# Patient Record
Sex: Male | Born: 1935 | Race: White | Hispanic: No | Smoking: Former smoker
Health system: Southern US, Community
[De-identification: ages and names within clinical notes are randomized; demographics above are authoritative.]

## PROBLEM LIST (undated history)

## (undated) DIAGNOSIS — C61 Malignant neoplasm of prostate: Secondary | ICD-10-CM

## (undated) DIAGNOSIS — J189 Pneumonia, unspecified organism: Secondary | ICD-10-CM

## (undated) DIAGNOSIS — J449 Chronic obstructive pulmonary disease, unspecified: Secondary | ICD-10-CM

## (undated) DIAGNOSIS — E785 Hyperlipidemia, unspecified: Secondary | ICD-10-CM

## (undated) DIAGNOSIS — C801 Malignant (primary) neoplasm, unspecified: Secondary | ICD-10-CM

## (undated) DIAGNOSIS — E7800 Pure hypercholesterolemia, unspecified: Secondary | ICD-10-CM

## (undated) DIAGNOSIS — H9190 Unspecified hearing loss, unspecified ear: Secondary | ICD-10-CM

## (undated) DIAGNOSIS — N3281 Overactive bladder: Secondary | ICD-10-CM

## (undated) DIAGNOSIS — I10 Essential (primary) hypertension: Secondary | ICD-10-CM

## (undated) DIAGNOSIS — E236 Other disorders of pituitary gland: Secondary | ICD-10-CM

## (undated) DIAGNOSIS — I639 Cerebral infarction, unspecified: Secondary | ICD-10-CM

## (undated) HISTORY — PX: BLADDER SURGERY: SHX569

## (undated) HISTORY — PX: CATARACT EXTRACTION: SUR2

## (undated) HISTORY — DX: Unspecified hearing loss, unspecified ear: H91.90

## (undated) HISTORY — DX: Cerebral infarction, unspecified: I63.9

## (undated) HISTORY — PX: TRANSURETHRAL RESECTION OF PROSTATE: SHX73

## (undated) HISTORY — PX: EYE SURGERY: SHX253

---

## 2002-05-02 ENCOUNTER — Emergency Department: Admit: 2002-05-02 | Payer: Self-pay | Source: Emergency Department | Admitting: Emergency Medicine

## 2014-06-09 ENCOUNTER — Inpatient Hospital Stay: Payer: Medicare Other | Admitting: Internal Medicine

## 2014-06-09 ENCOUNTER — Emergency Department: Payer: Medicare Other

## 2014-06-09 ENCOUNTER — Inpatient Hospital Stay: Payer: Medicare Other

## 2014-06-09 ENCOUNTER — Inpatient Hospital Stay
Admission: EM | Admit: 2014-06-09 | Discharge: 2014-06-17 | DRG: 871 | Disposition: A | Discharge status: Skilled Nursing Facility | Payer: Medicare Other | Attending: Internal Medicine | Admitting: Internal Medicine

## 2014-06-09 ENCOUNTER — Encounter
Admission: EM | Disposition: A | Discharge status: Skilled Nursing Facility | Payer: Self-pay | Source: Home / Self Care | Attending: Internal Medicine

## 2014-06-09 DIAGNOSIS — E785 Hyperlipidemia, unspecified: Secondary | ICD-10-CM | POA: Diagnosis present

## 2014-06-09 DIAGNOSIS — J9690 Respiratory failure, unspecified, unspecified whether with hypoxia or hypercapnia: Secondary | ICD-10-CM | POA: Diagnosis present

## 2014-06-09 DIAGNOSIS — J851 Abscess of lung with pneumonia: Secondary | ICD-10-CM

## 2014-06-09 DIAGNOSIS — D72829 Elevated white blood cell count, unspecified: Secondary | ICD-10-CM

## 2014-06-09 DIAGNOSIS — G9389 Other specified disorders of brain: Secondary | ICD-10-CM

## 2014-06-09 DIAGNOSIS — A491 Streptococcal infection, unspecified site: Secondary | ICD-10-CM

## 2014-06-09 DIAGNOSIS — J168 Pneumonia due to other specified infectious organisms: Secondary | ICD-10-CM

## 2014-06-09 DIAGNOSIS — A419 Sepsis, unspecified organism: Principal | ICD-10-CM | POA: Diagnosis present

## 2014-06-09 DIAGNOSIS — J90 Pleural effusion, not elsewhere classified: Secondary | ICD-10-CM

## 2014-06-09 DIAGNOSIS — E876 Hypokalemia: Secondary | ICD-10-CM | POA: Diagnosis present

## 2014-06-09 DIAGNOSIS — J189 Pneumonia, unspecified organism: Secondary | ICD-10-CM

## 2014-06-09 DIAGNOSIS — D638 Anemia in other chronic diseases classified elsewhere: Secondary | ICD-10-CM | POA: Diagnosis present

## 2014-06-09 DIAGNOSIS — J9691 Respiratory failure, unspecified with hypoxia: Secondary | ICD-10-CM | POA: Diagnosis present

## 2014-06-09 DIAGNOSIS — B954 Other streptococcus as the cause of diseases classified elsewhere: Secondary | ICD-10-CM | POA: Diagnosis present

## 2014-06-09 DIAGNOSIS — J154 Pneumonia due to other streptococci: Secondary | ICD-10-CM | POA: Diagnosis present

## 2014-06-09 DIAGNOSIS — R410 Disorientation, unspecified: Secondary | ICD-10-CM

## 2014-06-09 DIAGNOSIS — J869 Pyothorax without fistula: Secondary | ICD-10-CM | POA: Diagnosis present

## 2014-06-09 DIAGNOSIS — N179 Acute kidney failure, unspecified: Secondary | ICD-10-CM | POA: Diagnosis present

## 2014-06-09 HISTORY — DX: Hyperlipidemia, unspecified: E78.5

## 2014-06-09 LAB — COMPREHENSIVE METABOLIC PANEL
ALT: 21 U/L (ref 0–55)
AST (SGOT): 32 U/L (ref 5–34)
Albumin/Globulin Ratio: 0.3 — ABNORMAL LOW (ref 0.9–2.2)
Albumin: 1.6 g/dL — ABNORMAL LOW (ref 3.5–5.0)
Alkaline Phosphatase: 144 U/L — ABNORMAL HIGH (ref 38–106)
Anion Gap: 16 — ABNORMAL HIGH (ref 5.0–15.0)
BUN: 39.8 mg/dL — ABNORMAL HIGH (ref 9.0–28.0)
Bilirubin, Total: 1.9 mg/dL — ABNORMAL HIGH (ref 0.2–1.2)
CO2: 22 mEq/L (ref 22–29)
Calcium: 8 mg/dL (ref 7.9–10.2)
Chloride: 102 mEq/L (ref 100–111)
Creatinine: 1.1 mg/dL (ref 0.7–1.3)
Globulin: 4.8 g/dL — ABNORMAL HIGH (ref 2.0–3.6)
Glucose: 115 mg/dL — ABNORMAL HIGH (ref 70–100)
Potassium: 4.2 mEq/L (ref 3.5–5.1)
Protein, Total: 6.4 g/dL (ref 6.0–8.3)
Sodium: 140 mEq/L (ref 136–145)

## 2014-06-09 LAB — MAN DIFF ONLY
Band Neutrophils Absolute: 3.57 10*3/uL — ABNORMAL HIGH (ref 0.00–1.00)
Band Neutrophils: 19 %
Basophils Absolute Manual: 0 10*3/uL (ref 0.00–0.20)
Basophils Manual: 0 %
Eosinophils Absolute Manual: 0 10*3/uL (ref 0.00–0.70)
Eosinophils Manual: 0 %
Lymphocytes Absolute Manual: 0.56 10*3/uL (ref 0.50–4.40)
Lymphocytes Manual: 3 %
Monocytes Absolute: 0.19 10*3/uL (ref 0.00–1.20)
Monocytes Manual: 1 %
Neutrophils Absolute Manual: 14.46 10*3/uL — ABNORMAL HIGH (ref 1.80–8.10)
Nucleated RBC: 0 /100 WBC (ref 0–1)
Segmented Neutrophils: 77 %

## 2014-06-09 LAB — URINALYSIS WITH MICROSCOPIC
Glucose, UA: NEGATIVE
Ketones UA: NEGATIVE
Leukocyte Esterase, UA: NEGATIVE
Nitrite, UA: NEGATIVE
Specific Gravity UA: >1.035 — AB (ref 1.001–1.035)
Urine pH: 5.5 (ref 5.0–8.0)
Urobilinogen, UA: 2 mg/dL (ref 0.2–2.0)

## 2014-06-09 LAB — CBC AND DIFFERENTIAL
Hematocrit: 33.1 % — ABNORMAL LOW (ref 42.0–52.0)
Hgb: 11.2 g/dL — ABNORMAL LOW (ref 13.0–17.0)
MCH: 28.6 pg (ref 28.0–32.0)
MCHC: 33.8 g/dL (ref 32.0–36.0)
MCV: 84.4 fL (ref 80.0–100.0)
MPV: 9.4 fL (ref 9.4–12.3)
Platelets: 641 10*3/uL — ABNORMAL HIGH (ref 140–400)
RBC: 3.92 10*6/uL — ABNORMAL LOW (ref 4.70–6.00)
RDW: 16 % — ABNORMAL HIGH (ref 12–15)
WBC: 18.78 10*3/uL — ABNORMAL HIGH (ref 3.50–10.80)

## 2014-06-09 LAB — CELL MORPHOLOGY
Cell Morphology: ABNORMAL — AB
Platelet Estimate: INCREASED — AB

## 2014-06-09 LAB — TROPONIN I: Troponin I: <0.01 ng/mL (ref 0.00–0.09)

## 2014-06-09 LAB — LACTIC ACID, PLASMA
Lactic Acid: 2.6 mmol/L — ABNORMAL HIGH (ref 0.5–2.2)
Lactic Acid: 1.6 mmol/L (ref 0.5–2.2)

## 2014-06-09 LAB — PT AND APTT
PT INR: 1.3
PT: 15.9 s — ABNORMAL HIGH (ref 12.6–15.0)
PTT: 34 s (ref 23–37)

## 2014-06-09 LAB — B-TYPE NATRIURETIC PEPTIDE: B-Natriuretic Peptide: 135.1 pg/mL — ABNORMAL HIGH (ref 0.0–100.0)

## 2014-06-09 LAB — GFR: EGFR: >60

## 2014-06-09 SURGERY — CHEST TUBE PLACEMENT
Laterality: Left

## 2014-06-09 MED ORDER — FENTANYL CITRATE 0.05 MG/ML IJ SOLN
INTRAMUSCULAR | Status: AC
Start: 2014-06-09 — End: 2014-06-09
  Administered 2014-06-09: 75 ug
  Filled 2014-06-09: qty 4

## 2014-06-09 MED ORDER — LEVOFLOXACIN IN D5W 750 MG/150ML IV SOLN
750.0000 mg | Freq: Every day | INTRAVENOUS | Status: DC
Start: 2014-06-10 — End: 2014-06-12
  Administered 2014-06-10 – 2014-06-11 (×2): 750 mg via INTRAVENOUS
  Filled 2014-06-09 (×2): qty 150

## 2014-06-09 MED ORDER — DIPHENHYDRAMINE HCL 50 MG/ML IJ SOLN
INTRAMUSCULAR | Status: AC
Start: 2014-06-09 — End: 2014-06-09
  Administered 2014-06-09: 25 mg via INTRAVENOUS
  Filled 2014-06-09: qty 1

## 2014-06-09 MED ORDER — SODIUM CHLORIDE 0.9 % IV MBP
4.5000 g | Freq: Three times a day (TID) | INTRAVENOUS | Status: DC
Start: 2014-06-10 — End: 2014-06-10
  Administered 2014-06-10 (×2): 4.5 g via INTRAVENOUS
  Filled 2014-06-09 (×4): qty 20

## 2014-06-09 MED ORDER — LIDOCAINE HCL (PF) 1 % IJ SOLN
INTRAMUSCULAR | Status: AC
Start: 2014-06-09 — End: 2014-06-09
  Filled 2014-06-09: qty 30

## 2014-06-09 MED ORDER — LEVOFLOXACIN IN D5W 750 MG/150ML IV SOLN
750.0000 mg | Freq: Once | INTRAVENOUS | Status: AC
Start: 2014-06-09 — End: 2014-06-09
  Administered 2014-06-09: 750 mg via INTRAVENOUS
  Filled 2014-06-09: qty 150

## 2014-06-09 MED ORDER — FENTANYL CITRATE 0.05 MG/ML IJ SOLN
25.0000 ug | INTRAMUSCULAR | Status: DC | PRN
Start: 2014-06-09 — End: 2014-06-14
  Filled 2014-06-09: qty 2

## 2014-06-09 MED ORDER — SODIUM CHLORIDE 0.9 % IV SOLN
500.0000 mg | Freq: Once | INTRAVENOUS | Status: AC
Start: 2014-06-09 — End: 2014-06-09
  Administered 2014-06-09: 500 mg via INTRAVENOUS
  Filled 2014-06-09: qty 500

## 2014-06-09 MED ORDER — SODIUM CHLORIDE 0.9 % IV BOLUS
500.0000 mL | Freq: Once | INTRAVENOUS | Status: AC
Start: 2014-06-09 — End: 2014-06-09
  Administered 2014-06-09: 500 mL via INTRAVENOUS

## 2014-06-09 MED ORDER — SODIUM CHLORIDE 0.9 % IV MBP
4.5000 g | Freq: Once | INTRAVENOUS | Status: AC
Start: 2014-06-09 — End: 2014-06-09
  Administered 2014-06-09: 4.5 g via INTRAVENOUS
  Filled 2014-06-09: qty 20

## 2014-06-09 MED ORDER — INFLUENZA VAC SPLIT HIGH-DOSE 0.5 ML IM SUSY
0.5000 mL | PREFILLED_SYRINGE | INTRAMUSCULAR | Status: DC | PRN
Start: 2014-06-09 — End: 2014-06-17
  Filled 2014-06-09: qty 0.5

## 2014-06-09 MED ORDER — IODIXANOL 320 MG/ML IV SOLN
100.0000 mL | Freq: Once | INTRAVENOUS | Status: AC | PRN
Start: 2014-06-09 — End: 2014-06-09
  Administered 2014-06-09: 100 mL via INTRAVENOUS

## 2014-06-09 MED ORDER — DIPHENHYDRAMINE HCL 50 MG/ML IJ SOLN
25.0000 mg | Freq: Once | INTRAMUSCULAR | Status: AC
Start: 2014-06-09 — End: 2014-06-09

## 2014-06-09 MED ORDER — VANCOMYCIN HCL 1000 MG IV SOLR
2000.0000 mg | Freq: Once | INTRAVENOUS | Status: DC
Start: 2014-06-09 — End: 2014-06-09
  Administered 2014-06-09: 2000 mg via INTRAVENOUS
  Filled 2014-06-09: qty 2000

## 2014-06-09 MED ORDER — SODIUM CHLORIDE 0.9 % IV BOLUS
1000.0000 mL | INTRAVENOUS | Status: DC | PRN
Start: 2014-06-09 — End: 2014-06-14

## 2014-06-09 MED ORDER — SODIUM CHLORIDE 0.9 % IV MBP
1.0000 g | Freq: Once | INTRAVENOUS | Status: AC
Start: 2014-06-09 — End: 2014-06-09
  Administered 2014-06-09: 1 g via INTRAVENOUS
  Filled 2014-06-09: qty 100
  Filled 2014-06-09: qty 1000

## 2014-06-09 MED ORDER — NALOXONE HCL 0.4 MG/ML IJ SOLN
INTRAMUSCULAR | Status: DC
Start: 2014-06-09 — End: 2014-06-09
  Filled 2014-06-09: qty 1

## 2014-06-09 MED ORDER — LORAZEPAM 2 MG/ML IJ SOLN
2.0000 mg | INTRAMUSCULAR | Status: DC | PRN
Start: 2014-06-09 — End: 2014-06-17
  Administered 2014-06-11: 2 mg via INTRAVENOUS
  Filled 2014-06-09 (×2): qty 1

## 2014-06-09 MED ORDER — LEVOFLOXACIN IN D5W 750 MG/150ML IV SOLN
750.0000 mg | Freq: Every day | INTRAVENOUS | Status: DC
Start: 2014-06-11 — End: 2014-06-09

## 2014-06-09 MED ORDER — FENTANYL CITRATE 0.05 MG/ML IJ SOLN
75.0000 ug | Freq: Once | INTRAMUSCULAR | Status: AC
Start: 2014-06-09 — End: 2014-06-09
  Administered 2014-06-09: 75 ug via INTRAVENOUS

## 2014-06-09 MED ORDER — VANCOMYCIN HCL 1000 MG IV SOLR
1000.0000 mg | Freq: Two times a day (BID) | INTRAVENOUS | Status: DC
Start: 2014-06-10 — End: 2014-06-12
  Administered 2014-06-10 – 2014-06-12 (×5): 1000 mg via INTRAVENOUS
  Filled 2014-06-09 (×5): qty 1000

## 2014-06-09 NOTE — H&P (Signed)
BRIEF VIR H&P    Date Time: 06/09/2014 4:50 PM    PROCEDURALIST COMMENTS BELOW:   Loculated left pleural effusion with air containing collection left base (empyema versus abscess)    INDICATIONS:   Procedure(s):  CHEST TUBE PLACEMENT    Preoperative Diagnosis:   Pre-Op Diagnosis Codes:     * Abscess of left lung without pneumonia, unspecified part of lung [J85.2]      PAST MEDICAL HISTORY:     Past Medical History   Diagnosis Date   . Hyperlipemia        PAST SURGICAL HISTORY     Past Surgical History   Procedure Laterality Date   . Cataract extraction           REVIEW OF SYSTEMS REVIEWED:   YES  ( x )      CURRENT MEDICATIONS     Current Facility-Administered Medications   Medication Dose Route Frequency Last Rate Last Dose   . diphenhydrAMINE (BENADRYL) 50 MG/ML injection           . fentaNYL (SUBLIMAZE) 0.05 MG/ML injection           . piperacillin-tazobactam (ZOSYN) 4.5 g in sodium chloride 0.9 % 100 mL IVPB mini-bag plus  4.5 g Intravenous Once        And   . levofloxacin (LEVAQUIN) 750mg  in D5W IVPB (premix)  750 mg Intravenous Once 100 mL/hr at 06/09/14 1545 750 mg at 06/09/14 1545   . [START ON 06/10/2014] levofloxacin (LEVAQUIN) 750mg  in D5W IVPB (premix)  750 mg Intravenous Daily       . lidocaine (XYLOCAINE) 1 % injection           . naloxone (NARCAN) 0.4 MG/ML injection           . piperacillin-tazobactam (ZOSYN) 4.5 g in sodium chloride 0.9 % 100 mL IVPB mini-bag plus  4.5 g Intravenous Q8H SCH       . vancomycin (VANCOCIN) 2,000 mg in sodium chloride 0.9 % 500 mL IVPB  2,000 mg Intravenous Once in ED           CURRENT MEDICATIONS REVIEWED   YES  (x  )      ALLERGIES:   No Known Allergies    ALLERGIES REVIEWED:   YES ( x )     PREVIOUS REACTION TO SEDATION MEDICATIONS   NO (x )   YES ( )      PHYSICAL EXAM     AIRWAY CLASSIFICATION:  CLASS I   (  )     CLASS II  ( x  )    CLASS III  (  )     CLASS IV  (  )  INTUBATED (  )    CARDIAC:   ( x )  RRR  (  )  IRREG  (  )  MURMUR    LUNGS:   (  )   CLEAR  (x )  DIMINISHED    (x  ) LEFT   (  )  RIGHT  (  )  ABSENT          (  ) LEFT   (  )  RIGHT  (  )  TUBES            (  ) LEFT   (  )  RIGHT      ABDOMEN:   Soft NT    NEURO:   AAO x3  LABS:     Lab Results   Component Value Date/Time    WBC 18.78* 06/09/2014 01:08 PM    HCT 33.1* 06/09/2014 01:08 PM    INR 1.3 06/09/2014 01:08 PM    PT 15.9* 06/09/2014 01:08 PM    PTT 34 06/09/2014 01:08 PM    BUN 39.8* 06/09/2014 01:07 PM    CREAT 1.1 06/09/2014 01:07 PM    GLU 115* 06/09/2014 01:07 PM    K 4.2 06/09/2014 01:07 PM       ASA PHYSICAL STATUS   (  )  ASA 1   HEALTHY PATIENT  (  )  ASA 2   MILD SYSTEMIC ILLNESS  ( x )  ASA 3   SYSTEMIC DISEASE, NOT INCAPACITATING  (  )  ASA 4   SEVERE SYSTEMIC DISEASE, IS CONSTANT THREAT TO  LIFE  (  )  ASA 5   MORIBUND CONDITION, NOT EXPECTED TO LIVE >24 HOURS            IRRESPECTIVE OF PROCEDURE  (  )  E           EMERGENCY PROCEDURE     PLANNED SEDATION:   ( x ) NO SEDATION  (  ) MODERATE SEDATION  (  ) DEEP SEDATION WITH ANESTHESIA      CONCLUSION:   PATIENT HAS BEEN REASSESSED IMMEDIATELY PRIOR TO THE PROCEDURE   AND IS AN APPROPRIATE CANDIDATE FOR THE PLANNED SEDATION AND   PROCEDURE.  RISKS, BENEFITS AND ALTERNATIVES TO THE PLANNED   PROCEDURE AND SEDATION HAVE BEEN EXPLAINED TO THE PATIENT   OR GUARDIAN/FAMILY THOROUGHLY. MAJOR RISKS RE INFECTION, BLEEDING, INTERNAL ORGAN INJURY, AND RECURRENCE WERE DISCUSSED.    (x)  YES  (  )  EMERGENCY CONSENT     Signed by Vickki Muff, M.D.    Interventional Radiologist  [facility]- Division of Vascular & Interventional Radiology    Contact Numbers:  Regular business hours (8A-5P M-F):  [facility]:  [phone]  [facility]:  [phone]    After hours:    Answering service:  [phone]

## 2014-06-09 NOTE — Brief Op Note (Addendum)
BRIEF OP NOTE    Date Time: 06/09/2014 5:47 PM    Patient Name:   Brian Trevino    Date of Operation:   06/09/2014    Providers Performing:   Surgeon(s):  Vickki Muff, MD    Assistant (s): Milinda Hirschfeld, RCIS    Operative Procedure:   Procedure(s):  CHEST TUBE PLACEMENT    Preoperative Diagnosis:   Pre-Op Diagnosis Codes:     * Abscess of left lung without pneumonia, unspecified part of lung [J85.2]    Postoperative Diagnosis:   10  FRENCH NON LOCKING PIGTAIL DRAIN PLACED IN ANTEROLATERAL UPPER LOCULATED LEFT PLEURAL EFFUSIN UNDER CT GUIDANCE  12 FRENCH LOCKING PIGTAIL DRAIN PLACED IN POSTERIOR LOWER COLLECTION    Anesthesia:   ( x ) FENTANYL 75 mcg IVP  (  )VERSED   ( x ) LOCAL  (  ) GENERAL ANESTHESIA (DEPT OF ANESTHESIOLOGY) )  BENADRYL 25 MG IVP    Estimated Blood Loss:    NONE    CONTRAST DOSE   NONE    RADIATION DOSE   DONE UNDER CT FLUORO    Drains:     10  FRENCH NON LOCKING PIGTAIL DRAIN PLACED IN ANTEROLATERAL UPPER LOCULATED LEFT PLEURAL EFFUSIN UNDER CT GUIDANCE  12 FRENCH LOCKING PIGTAIL DRAIN PLACED IN POSTERIOR LOWER COLLECTION    Specimens:   ( X )  SENT  (  )  NONE    Findings:   LOCULATED COLLECTION EXTENDING INTO UPPER INTERLOBAR FISSURE RETURNED BROWNISH PURULENT MATERIAL.  60 ML ASPIRATED AND SENT FOR CULTURE.  LOCULATION AT POSTERIOR BASE RETURNED SIMILAR FLUID AND MOSTLY AIR. 5 ML ASPIRATED AND SENT FOR CULTURE.    Complications:   NONE      Signed by: Vickki Muff, MD                                                                              LO IVR

## 2014-06-09 NOTE — Consults (Signed)
Service Date: 06/09/2014     Patient Type: I     CONSULTING PHYSICIAN: Nel Stoneking A. Janalyn Rouse, MD     REFERRING PHYSICIAN: Eunice Blase PA; Dr. Thornton Papas, M.D.     REASON FOR CONSULTATION:  Bilateral pneumonia, sepsis, lung abscess.     HISTORY OF PRESENT ILLNESS:  [name] is an 80 year old male with past medical history  significant for hyperlipidemia, who is visiting from [place].  He was not  feeling well for the last 1 week or so, he was taking some medication over  in [place] and he had a fall in [place] then he came over here with worsening  shortness of breath and had another fall at daughter's house.  Admits to  have undocumented fever.  History of some runny nose.  He was very hypoxic.   He is very dyspneic and he had a CT angiogram which revealed bilateral  pneumonia with large air-fluid collection in the left lower lobe  representing lung abscess.  Cervical CT revealed degenerative changes.  He  is very lethargic and weak.     REVIEW OF SYSTEMS:  Denies any chest pain, hematemesis, hemoptysis, melena, night sweats, any  GU symptoms, any significant weight loss.  Other review of systems is  noncontributory.     ALLERGIES:  No known drug allergies.     PAST MEDICAL HISTORY:  Hyperlipidemia.     SOCIAL HISTORY:  Every day smoker, no significant drinking.  He is visiting from [place].     MEDICATIONS:  He was on aspirin, Lipitor, Tricor, sulindac as an outpatient.     FAMILY HISTORY:  Noncontributory.     PHYSICAL EXAMINATION:  GENERAL:  [name] is an 80 year old male who looks sick and  lethargic and septic.  VITAL SIGNS:  Temperature current is 96.6, pulse 103, respiratory rate 34,  blood pressure 112/49.  HEENT:  Pallor negative.  Anicteric.  NECK:  Supple.  There is no thrush.  Dry mucous membranes.  RESPIRATORY:  Decreased breath sounds, especially at the left lung base.   Bilateral scattered crackles.  CARDIOVASCULAR:  S1, S2, tachycardic.  ABDOMEN:  Obese, bowel sounds are  positive.  NEUROLOGIC:  Alert, awake, moves all extremities.     LABORATORY AND DIAGNOSTIC DATA:     WBC count is 18.7, hemoglobin 11.2, hematocrit 33.1, platelets 641,  neutrophils 77, bands 19.  Glucose 115, BUN 39.8, creatinine 1.1, sodium  140, potassium 4.2, chloride 102, CO2 22.  AST 32, ALT 21.  Alkaline  phosphatase is 144.  BNP 135.1, lactic acid 2.6.  INR 1.3.       Urinalysis:  Trace protein, negative nitrites, negative leukocyte esterase,  0 to 5 WBCs, but 26 to 50 RBCs.       CT angiogram revealed left lower lobe abscess and bilateral pneumonia.     ASSESSMENT:  [name] is an 80 year old male with:  1.  Systemic inflammatory response syndrome, sepsis.  2.  Bilateral pneumonia.  3.  Respiratory failure  4.  Lung abscess.  5.  Hyperlipidemia.     RECOMMENDATIONS:  I would like to suggest the following approach:  1.  Zosyn 4.5 grams IV every 8 hours.  2.  Levaquin 750 mg IV daily.  3.  Vancomycin 1 g IV every 12 hours.  4.  Interventional radiology evaluation for possible chest tube placement.  5.  Correction of electrolytes, and intravenous hydration.  6.  Urinary Legionella and Streptococcus pneumoniae antigen.  7.  Chlamydia and Mycoplasma serologies.  8.  Influenza test.  9.  Repeat blood cultures if he spikes more than 100.5.  10.  Discussed with Dr. Girard Cooter  11.  Discussed with the patient's daughter.       I will follow this patient closely with you.     Thank you, Dr. Girard Cooter and [place], for involving me in the care of [name].           D:  06/09/2014 20:43 PM by Dr. Fredderick Phenix A. Janalyn Rouse, MD [phone])  T:  06/09/2014 21:31 PM by Effie Berkshire      (Everlean Cherry: [id number]) (Doc ID: [id number])

## 2014-06-09 NOTE — Plan of Care (Signed)
Problem: Safety  Goal: Patient will be free from injury during hospitalization  Outcome: Progressing  Bed locked and in lowest position. All tubing secured. Call bell within reach. Bedside table and urinal next to patient. Pt verbalized understanding of fall precautions

## 2014-06-09 NOTE — ED Notes (Signed)
IMelvern Sample, DO, have personally seen and examined this patient, and have fully participated in his care.     Additional findings: 80 year old male with severe shortness of breath.  Patient flew in from [place] 2 days ago and has had increasing shortness of breath since then.  His been having a cough with some sputum production.  No fever.  Questionable history of congestive heart failure.  Patient was hypoxic on arrival.  Physical examination: Patient is alert and oriented.  Severe uncomfortable secondary to dyspnea.  Heart tachycardic with no murmurs or clicks.  Lungs diminished breath sounds in all fields, worse on the left.  No edema in the lower extremities.  Abdomen soft, nontender.    Focused Bedside ultrasound revealed an ectatic inferior vena cava with no lapse on respirations.  Concern for fluid overload.     Melvern Sample, DO  06/09/14 1525

## 2014-06-09 NOTE — H&P (Addendum)
[facility]- Critical Care Note      ADMISSION- HISTORY AND PHYSICAL EXAM      Date Time: 06/09/2014 6:47 PM  Patient Name: Brian Trevino  Attending Physician: Lawernce Ion, MD  Primary Care Physician: Patsy Lager, MD  Location/Room: IC12/IC12-A     Assessment:   Problem List:   Patient Active Problem List   Diagnosis   . Respiratory failure     Community-acquired pneumonia complicated by empyema status post chest tube placement.     Plan:     Neuro: Per family, patient's confusion is now much improved. Incidental finding of possible sella mass? In head ct.  Will need to follow up as outpt .     Resp: respiratory failure from Community-acquired pneumonia complicated by empyema.  Appreciate IR chest tube placement.  Chest x-ray ordered for the morning.  Actively draining purulent material.  If lung does not reexpand, patient may need decortication.     Nutrition: Allow clear liquid diet    Infectious Disease (ID): Community-acquired pneumonia complicated by empyema.  Appreciate Dr. Myrtis Ser evaluation recommendation.  Continue with broad-spectrum antibiotics. purulent material sent for cultured and Gram stain.            Hem/Onc: Anemia.  No signs of active bleed    Renal /Fluid, Electrolytes : Acute kidney injury, add Mucomyst and gentle hydration given patient exposed to contrast for Chest CT scan.       Prophylaxis:   GI Prophylaxis:  +  VTE Prophylaxis:+      Code Status: full code  I have personally reviewed the patient's history and 24 hour interval events, along with vitals, labs, radiology images, and nurses report.      Discussed with patient's daughter and family at the bedside and answered their questions.     Chief Complaint / Primary Reason for ICU evaluation :      Chief Complaint   Patient presents with   . Shortness of Breath          History of Presenting Illness:   Brian Trevino is a 80 y.o. male  history of hyperlipidemia visiting from [place] who presents to the hospital with  respiratory failure.  Patient arrived via plane 2 days prior to admission.  He had worsening shortness of breath approximately one month prior with increasing productive sputum.  He states that he went to his primary care physician who prescribed codeine.  He had not been exposed to antibiotics these past few months.  Patient noted fever and chills.  Family noted patient was confused with increasing sputum production.  When patient's told family.  He was unable to brief.  They called 911.  She was noted to be in respiratory distress and hypoxic.  Chest CT scan indicated large lung abscess.  Patient underwent chest tube placement by interventional radiology with good success.  Approximately of pus came from chest tube.   Patient state that he felt better after chest tube placement.  Improvement in shortness of breath.  He has mild right diaphragmatic pain from cough.       Past Medical History:     Past Medical History   Diagnosis Date   . Hyperlipemia        Available old records reviewed, including:  None available, pt from [place]    Past Surgical History:     Past Surgical History   Procedure Laterality Date   . Cataract extraction  Family History:   History reviewed. No pertinent family history.    Social History:     History     Social History   . Marital Status: Widowed     Spouse Name: N/A     Number of Children: N/A   . Years of Education: N/A     Social History Main Topics   . Smoking status: Current Every Day Smoker -- 69 years     Types: Pipe   . Smokeless tobacco: Not on file   . Alcohol Use: No   . Drug Use: No   . Sexual Activity: Not on file     Other Topics Concern   . Not on file     Social History Narrative   . No narrative on file       Allergies:   No Known Allergies        Medications:     Prescriptions prior to admission   Medication Sig   . aspirin 81 MG chewable tablet Chew 81 mg by mouth daily.   Marland Kitchen atorvastatin (LIPITOR) 10 MG tablet Take 10 mg by mouth daily.   . fenofibrate  (TRICOR) 54 MG tablet Take 54 mg by mouth daily.   . sulindac (CLINORIL) 200 MG tablet Take 200 mg by mouth 2 (two) times daily.       Current Inpatient :  Current Facility-Administered Medications   Medication Dose Route Frequency   . [START ON 06/11/2014] levofloxacin  750 mg Intravenous Daily   . piperacillin-tazobactam  4.5 g Intravenous Once   . piperacillin-tazobactam  4.5 g Intravenous Q8H SCH   . [START ON 06/10/2014] vancomycin  1,000 mg Intravenous Q12H SCH   . [DISCONTINUED] vancomycin  2,000 mg Intravenous Once in ED       Home Medications :     Prior to Admission medications    Medication Sig Start Date End Date Taking? Authorizing Provider   aspirin 81 MG chewable tablet Chew 81 mg by mouth daily.   Yes [provider]   atorvastatin (LIPITOR) 10 MG tablet Take 10 mg by mouth daily.   Yes [provider]   fenofibrate (TRICOR) 54 MG tablet Take 54 mg by mouth daily.   Yes [provider]   sulindac (CLINORIL) 200 MG tablet Take 200 mg by mouth 2 (two) times daily.   Yes [provider]            Review of Systems:   All other systems were reviewed and are negative except per HPI.     Physical Exam:     Filed Vitals:    06/09/14 1830   BP: 112/52   Pulse: 99   Temp:    Resp: 28   SpO2: 93%       Intake and Output Summary (Last 24 hours) at Date Time    Intake/Output Summary (Last 24 hours) at 06/09/14 1847  Last data filed at 06/09/14 1600   Gross per 24 hour   Intake      0 ml   Output     15 ml   Net    -15 ml       General Appearance:  Comfortable in bed post chest tube placement  Mental status:  Alert and oriented to person, place and time  Neuro: no focal neurologic deficit, able to move all 4 extremities equally  Neck:supple, no significant adenopathy  Lungs: rhonchi left lower lung field  Cardiac: ns1, ns2, 2/6 SEM  at apex, no rub/gallop  Abdomen:  Obese, nontender, nondistended  Extremities: no lower extremity edema  Skin: no rash, 2 left sided chest tube  site intact and dressing clean      Labs:       Labs (last 72 hours):  Recent Labs      06/09/14   1308   WBC  18.78*   HEMOGLOBIN  11.2*   HEMATOCRIT  33.1*     Recent Labs      06/09/14   1308   PT  15.9*   PT INR  1.3   PTT  34    Recent Labs      06/09/14   1307   SODIUM  140   POTASSIUM  4.2   CHLORIDE  102   CO2  22   BUN  39.8*   CREATININE  1.1   GLUCOSE  115*   CALCIUM  8.0                   Radiology / Imaging:     Imaging personally reviewed by me and agree with radiology report including:       Chest ct PE study    06/09/2014    "1. Exam limited by respiratory motion. No evidence of pulmonary embolus  or aortic dissection.  2. Bilateral pneumonia with large air-fluid collection in the left lower  lobe, likely representing lung abscess.  3. Other findings as above."      Head CT    06/09/2014    "1. Volume loss, mild chronic small vessel ischemic disease.  2. Slight expansion of the sella turcica with soft tissue within it.  Findings could indicate a sella turcica mass. This if clinically  indicated could be better evaluated with an MRI.  3. Inflammatory appearing changes in the paranasal sinuses."        Other ICU Data   Vent Settings:    Vent Settings  FiO2: 100 %      Signed by: Lawernce Ion  cc:Pcp, Kathreen Cosier, MD

## 2014-06-09 NOTE — Progress Notes (Signed)
Transported to ER, report given to RN. Informed to attach CT's to suction due to air leak. VS stable, denies respiratory distress. Denies pain

## 2014-06-09 NOTE — Plan of Care (Signed)
Infectious Disease   Full Consult Dictated    06/09/2014   Brian Trevino BJY:[id number]:[id number] is a 80 y.o. male, with Sepsis, Bilateral pneumonia, Lung abscess      Recommendations:  I would like to suggest the following approach:   Interventional radiology evaluation for possible chest tube    Therapy     Zosyn   Levaquin   Vancomycin    LABS:      CBC with Diff   CMP   Urine analysis & Culture   Urinary Legionella and strep Pneumonia antigen   Chlamydia antibody panel   Mycoplasma IgM, IgG   Influenza test   Repeat Blood cultures if Spike more than 100.5.   Will Follow the serologies.     I will follow this patient Closely with you, Thank you for Involving me in care of  Brian Trevino, M.D.,FACP  06/09/2014  4:14 PM

## 2014-06-09 NOTE — Progress Notes (Signed)
Tolerated CT guided chest tube insertions well. VS stable. Denies pain. Fluid from both sites sent for culture.Marland Kitchen

## 2014-06-09 NOTE — ED Provider Notes (Signed)
Physician/Midlevel provider first contact with patient: 06/09/14 1255           History     Chief Complaint   Patient presents with   . Shortness of Breath     HPI Comments: 80 y.o male here with family for SOB, productive cough and multiple fall.  Patient travel from [place] here a few days ago.  When he was still in [place] patient had have multiple fall in the shower and worsening mentation.  Since arriving here daughter have notice SOB with exertion, productive cough, generalized weakness and decrease in his mentation.    The history is provided by the patient (daughter). No language interpreter was used.            Past Medical History   Diagnosis Date   . Hyperlipemia        Past Surgical History   Procedure Laterality Date   . Cataract extraction         History reviewed. No pertinent family history.    Social  History   Substance Use Topics   . Smoking status: Current Every Day Smoker -- 69 years     Types: Pipe   . Smokeless tobacco: Not on file   . Alcohol Use: No       .     No Known Allergies    Current Discharge Medication List      CONTINUE these medications which have NOT CHANGED    Details   aspirin 81 MG chewable tablet Chew 81 mg by mouth daily.      atorvastatin (LIPITOR) 10 MG tablet Take 10 mg by mouth daily.      fenofibrate (TRICOR) 54 MG tablet Take 54 mg by mouth daily.      sulindac (CLINORIL) 200 MG tablet Take 200 mg by mouth 2 (two) times daily.              Review of Systems   Constitutional: Positive for fever and fatigue.   HENT: Negative.    Eyes: Negative.    Respiratory: Positive for cough and shortness of breath.    Cardiovascular: Positive for chest pain.   Gastrointestinal: Negative.    Endocrine: Negative.    Genitourinary: Negative.    Musculoskeletal: Negative.    Skin: Negative.    Allergic/Immunologic: Negative.    Neurological: Negative.    Hematological: Negative.    Psychiatric/Behavioral: Negative.    All other systems reviewed and are negative.      Physical Exam    BP:  175/75 mmHg, Heart Rate: (!) 116, Temp: 98.5 F (36.9 C), Resp Rate: (!) 24, SpO2: (!) 87 %, Weight: 92.534 kg    Physical Exam   Constitutional: He is oriented to person, place, and time. He appears well-developed and well-nourished. He has a sickly appearance. He appears distressed.   HENT:   Head: Normocephalic and atraumatic.   Mouth/Throat: Oropharynx is clear and moist.   Neck: Normal range of motion.   Cardiovascular: Regular rhythm, normal heart sounds and intact distal pulses.  Tachycardia present.    Pulmonary/Chest: Tachypnea noted. He is in respiratory distress. He has decreased breath sounds in the right upper field, the right middle field, the right lower field, the left upper field, the left middle field and the left lower field.   Abdominal: Soft. Bowel sounds are normal.   Musculoskeletal: He exhibits edema.   Neurological: He is alert and oriented to person, place, and time.   Skin: Skin is  warm and dry.   Psychiatric: He has a normal mood and affect. His behavior is normal. Judgment and thought content normal.   Nursing note and vitals reviewed.      MDM and ED Course     ED Medication Orders     Start     Status Ordering Provider    06/09/14 1634    Status:  Discontinued     Comments:  Created by cabinet override    Discontinued     06/09/14 1634  lidocaine (XYLOCAINE) 1 % injection     Comments:  Created by cabinet override    Last MAR action:  Given     06/09/14 1633  fentaNYL (SUBLIMAZE) 0.05 MG/ML injection     Comments:  Created by cabinet override    Last MAR action:  Given     06/09/14 1600     Once in ED,   Status:  Discontinued     Route: Intravenous  Ordered Dose: 2,000 mg     Discontinued Noraa Pickeral HOANG    06/09/14 1536  piperacillin-tazobactam (ZOSYN) 4.5 g in sodium chloride 0.9 % 100 mL IVPB mini-bag plus   Once     Route: Intravenous  Ordered Dose: 4.5 g     Last MAR action:  New Bag Lilit Cinelli, [facility] HOANG    06/09/14 1536  levofloxacin (LEVAQUIN) 750mg  in D5W IVPB (premix)    Once     Route: Intravenous  Ordered Dose: 750 mg     Last MAR action:  New Bag Ceniyah Thorp, [facility] HOANG    06/09/14 1355  sodium chloride 0.9 % bolus 500 mL   Once     Route: Intravenous  Ordered Dose: 500 mL     Last MAR action:  Stopped Darrion Macaulay, [facility] HOANG    06/09/14 1343  cefTRIAXone (ROCEPHIN) 1 g in sodium chloride 0.9 % 100 mL IVPB mini-bag plus   Once     Route: Intravenous  Ordered Dose: 1 g     Last MAR action:  Stopped Iverna Hammac, [facility] HOANG    06/09/14 1343  azithromycin (ZITHROMAX) 500 mg in sodium chloride 0.9 % 250 mL IVPB   Once     Route: Intravenous  Ordered Dose: 500 mg     Last MAR action:  Stopped Dola Lunsford HOANG              MDM  Number of Diagnoses or Management Options  Abscess of lower lobe of left lung with pneumonia:   Pneumonia of both lower lobes due to infectious organism:   Sepsis, due to unspecified organism:   Diagnosis management comments: 3:39 PM spoke with Dr Nedra Hai and Dr Janalyn Rouse, recommend Levaquin and Zosyn, IR drainage possible chest tube.    3:40 PM spoke with Dr Jomarie Longs, IR, who will put in a chest tube    EKG Interpretation  EKG interpreted/review by ED Physician Assistant  Rate: Tachycardia.  Rhythm: Normal sinus rhythm  Axis: Left axis  PR, QRS and QT intervals:  normal for age and rate  ST Segments: No deviations suggestive of ischemia  Impression: Normal ECG with no evidence of ischemia.    The attending signature signifies review and agreement of the history , PE, evaluation, clinical impression and discharge plan except as otherwise noted.    I, Johnnetta Holstine Albertine Grates, have been the primary provider for Brian Trevino during this Emergency Dept visit.    I reviewed nursing recorded vitals and history including PMSFHX  Oxygen saturation by pulse oximetry is 95%-100%, Normal.  Interventions: None Needed.    Plan:  Patient in respiratory distress, sepsis, pneumonia with empymemna who we will admit for chest tube, ID and ICU admission.       Amount and/or Complexity of Data  Reviewed  Clinical lab tests: reviewed and ordered  Tests in the radiology section of CPT: ordered and reviewed  Tests in the medicine section of CPT: ordered and reviewed  Obtain history from someone other than the patient: yes  Review and summarize past medical records: yes  Discuss the patient with other providers: yes    Risk of Complications, Morbidity, and/or Mortality  Presenting problems: high  Diagnostic procedures: high  Management options: high    Critical Care  Total time providing critical care: 30-74 minutes    Patient Progress  Patient progress: stable         Procedures    Results     Procedure Component Value Units Date/Time    URINALYSIS [[id number]]  (Abnormal) Collected:  06/11/14 0944    Specimen Information:  Urine Updated:  06/11/14 1014     Urine Type Catheterized, F      Color, UA YELLOW      Clarity, UA SL CLOUDY (A)      Specific Gravity UA 1.029      Urine pH 6.0      Leukocyte Esterase, UA NEGATIVE      Nitrite, UA NEGATIVE      Protein, UR TRACE (A)      Glucose, UA NEGATIVE      Ketones UA NEGATIVE      Urobilinogen, UA 1.0 mg/dL      Bilirubin, UA NEGATIVE      Blood, UA LARGE (A)     Microscopic, Urine [[id number]]  (Abnormal) Collected:  06/11/14 0944     RBC, UA 11 - 25 (A) /hpf Updated:  06/11/14 1014     WBC, UA 0 - 5 /hpf      Squamous Epithelial Cells, Urine 0 - 5 /hpf      Urine Amorphous Moderate (A) /hpf      Urine Bacteria Rare /hpf     Urine culture [[id number]] Collected:  06/11/14 0944    Specimen Information:  Urine / Urine, Catheterized, Foley Updated:  06/11/14 1004    Culture + Gram Stain,Aerobic, Wound [[id number]] Collected:  06/09/14 1730    Specimen Information:  Aspirate Updated:  06/11/14 0821    Narrative:      ORDER#: [id number]                                    ORDERED BY: LEE, CINDY  SOURCE: Aspirate LEFT LUNG ABSCESS                   COLLECTED:  06/09/14 17:30  ANTIBIOTICS AT COLL.:                                RECEIVED :  06/10/14 02:12  Stain, Gram                                 FINAL       06/10/14 05:11  06/10/14   Many WBCs  Many Gram positive cocci             No Squamous epithelial cells seen  Culture and Gram Stain, Aerobic, Wound     PRELIM      06/11/14 08:21   +  06/11/14   Further report to follow      Vancomycin, trough [[id number]] Collected:  06/11/14 0537    Specimen Information:  Blood Updated:  06/11/14 0614     Vancomycin Trough 14.0 ug/mL      Vancomycin Time of Last Dose UNKNOWN      Vancomycin Date of Last Dose 06/10/2014     Basic Metabolic Panel [[id number]]  (Abnormal) Collected:  06/11/14 0345    Specimen Information:  Blood Updated:  06/11/14 0412     Glucose 97 mg/dL      BUN 78.2 mg/dL      Creatinine 0.9 mg/dL      CALCIUM 7.7 (L) mg/dL      Sodium 956 mEq/L      Potassium 3.7 mEq/L      Chloride 107 mEq/L      CO2 19 (L) mEq/L      Anion Gap 13.0     Magnesium [[id number]] Collected:  06/11/14 0345    Specimen Information:  Blood Updated:  06/11/14 0412     Magnesium 2.0 mg/dL     GFR [[id number]] Collected:  06/11/14 0345     EGFR >60.0 Updated:  06/11/14 0412    CBC [[id number]]  (Abnormal) Collected:  06/11/14 0344    Specimen Information:  Blood / Blood Updated:  06/11/14 0351     WBC 23.71 (H) x10 3/uL      RBC 3.11 (L) x10 6/uL      Hgb 8.9 (L) g/dL      Hematocrit 27.2 (L) %      MCV 83.3 fL      MCH 28.6 pg      MCHC 34.4 g/dL      RDW 16 (H) %      Platelets 476 (H) x10 3/uL      MPV 9.2 (L) fL     MRSA culture [[id number]] Collected:  06/09/14 1843    Specimen Information:  Body Fluid / Nasal Swab Updated:  06/11/14 0048    Narrative:      ORDER#: [id number]                                    ORDERED BY: LEE, CINDY  SOURCE: Nares                                        COLLECTED:  06/09/14 18:43  ANTIBIOTICS AT COLL.:                                RECEIVED :  06/09/14 22:23  Culture MRSA Surveillance                  FINAL       06/11/14 00:48  06/11/14   Negative for Methicillin Resistant Staph aureus      MRSA culture  [[id number]] Collected:  06/09/14 1843    Specimen Information:  Body Fluid / Throat Updated:  06/11/14 0048    Narrative:  ORDER#: [id number]                                    ORDERED BY: LEE, CINDY  SOURCE: Throat                                       COLLECTED:  06/09/14 18:43  ANTIBIOTICS AT COLL.:                                RECEIVED :  06/10/14 00:44  Culture MRSA Surveillance                  FINAL       06/11/14 00:48  06/11/14   Negative for Methicillin Resistant Staph aureus      Chlamydia antibodies [[id number]] Collected:  06/09/14 1843    Specimen Information:  Blood Updated:  06/10/14 2231    Mycoplasma pneumoniae Ab (IgG,IgM),EIA [[id number]] Collected:  06/09/14 1843     Updated:  06/10/14 2231    Blood Culture Aerobic/Anaerobic #2 [[id number]] Collected:  06/09/14 1306    Specimen Information:  Arm / Blood Updated:  06/10/14 1721    Narrative:      ORDER#: [id number]                                    ORDERED BY: Cyndie Chime, Francyne Arreaga  SOURCE: Blood rt ac                                  COLLECTED:  06/09/14 13:06  ANTIBIOTICS AT COLL.:                                RECEIVED :  06/09/14 16:38  Culture Blood Aerobic and Anaerobic        PRELIM      06/10/14 17:21  06/10/14   No Growth after 1 day/s of incubation.      Blood Culture Aerobic/Anaerobic #1 [[id number]] Collected:  06/09/14 1352    Specimen Information:  Arm / Blood Updated:  06/10/14 1721    Narrative:      ORDER#: [id number]                                    ORDERED BY: Carney Harder  SOURCE: Blood Peripheral, Right Wrist                COLLECTED:  06/09/14 13:52  ANTIBIOTICS AT COLL.:                                RECEIVED :  06/09/14 16:38  Culture Blood Aerobic and Anaerobic        PRELIM      06/10/14 17:21  06/10/14   No Growth after 1 day/s of incubation.            Radiology Results (24 Hour)     Procedure Component Value Units Date/Time    CT  Chest WO Contrast [[id number]] Collected:  06/11/14 0919    Order Status:  Completed Updated:   06/11/14 0928    Narrative:      CLINICAL HISTORY: Follow-up of CT guided drainage of a left lower lobe  empyema.    COMPARISON: Chest CT dated 06/09/2014    TECHNIQUE: Helical axial images were reconstructed through the thorax in  5 mm increments . High resolution images were subsequently  reconstructed. No intravenous contrast was utilized.    FINDINGS:    The visualized thyroid gland is normal.    The heart size is normal without evidence of a pericardial effusion.  Extensive coronary artery calcifications are noted.    There is mediastinal adenopathy measuring up to 2 cm which appears  largely unchanged. Subcentimeter hilar adenopathy is also noted and  appears stable.    There remains consolidations of the left upper lobe, lingula, right  middle lobe and right lower lobe which appear mildly worsened since the  prior study. There is a drainage catheter in the left lower lobe with  near complete resolution of the previous empyema. Mild postprocedural  changes are noted of the left hemithorax.    The visualized upper abdomen is unremarkable.    The visualized osseous structures are within normal limits for the  patient's age.      Impression:        1. Drainage catheter in the left lower lobe with near complete  resolution of the previous empyema.  2. Worsening multifocal pneumonia as above.    Annamary Carolin, MD   06/11/2014 9:24 AM      Abscess Drain Check/Change [id number] Collected:  06/10/14 1321    Order Status:  Completed Updated:  06/10/14 1332    Narrative:      HISTORY: Empyema. The anterior superior drainage catheter has backed  partially out of the pleural cavity.    KEY COMPONENT:  Exchange of empyema drainage catheter under fluoroscopic guidance.    ANESTHESIA:  Local with 1% lidocaine.    SEDATION:  Benadryl and fentanyl.    CONTRAST: None.    SPECIMENS: None.    COMPLICATIONS: None.    PROCEDURE:  The nature of the procedure, risks, benefits and  alternatives were discussed with the patient.  All  questions were  answered and consent was obtained.    Using standard sterile technique, a 0.035 inch Amplatz guidewire was  advanced through the drainage catheter into the pleural cavity under  fluoroscopic guidance.  The existing catheter was removed over a 0.035  inch Amplatz guidewire and an attempt was made to advance a 12 French  catheter directly over the guidewire. However, this was not possible.  A  5 French Kumpe catheter was advanced into the pleural cavity over the  guidewire. A new Amplatz guidewire was advanced into the pleural cavity  and an 8 French 25 cm sheath was advanced into the pleural cavity. A  0.035 inch Lunderquist guidewire was then advanced into the pleural  cavity. The sheath was then removed over a 0.035 inch Lunderquist  guidewire. The 12 French locking pigtail drainage catheter was  successfully advanced into the chest. The Cope loop lateral locking  apparatus was engaged. The catheter was secured to the skin with 0 silk  suture material. Vaseline gauze and a sterile dressing was applied.  TPA/dornase was then instilled into this catheter and the posterior  lower chest to and both tubes were clamped. The procedure was well  tolerated.  Impression:         1. The anterior upper drainage catheter had partially pulled out of the  pleural cavity.  2. The existing catheter was exchanged for a larger 12 French locking  pigtail catheter.    COMMENT: TPA/dornase was instilled into both drainage catheters. The  tubes were clamped and will be opened to Pleur-evac with suction and 2  hours.    Joselyn Arrow, MD   06/10/2014 1:27 PM            I have reviewed all labs and/or radiological studies. I have reviewed all xrays if any myself on the PACS system.    Clinical Impression & Disposition     Clinical Impression  Final diagnoses:   Pneumonia of both lower lobes due to infectious organism   Abscess of lower lobe of left lung with pneumonia   Sepsis, due to unspecified organism        ED  Disposition     Admit Bed Type: ICU [10]  Admitting Physician: Lawernce Ion [[id number]]  Patient Class: Inpatient [101]             Current Discharge Medication List                    Carney Harder [place], [place]  06/11/14 1330

## 2014-06-09 NOTE — Progress Notes (Signed)
Pt admitted in stable condition from ERL/IR s/p Left anterior and Left posterior chest tubes placement done in IR for LL infiltrate, fluid and air, probable effusion . Orders received from Dr Nedra Hai.  Pt A&ox3, poor historian, MAE, denies pain on 2 l nc.  Pt denies SOB or chest pains.   Bladder scan shows 375 cc urine, pt voided 25 cc amber urine.  Vancomycin IVPB started via R AC over 2 hrs.  Pt given orientation to ICU setting and plans of care.  Call bell in bed within reach.  Daughter and Son n law called to bedside.   Nurse report given to [facility]. Antionette Fairy Turner-Swartz

## 2014-06-09 NOTE — Consults (Addendum)
Department of Pharmacy Vancomycin Dosing Consult:  Initiation    MRN: [id number]  Room/Bed: IC12/IC12-A    Brian Trevino is being treated with Vancomycin for Pneumonia.  Pharmacy was consulted to dose Vancomycin by Dr. Janalyn Rouse.    Dosing was based on the following parameters:  Pt Age:  80 y.o.  Pt Weight: 92.5 kg  Pt Height:  71 inches   Estimated Creatinine Clearance: 57.1 mL/min (based on Cr of 1.1).  Target trough: 15-17 mcg/mL    The following orders were entered in EPIC:  Vancomycin loading dose of 2000 mg IV.  Vancomycin maintenance dose of 1000 mg IV every 12 hours.  Vancomycin trough prior to the 4th dose.    Pharmacy will monitor drug levels and kidney function and adjust doses as necessary.    Thank you for the consult.  Please contact the pharmacy with any questions at  [phone].    Signed:  Ivy Lynn  Date/Time 06/09/2014 6:42 PM        [facility] Department of Pharmacy Vancomycin Kinetics Dosing Consult:  Continuation    MRN: [id number]  Room/Bed:  IC12/IC12-A    Brian Trevino  is being treated with Vancomycin for pneumonia.  Pharmacy was consulted to dose Vancomycin by Dr.Choudhary.    Based on current trough drawn prior to 4th dose, current Vancomycin regimen is appropriate: Vancomycin  1000 mg IV every 12 hrs.    VANCOMYCIN TROUGH   Date Value Ref Range Status   06/11/2014 14.0 10.0 - 20.0 ug/mL Final        Pharmacy will continue to monitor drug level and kidney function and adjust doses as necessary.    Thank you for the consult.  Please contact the pharmacy with any questions at  [phone].    Signed:  Luiz Ochoa    Date/Time 06/11/2014 6:20 AM

## 2014-06-10 ENCOUNTER — Encounter
Admission: EM | Disposition: A | Discharge status: Skilled Nursing Facility | Payer: Self-pay | Source: Home / Self Care | Attending: Internal Medicine

## 2014-06-10 ENCOUNTER — Inpatient Hospital Stay: Payer: Medicare Other

## 2014-06-10 LAB — ECG 12-LEAD
Atrial Rate: 111 {beats}/min
P Axis: 34 degrees
P-R Interval: 176 ms
Q-T Interval: 336 ms
QRS Duration: 124 ms
QTC Calculation (Bezet): 456 ms
R Axis: -43 degrees
T Axis: 75 degrees
Ventricular Rate: 111 {beats}/min

## 2014-06-10 LAB — BASIC METABOLIC PANEL
Anion Gap: 10 (ref 5.0–15.0)
BUN: 29.2 mg/dL — ABNORMAL HIGH (ref 9.0–28.0)
CO2: 23 mEq/L (ref 22–29)
Calcium: 7.8 mg/dL — ABNORMAL LOW (ref 7.9–10.2)
Chloride: 106 mEq/L (ref 100–111)
Creatinine: 0.9 mg/dL (ref 0.7–1.3)
Glucose: 106 mg/dL — ABNORMAL HIGH (ref 70–100)
Potassium: 4 mEq/L (ref 3.5–5.1)
Sodium: 139 mEq/L (ref 136–145)

## 2014-06-10 LAB — GFR: EGFR: >60

## 2014-06-10 LAB — MAGNESIUM: Magnesium: 1.9 mg/dL (ref 1.6–2.6)

## 2014-06-10 LAB — CBC
Hematocrit: 25.8 % — ABNORMAL LOW (ref 42.0–52.0)
Hgb: 8.8 g/dL — ABNORMAL LOW (ref 13.0–17.0)
MCH: 28.7 pg (ref 28.0–32.0)
MCHC: 34.1 g/dL (ref 32.0–36.0)
MCV: 84 fL (ref 80.0–100.0)
MPV: 9.5 fL (ref 9.4–12.3)
Platelets: 516 10*3/uL — ABNORMAL HIGH (ref 140–400)
RBC: 3.07 10*6/uL — ABNORMAL LOW (ref 4.70–6.00)
RDW: 16 % — ABNORMAL HIGH (ref 12–15)
WBC: 24.42 10*3/uL — ABNORMAL HIGH (ref 3.50–10.80)

## 2014-06-10 SURGERY — ABSCESS DRAIN CHECK/CHANGE
Laterality: Left

## 2014-06-10 MED ORDER — DIPHENHYDRAMINE HCL 50 MG/ML IJ SOLN
25.0000 mg | Freq: Once | INTRAMUSCULAR | Status: AC
Start: 2014-06-10 — End: 2014-06-10

## 2014-06-10 MED ORDER — HEPARIN SODIUM (PORCINE) 5000 UNIT/ML IJ SOLN
5000.0000 [IU] | Freq: Two times a day (BID) | INTRAMUSCULAR | Status: DC
Start: 2014-06-10 — End: 2014-06-17
  Administered 2014-06-10 – 2014-06-17 (×16): 5000 [IU] via SUBCUTANEOUS
  Filled 2014-06-10 (×16): qty 1

## 2014-06-10 MED ORDER — PIPERACILLIN-TAZOBACTAM 4.5 GM IN NS 100 ML IVPB (CNR)
4.5000 g | Freq: Three times a day (TID) | INTRAVENOUS | Status: DC
Start: 2014-06-10 — End: 2014-06-12
  Administered 2014-06-10 – 2014-06-12 (×5): 4.5 g via INTRAVENOUS
  Filled 2014-06-10 (×5): qty 100

## 2014-06-10 MED ORDER — FENTANYL CITRATE 0.05 MG/ML IJ SOLN
50.0000 ug | Freq: Once | INTRAMUSCULAR | Status: AC
Start: 2014-06-10 — End: 2014-06-10
  Administered 2014-06-10: 100 ug via INTRAVENOUS

## 2014-06-10 MED ORDER — DIPHENHYDRAMINE HCL 50 MG/ML IJ SOLN
INTRAMUSCULAR | Status: AC
Start: 2014-06-10 — End: 2014-06-10
  Administered 2014-06-10: 25 mg via INTRAVENOUS
  Filled 2014-06-10: qty 1

## 2014-06-10 MED ORDER — GUAIFENESIN 100 MG/5ML PO SOLN
100.0000 mg | Freq: Once | ORAL | Status: AC
Start: 2014-06-10 — End: 2014-06-10
  Administered 2014-06-10: 100 mg via ORAL
  Filled 2014-06-10: qty 10
  Filled 2014-06-10: qty 5

## 2014-06-10 MED ORDER — DORNASE ALFA 2.5 MG/2.5ML IN SOLN
Freq: Once | RESPIRATORY_TRACT | Status: AC
Start: 2014-06-10 — End: 2014-06-10
  Filled 2014-06-10: qty 10

## 2014-06-10 MED ORDER — FAMOTIDINE 20 MG PO TABS
20.0000 mg | ORAL_TABLET | Freq: Every day | ORAL | Status: DC
Start: 2014-06-10 — End: 2014-06-17
  Administered 2014-06-10 – 2014-06-17 (×7): 20 mg via ORAL
  Filled 2014-06-10 (×9): qty 1

## 2014-06-10 MED ORDER — FENTANYL CITRATE 0.05 MG/ML IJ SOLN
INTRAMUSCULAR | Status: AC
Start: 2014-06-10 — End: 2014-06-10
  Filled 2014-06-10: qty 2

## 2014-06-10 MED ORDER — SODIUM CHLORIDE 0.9 % IV SOLN
INTRAVENOUS | Status: DC
Start: 2014-06-10 — End: 2014-06-15

## 2014-06-10 MED ORDER — ACETYLCYSTEINE 20 % ORAL SOLUTION 3 ML SYRINGE
1200.0000 mg | Freq: Once | RESPIRATORY_TRACT | Status: AC
Start: 2014-06-10 — End: 2014-06-10
  Administered 2014-06-10: 1200 mg via ORAL
  Filled 2014-06-10: qty 6

## 2014-06-10 NOTE — H&P (Signed)
BRIEF VIR H&P    Date Time: 06/10/2014 10:00 AM    PROCEDURALIST COMMENTS BELOW:   Empyema with two chest tubes returning purulent material.  Upper anterior drain is partially dislodged.  For exchange or placement of a new tube.    INDICATIONS:   Procedure(s):  ABSCESS DRAIN CHECK/CHANGE    Preoperative Diagnosis:   Pre-Op Diagnosis Codes:     * Abscess of left lung with pneumonia, unspecified part of lung [J85.1]      PAST MEDICAL HISTORY:     Past Medical History   Diagnosis Date   . Hyperlipemia        PAST SURGICAL HISTORY     Past Surgical History   Procedure Laterality Date   . Cataract extraction           REVIEW OF SYSTEMS REVIEWED:   YES  ( x )      CURRENT MEDICATIONS     Current Facility-Administered Medications   Medication Dose Route Frequency Last Rate Last Dose   . 0.9%  NaCl infusion   Intravenous Continuous 50 mL/hr at 06/10/14 0043     . alteplase 10 mg - dornase 5 mg in sodium chloride 0.9% 50 mL syringe   Chest Tube Once       . famotidine (PEPCID) tablet 20 mg  20 mg Oral Daily       . fentaNYL (SUBLIMAZE) injection 25 mcg  25 mcg Intravenous Q2H PRN       . heparin (porcine) injection 5,000 Units  5,000 Units Subcutaneous Q12H SCH   5,000 Units at 06/10/14 0235   . influenza trivalent vaccine (FLUZONE HIGH-DOSE) IM injection (age greater than 65) 0.5 mL  0.5 mL Intramuscular Prior to discharge       . levofloxacin (LEVAQUIN) 750mg  in D5W IVPB (premix)  750 mg Intravenous Daily       . LORazepam (ATIVAN) injection 2 mg  2 mg Intravenous Q1H PRN       . piperacillin-tazobactam (ZOSYN) 4.5 g in sodium chloride 0.9 % 100 mL IVPB mini-bag plus  4.5 g Intravenous Q8H SCH 200 mL/hr at 06/10/14 0501 4.5 g at 06/10/14 0501   . sodium chloride 0.9 % bolus 1,000 mL  1,000 mL Intravenous PRN       . vancomycin (VANCOCIN) 1,000 mg in sodium chloride 0.9 % 250 mL IVPB  1,000 mg Intravenous Q12H SCH 250 mL/hr at 06/10/14 0549 1,000 mg at 06/10/14 0549       CURRENT MEDICATIONS REVIEWED   YES  (x  )       ALLERGIES:   No Known Allergies    ALLERGIES REVIEWED:   YES (x  )     PREVIOUS REACTION TO SEDATION MEDICATIONS   NO ( x)   YES ( )      PHYSICAL EXAM     AIRWAY CLASSIFICATION:  CLASS I   (  )     CLASS II  (x   )    CLASS III  (  )     CLASS IV  (  )  INTUBATED (  )    CARDIAC:   (  x)  RRR  (  )  IRREG  (  )  MURMUR    LUNGS:   ( x )  CLEAR anteriorly  (  )  DIMINISHED    (  ) LEFT   (  )  RIGHT  (  )  ABSENT          (  )  LEFT   (  )  RIGHT  (  )  TUBES            (  ) LEFT   (  )  RIGHT      ABDOMEN:   Soft NT    NEURO:   AAO x3      LABS:     Lab Results   Component Value Date/Time    WBC 24.42* 06/10/2014 04:40 AM    HCT 25.8* 06/10/2014 04:40 AM    INR 1.3 06/09/2014 01:08 PM    PT 15.9* 06/09/2014 01:08 PM    PTT 34 06/09/2014 01:08 PM    BUN 29.2* 06/10/2014 04:40 AM    CREAT 0.9 06/10/2014 04:40 AM    GLU 106* 06/10/2014 04:40 AM    K 4.0 06/10/2014 04:40 AM       ASA PHYSICAL STATUS   (  )  ASA 1   HEALTHY PATIENT  (  )  ASA 2   MILD SYSTEMIC ILLNESS  ( x )  ASA 3   SYSTEMIC DISEASE, NOT INCAPACITATING  (  )  ASA 4   SEVERE SYSTEMIC DISEASE, IS CONSTANT THREAT TO  LIFE  (  )  ASA 5   MORIBUND CONDITION, NOT EXPECTED TO LIVE >24 HOURS            IRRESPECTIVE OF PROCEDURE  (  )  E           EMERGENCY PROCEDURE     PLANNED SEDATION:   (x  ) NO SEDATION  (  ) MODERATE SEDATION  (  ) DEEP SEDATION WITH ANESTHESIA      CONCLUSION:   PATIENT HAS BEEN REASSESSED IMMEDIATELY PRIOR TO THE PROCEDURE   AND IS AN APPROPRIATE CANDIDATE FOR THE PLANNED SEDATION AND   PROCEDURE.  RISKS, BENEFITS AND ALTERNATIVES TO THE PLANNED   PROCEDURE AND SEDATION HAVE BEEN EXPLAINED TO THE PATIENT   OR GUARDIAN/FAMILY THOROUGHLY. MAJOR RISKS RE INFECTION, BLEEDING, INTERNAL ORGAN INJURY, AND RECURRENCE WERE DISCUSSED.    (x)  YES  (  )  EMERGENCY CONSENT     Signed by Vickki Muff, M.D.    Interventional Radiologist  [facility]- Division of Vascular & Interventional Radiology    Contact Numbers:  Regular business hours (8A-5P  M-F):  [facility]:  [phone]  [facility]:  [phone]    After hours:    Answering service:  [phone]

## 2014-06-10 NOTE — Brief Op Note (Signed)
BRIEF IR PROCEDURE NOTE    Date Time: 06/10/2014 11:30 AM    Patient Name:   Brian Trevino    Date of Operation:   06/10/2014    Providers Performing:   Surgeon(s):  Vickki Muff, MD    Assistant (s): Bolivar Haw, RT, (VI), (CT)    Operative Procedure:   Procedure(s):  CHEST DRAIN CHECK/CHANGE    Preoperative Diagnosis:   Pre-Op Diagnosis Codes:     * Abscess of left lung with pneumonia, unspecified part of lung [J85.1]    Postoperative Diagnosis:   * No post-op diagnosis entered *    Anesthesia:   ( x ) FENTANYL 100 mcg IVP  ( x ) VERSED   ( x ) LOCAL  (  ) GENERAL ANESTHESIA (DEPT OF ANESTHESIOLOGY) )  BENADRYL 25 mg IVP    Estimated Blood Loss:    NONE      CONTRAST   NONE    RADIATION DOSE    1.9 MINUTES FLUORO TIME  12.4 MGy    Findings:   Tube was partially dislodged.  The existing catheter was upsized to 12 [place] and a locking pigtail catheter was placed.    TPA/dornase was instilled (15 mL in anterior, 35 mL in posterior drain)    Complications:   NONE      Signed by: Vickki Muff, MD                                                                              [facility] IVR

## 2014-06-10 NOTE — Progress Notes (Signed)
Infectious Disease            Progress Note    06/10/2014   Osmar Howton ZOX:[id number] is a 80 y.o. male with past medical history significant for hyperlipidemia, who is admitted for bilateral pneumonia, sepsis and lung abscess.     Subjective:     [name] today Symptoms:  Stable.  Afebrile. Feeling better. Had 2 chest tubes placed. No diarrhea, abdominal pain. No dysuria, increase frequency. No headache, dizziness or new weakness tingling or numbness. Other review of system is non contributory.    Objective:     Blood pressure 101/44, pulse 92, temperature 96.3 F (35.7 C), temperature source Temporal Artery, resp. rate 28, height 1.803 m (5\' 11" ), weight 91.6 kg (201 lb 15.1 oz), SpO2 93 %.    General Appearance: Patient looks sick and lethargic.  HEENT: Pallor negative, Anicteric.     Lungs: Decreased breath sounds, especially at the left lung base. Bilateral scattered crackles, chest tubes in place  Heart:  S1 and S2, tachycardic.    Chest Wall: Symmetric chest wall expansion.   Abdomen: Obese, bowel sounds are positive.  Neurological: Alert, awake, moves all extremities.    Laboratory And Diagnostic Studies:     Recent Labs      06/10/14   0440  06/09/14   1308   WBC  24.42*  18.78*   HEMOGLOBIN  8.8*  11.2*   HEMATOCRIT  25.8*  33.1*   PLATELETS  516*  641*   NEUTROPHILS   --   77     Recent Labs      06/10/14   0440  06/09/14   1307   SODIUM  139  140   POTASSIUM  4.0  4.2   CHLORIDE  106  102   CO2  23  22   BUN  29.2*  39.8*   CREATININE  0.9  1.1   GLUCOSE  106*  115*   CALCIUM  7.8*  8.0     Recent Labs      06/09/14   1307   AST (SGOT)  32   ALT  21   ALKALINE PHOSPHATASE  144*   PROTEIN, TOTAL  6.4   ALBUMIN  1.6*   BILIRUBIN, TOTAL  1.9*       Current Med's:     Current Facility-Administered Medications   Medication Dose Route Frequency   . alteplase 10 mg - dornase 5 mg in sodium chloride 0.9% 50 mL syringe   Chest Tube Once   . famotidine  20 mg Oral Daily   . heparin  (porcine)  5,000 Units Subcutaneous Q12H [facility]   . levofloxacin  750 mg Intravenous Daily   . piperacillin-tazobactam  4.5 g Intravenous Q8H [facility]   . vancomycin  1,000 mg Intravenous Q12H [facility]         Assessment:      Condition.  Improving.     Systemic inflammatory response syndrome, sepsis.   Bilateral pneumonia.   Respiratory failure   Lung abscess/empyema: status post chest tube replacements   Hyperlipidemia.    Plan:      Continue Zosyn   Continue Levaquin   Continue Vancomycin   Will follow Cultures   Continue supportive care   Interventional radiology followup   Discussed with patient's daughter   Discussed with Dr. Trina Ao, M.D.,FACP  06/10/2014  7:50 AM

## 2014-06-10 NOTE — Consults (Addendum)
Case Management Initial Discharge Planning Assessment     Psychosocial/Demographic Information   Name of interviewee/s:  Patient    Orientation and decision making abilities of patient (ie a&ox3 able to make decisions, demented patient, patient on vent, etc)   Pnt appears to be a&ox3 and able to make decisions.    Does the patient have an Advance Directive? Location? (home/on chart, if home-advised to bring in copy?) <no information>  Advance Directive: Patient has advance directive, copy not in chart]   Healthcare Decision Maker (HDM) (if other than the patient) Include relationship and contact information.  Self or Son   Any additional emergency contacts? Extended Emergency Contact Information  Primary Emergency Contact: Magda [place]  Mobile Phone: [phone]  Relation: Daughter  Secondary Emergency Contact: Dombkowski Jr,Jamontae "Doristine Church"  [place]  Relation: Son   Pt lives with:  Daughter, Newman Nickels   Type of residence where patient lives:   Single Family one level home w one step into home    Prior level of functioning (ambulation & ADL's)  Pnt reports being independent in all spheres including driving.  6 days ago pnt fell in his shower and since then has been using a cane.    Support system-list  (i.e.church, friends, extended family, friends?) Pnt reports a very strong family support system.  His daughter and him moved to [place] outside of [place] from [place] 5 months ago.  They are here visiting another daughter in [place] and pnt's son is coming from [facility] tomorrow to see pnt.  Plan was to visit for 10 days and return to [facility].  Pnt wants to return to [facility] as soon as released from hospital.     Do you want to designate an individual who will care for or assist you upon discharge? No    If yes: Please list the name, relationship, phone number, and address of the designated individual. Name:  Relationship:  Phone Number:  Address:       Correct Insurance listed on face sheet -  verified with the patient/HDM Yes       Discharge Planning Services in Place  Current LACE score? 4   Name of Primary Care Physician verified in patient banner (update in patient banner if not listed) Dr. Royal [place], [place]   PCP Follow up apptmt offered/set up Pnt perfers to follow up with his PCP in [place] when he is d/c.  CM offered d/c clinic since visiting and he declined at this time.     What DME does the patient currently own? (rolling walker, hospital bed, home O2, BiPAP/CPAP, bedside commode, cane, hoyer lift)   Gilmer Mor    Are PT/OT services indicated? If so, has it been ordered?  May need after bedrest and due to recent falls    Has the patient been to an Acute Rehab or SNF in the past?  If so, where?   No    Does the patient currently have home health or hospice/palliative services in place?  If so, list agency name.   No    Does the patient already have community dialysis set up?  If so, where? No       Readmission Assessment  Is this patient an inpatient to inpatient 30 day readmission? Within 30 Days of Previous Admission?: No   Previous admission discharge diagnosis  Na    Was patient readmitted from a facility? NA   Patient active with Home  Health? NA    Patient active with Home Hospice? NA    Contributing factors to readmission (i.e., no follow up appt on previous d/c, unable to get meds, no insurance, no social support, etc.)    Did patient/family understand what medication was for and how to administer, symptoms to indicate worsening condition, activity and diet restrictions at time of previous d/c? NA     Anticipated Discharge Plan  Anticipated Disposition: Option A  D/C home w family and HH   Anticipated Disposition: Option B D/C home w no needs    Who will transport the patient upon discharge? Family    If applicable, were SNF or Hospice choices provided? NA    Palliative Care Consult needed? (if yes, contact attending MD)   ([facility], [facility], [facility], [facility]) Consider Palliative Care Consult?: NO (06/09/14  1900)  Does Not Meet any above Criteria: None of above criteria (06/09/14 1900)  ([facility] ONLY) Consider Palliative Care Consult?: NO (06/09/14 1900)      IInpatient Medicare/Medicare HMO Patients Only  Was an initial IMM signed within 24 hours of admission?  (Look in Media Tab, Documents Table or Shadow Chart) Patient received 1st IMM Letter?: No

## 2014-06-10 NOTE — Plan of Care (Signed)
Problem: Inadequate Gas Exchange  Goal: Adequately oxygenating and ventilation is improved  Outcome: Progressing  Pt requiring minimal nasal cannula O2.   Intervention: Position patient for maximum ventilatory efficiency  Pt positioned so that CT's are able to flow freely. HOB elevated for full lung expansion.   Intervention: Encourage/Assist patient as needed to turn, cough and perform deep breathing every 2 hours.  Encouraged pt to cough and deep breathe to help expand his lungs and to help them drain.   Intervention: Plan activities to conserve energy: plan rest periods.  Discussed plan to potentially get pt oob today as he tolerates.

## 2014-06-10 NOTE — Progress Notes (Signed)
[facility]- Critical Care Note     ICU Daily Progress Note        Date Time: 06/10/2014 11:28 PM  Patient Name: Brian Trevino  Attending Physician: Lawernce Ion, MD  Room: IC12/IC12-A   Admit Date: 06/09/2014  LOS: 1 day            Assessment:     Patient Active Problem List   Diagnosis   . Respiratory failure    left lung pneumonia with empyema, chest tube placement.  Received today.  TPA infusion.  Chest today to break loculations        Plan:   Chest tube management, antibiotic therapy with Zosyn, Levaquin and vancomycin.  Pending cultures.   CT scan of the chest tomorrow to assess pleural loculations.  Consideration for decortication if pleural loculations persist.      Subjective:   No acute distress    Medications:       Scheduled Meds: PRN Meds:      famotidine 20 mg Oral Daily   heparin (porcine) 5,000 Units Subcutaneous Q12H [facility]   levofloxacin 750 mg Intravenous Daily   piperacillin-tazobactam 4.5 g Intravenous Q8H [facility]   vancomycin 1,000 mg Intravenous Q12H [facility]       Continuous Infusions:  . sodium chloride 50 mL/hr at 06/10/14 0043      fentaNYL 25 mcg Q2H PRN   influenza 0.5 mL Prior to discharge   LORazepam 2 mg Q1H PRN   sodium chloride 1,000 mL PRN             Physical Exam:     Filed Vitals:    06/10/14 1900 06/10/14 2000 06/10/14 2100 06/10/14 2200   BP: 130/54 113/50 98/75    Pulse: 97 97 100    Temp:    97.5 F (36.4 C)   TempSrc:    Temporal Artery   Resp: 37 31 21    Height:       Weight:       SpO2: 94% 93% 93%      Temp (24hrs), Avg:97.4 F (36.3 C), Min:96.3 F (35.7 C), Max:98 F (36.7 C)           10/29 0701 - 10/30 0700  In: 1056 [P.O.:720; I.V.:336]  Out: 889 [Urine:675]       General Appearance: Comfortable  Mental status: Responsive  Neuro:  Nonfocal  H & N: No JVD, no thrush  Lungs: Decreased breath sounds and rhonchi in the left chest, chest tube in place.  Slight purulent drainage and chest tube  Cardiac: Regular  Abdomen:  Benign  Extremities: No edema, no  rash      Data:       Labs:     Recent CBC   Recent Labs  Lab 06/10/14  0440 06/09/14  1308   WBC 24.42* 18.78*   RBC 3.07* 3.92*   HEMOGLOBIN 8.8* 11.2*   HEMATOCRIT 25.8* 33.1*   MCV 84.0 84.4   PLATELETS 516* 641*         Recent Labs  Lab 06/10/14  0440 06/09/14  1308 06/09/14  1307   SODIUM 139  --  140   POTASSIUM 4.0  --  4.2   CHLORIDE 106  --  102   CO2 23  --  22   GLUCOSE 106*  --  115*   BUN 29.2*  --  39.8*   CREATININE 0.9  --  1.1   MAGNESIUM 1.9  --   --  AST (SGOT)  --   --  32   ALT  --   --  21   ALKALINE PHOSPHATASE  --   --  144*   BILIRUBIN, TOTAL  --   --  1.9*   B-NATRIURETIC PEPTIDE  --   --  135.1*   PT  --  15.9*  --    PT INR  --  1.3  --    PTT  --  34  --    TROPONIN I  --  <0.01  --            Rads:     Radiology Results (24 Hour)     Procedure Component Value Units Date/Time    Abscess Drain Check/Change [id number] Collected:  06/10/14 1321    Order Status:  Completed Updated:  06/10/14 1332    Narrative:      HISTORY: Empyema. The anterior superior drainage catheter has backed  partially out of the pleural cavity.    KEY COMPONENT:  Exchange of empyema drainage catheter under fluoroscopic guidance.    ANESTHESIA:  Local with 1% lidocaine.    SEDATION:  Benadryl and fentanyl.    CONTRAST: None.    SPECIMENS: None.    COMPLICATIONS: None.    PROCEDURE:  The nature of the procedure, risks, benefits and  alternatives were discussed with the patient.  All questions were  answered and consent was obtained.    Using standard sterile technique, a 0.035 inch Amplatz guidewire was  advanced through the drainage catheter into the pleural cavity under  fluoroscopic guidance.  The existing catheter was removed over a 0.035  inch Amplatz guidewire and an attempt was made to advance a 12 French  catheter directly over the guidewire. However, this was not possible.  A  5 French Kumpe catheter was advanced into the pleural cavity over the  guidewire. A new Amplatz guidewire was advanced into the  pleural cavity  and an 8 French 25 cm sheath was advanced into the pleural cavity. A  0.035 inch Lunderquist guidewire was then advanced into the pleural  cavity. The sheath was then removed over a 0.035 inch Lunderquist  guidewire. The 12 French locking pigtail drainage catheter was  successfully advanced into the chest. The Cope loop lateral locking  apparatus was engaged. The catheter was secured to the skin with 0 silk  suture material. Vaseline gauze and a sterile dressing was applied.  TPA/dornase was then instilled into this catheter and the posterior  lower chest to and both tubes were clamped. The procedure was well  tolerated.      Impression:         1. The anterior upper drainage catheter had partially pulled out of the  pleural cavity.  2. The existing catheter was exchanged for a larger 12 French locking  pigtail catheter.    COMMENT: TPA/dornase was instilled into both drainage catheters. The  tubes were clamped and will be opened to Pleur-evac with suction and 2  hours.    Joselyn Arrow, MD   06/10/2014 1:27 PM      X-ray chest AP portable [[id number]] Collected:  06/10/14 0845    Order Status:  Completed Updated:  06/10/14 0853    Narrative:      Clinical history: Left empyema drainage.    FINDINGS: AP view of the chest. Compared with 06/09/2014.     Central silhouette is stable. Pulmonary vascularity is normal.     Left-sided chest tube tips  over the upper lateral and lower medial left  hemithorax. Persistent loculated fluid along the left lateral chest.  Patchy airspace disease throughout the left lung with perihilar and  basilar predominance appears stable. Right basilar airspace disease has  mildly increased. New left-sided subcutaneous emphysema.      Impression:         1. Interval placement of 2 left-sided chest tubes. Stable left lung  airspace disease and loculated fluid along the left lateral chest. New  left-sided subcutaneous emphysema.  2. Mildly increased right basilar airspace  disease.    Darra Lis, MD   06/10/2014 8:49 AM            Radiological Imaging personally reviewed.    I have personally reviewed the patient's history and 24 hour interval events, along with vitals, labs, radiology images and additional findings found in detail within ICU team notes, with their care plans developed with and reviewed by me.         Signed by: Durward Fortes, MD  Date/Time: 06/10/2014 11:28 PM

## 2014-06-11 ENCOUNTER — Inpatient Hospital Stay: Payer: Medicare Other

## 2014-06-11 ENCOUNTER — Other Ambulatory Visit: Payer: Medicare Other

## 2014-06-11 LAB — CBC
Hematocrit: 25.9 % — ABNORMAL LOW (ref 42.0–52.0)
Hgb: 8.9 g/dL — ABNORMAL LOW (ref 13.0–17.0)
MCH: 28.6 pg (ref 28.0–32.0)
MCHC: 34.4 g/dL (ref 32.0–36.0)
MCV: 83.3 fL (ref 80.0–100.0)
MPV: 9.2 fL — ABNORMAL LOW (ref 9.4–12.3)
Platelets: 476 10*3/uL — ABNORMAL HIGH (ref 140–400)
RBC: 3.11 10*6/uL — ABNORMAL LOW (ref 4.70–6.00)
RDW: 16 % — ABNORMAL HIGH (ref 12–15)
WBC: 23.71 10*3/uL — ABNORMAL HIGH (ref 3.50–10.80)

## 2014-06-11 LAB — BASIC METABOLIC PANEL
Anion Gap: 13 (ref 5.0–15.0)
BUN: 23.2 mg/dL (ref 9.0–28.0)
CO2: 19 mEq/L — ABNORMAL LOW (ref 22–29)
Calcium: 7.7 mg/dL — ABNORMAL LOW (ref 7.9–10.2)
Chloride: 107 mEq/L (ref 100–111)
Creatinine: 0.9 mg/dL (ref 0.7–1.3)
Glucose: 97 mg/dL (ref 70–100)
Potassium: 3.7 mEq/L (ref 3.5–5.1)
Sodium: 139 mEq/L (ref 136–145)

## 2014-06-11 LAB — URINALYSIS
Bilirubin, UA: NEGATIVE
Glucose, UA: NEGATIVE
Ketones UA: NEGATIVE
Leukocyte Esterase, UA: NEGATIVE
Nitrite, UA: NEGATIVE
Specific Gravity UA: 1.029 (ref 1.001–1.035)
Urine pH: 6 (ref 5.0–8.0)
Urobilinogen, UA: 1 mg/dL (ref 0.2–2.0)

## 2014-06-11 LAB — VANCOMYCIN, TROUGH: Vancomycin Trough: 14 ug/mL (ref 10.0–20.0)

## 2014-06-11 LAB — MAGNESIUM: Magnesium: 2 mg/dL (ref 1.6–2.6)

## 2014-06-11 LAB — URINE MICROSCOPIC

## 2014-06-11 LAB — GFR: EGFR: >60

## 2014-06-11 MED ORDER — DEXMEDETOMIDINE HCL IN NACL 400 MCG/100ML IV SOLN
0.4000 ug/kg/h | INTRAVENOUS | Status: DC
Start: 2014-06-11 — End: 2014-06-12
  Administered 2014-06-11: 0.4 ug/kg/h via INTRAVENOUS
  Administered 2014-06-11: 0.7 ug/kg/h via INTRAVENOUS
  Filled 2014-06-11 (×2): qty 100

## 2014-06-11 MED ORDER — ALBUTEROL-IPRATROPIUM 2.5-0.5 (3) MG/3ML IN SOLN
3.0000 mL | Freq: Four times a day (QID) | RESPIRATORY_TRACT | Status: DC
Start: 2014-06-12 — End: 2014-06-12
  Administered 2014-06-12 (×3): 3 mL via RESPIRATORY_TRACT
  Filled 2014-06-11 (×3): qty 3

## 2014-06-11 MED ORDER — ACETYLCYSTEINE 20 % IN SOLN
2.0000 mL | Freq: Two times a day (BID) | RESPIRATORY_TRACT | Status: DC
Start: 2014-06-11 — End: 2014-06-12
  Administered 2014-06-12: 2 mL via RESPIRATORY_TRACT
  Filled 2014-06-11 (×3): qty 4

## 2014-06-11 MED ORDER — QUETIAPINE FUMARATE 25 MG PO TABS
25.0000 mg | ORAL_TABLET | Freq: Every evening | ORAL | Status: DC
Start: 2014-06-11 — End: 2014-06-15
  Administered 2014-06-11 – 2014-06-15 (×5): 25 mg via ORAL
  Filled 2014-06-11 (×6): qty 1

## 2014-06-11 NOTE — Progress Notes (Signed)
Phone update give to daughter Jeani Sow

## 2014-06-11 NOTE — Progress Notes (Signed)
Infectious Disease            Progress Note    06/11/2014   Brian Trevino [id number] is a 80 y.o. male with past medical history significant for hyperlipidemia, who is admitted for bilateral pneumonia, sepsis and lung abscess.     Subjective:     [name] today Symptoms:  Stable.  More weak and lethargic. Afebrile. Had 2 chest tubes placed. No diarrhea, abdominal pain. Other review of system is non contributory.    Objective:     Blood pressure 113/58, pulse 92, temperature 98.3 F (36.8 C), temperature source Temporal Artery, resp. rate 34, height 1.803 m (5\' 11" ), weight 91.6 kg (201 lb 15.1 oz), SpO2 95 %.    General Appearance: Patient looks sick and lethargic.  HEENT: Pallor negative, Anicteric.     Lungs: Decreased breath sounds, especially at the left lung base. Bilateral scattered crackles, chest tubes in place  Heart:  S1 and S2, tachycardic.    Chest Wall: Symmetric chest wall expansion.   Abdomen: Obese, bowel sounds are positive.  Neurological: Alert, awake, moves all extremities.    Laboratory And Diagnostic Studies:     Recent Labs      06/11/14   0344  06/10/14   0440  06/09/14   1308   WBC  23.71*  24.42*  18.78*   HEMOGLOBIN  8.9*  8.8*  11.2*   HEMATOCRIT  25.9*  25.8*  33.1*   PLATELETS  476*  516*  641*   NEUTROPHILS   --    --   77     Recent Labs      06/11/14   0345  06/10/14   0440   SODIUM  139  139   POTASSIUM  3.7  4.0   CHLORIDE  107  106   CO2  19*  23   BUN  23.2  29.2*   CREATININE  0.9  0.9   GLUCOSE  97  106*   CALCIUM  7.7*  7.8*     Recent Labs      06/09/14   1307   AST (SGOT)  32   ALT  21   ALKALINE PHOSPHATASE  144*   PROTEIN, TOTAL  6.4   ALBUMIN  1.6*   BILIRUBIN, TOTAL  1.9*       Current Med's:     Current Facility-Administered Medications   Medication Dose Route Frequency   . famotidine  20 mg Oral Daily   . heparin (porcine)  5,000 Units Subcutaneous Q12H [facility]   . levofloxacin  750 mg Intravenous Daily   . piperacillin-tazobactam  4.5 g  Intravenous Q8H [facility]   . QUEtiapine  25 mg Oral QHS   . vancomycin  1,000 mg Intravenous Q12H [facility]         Assessment:      Condition.  Improving.     Systemic inflammatory response syndrome, sepsis.   Bilateral pneumonia.   Respiratory failure   Lung abscess/empyema: status post chest tube placements   Hyperlipidemia.    Plan:      Continue Zosyn   Continue Levaquin   Continue Vancomycin   Will follow Cultures   Continue supportive care   Interventional radiology followup   Discussed with patient's daughter and son        Alfonzo Beers, M.D.,FACP  06/11/2014  9:44 AM

## 2014-06-11 NOTE — Progress Notes (Signed)
Pt transported via bed in stable condition to CT scan for chest CT, pt tolerated procedure on Precedex gtt and returned to ICU in stable condition. Antionette Fairy Turner-Swartz

## 2014-06-11 NOTE — Progress Notes (Signed)
Patient with visual hallucinations.  Able to answer history questions (where did you work), continues to try to climb from bed.  Precedex started.

## 2014-06-11 NOTE — Progress Notes (Addendum)
Pt is becoming increasingly more confused.  He reorients quickly.  "Yes, I know it is night time.  I want to call my daughter.  She has 3 dogs, she doesn't sleep."  He was found trying to call 911 with his call bell.  Reoriented approximately every 30 minutes.  Oxygen saturation >92%  Pt denies pain or needing anything.    Safety checks increased to q60minutes, and writer is now documenting in doorway (02:12).  Pt is trying to eat his IV tubing and oxygen tubing.  Consistently denies confusion.

## 2014-06-11 NOTE — Progress Notes (Signed)
PROGRESS NOTE    Date Time: 06/11/2014 1:49 PM  Patient Name: Brian Trevino, Brian Trevino      Assessment:   POD 2 placement of 2 L chest tubes for empyema.  CT from 06/11/14 showed near complete resolution of L empyema.  Pulmonary infiltrates, however, are increased.    Plan:   Continue CT drainage for now and follow outputs.  When output less than 20cc/d for 2 days, consider removal.    Subjective:   Pt agitated/confused last PM, requiring ativan.  More alert now.    Physical Exam:     Filed Vitals:    06/11/14 1200   BP: 104/52   Pulse: 68   Temp:    Resp: 28   SpO2: 96%   NWG:NFAO. RRR. Tachy.  LUNGS:decreased BS, scattered rhonchi.  ZHY:QMVH, NT, BS present  Drain:drains put out 110cc and 170cc over 24h.     Labs:     Recent Labs  Lab 06/11/14  0344 06/10/14  0440 06/09/14  1308   WBC 23.71* 24.42* 18.78*   RBC 3.11* 3.07* 3.92*   HEMOGLOBIN 8.9* 8.8* 11.2*   HEMATOCRIT 25.9* 25.8* 33.1*       Recent Labs  Lab 06/11/14  0345 06/10/14  0440 06/09/14  1307   GLUCOSE 97 106* 115*   BUN 23.2 29.2* 39.8*   CREATININE 0.9 0.9 1.1   SODIUM 139 139 140   CO2 19* 23 22   ALBUMIN  --   --  1.6*   AST (SGOT)  --   --  32   ALT  --   --  21       Recent Labs  Lab 06/09/14  1308   PT INR 1.3   PTT 34         Rads:     Radiology Results (24 Hour)     Procedure Component Value Units Date/Time    CT Chest WO Contrast [[id number]] Collected:  06/11/14 0919    Order Status:  Completed Updated:  06/11/14 8413    Narrative:      CLINICAL HISTORY: Follow-up of CT guided drainage of a left lower lobe  empyema.    COMPARISON: Chest CT dated 06/09/2014    TECHNIQUE: Helical axial images were reconstructed through the thorax in  5 mm increments . High resolution images were subsequently  reconstructed. No intravenous contrast was utilized.    FINDINGS:    The visualized thyroid gland is normal.    The heart size is normal without evidence of a pericardial effusion.  Extensive coronary artery calcifications are noted.    There is mediastinal  adenopathy measuring up to 2 cm which appears  largely unchanged. Subcentimeter hilar adenopathy is also noted and  appears stable.    There remains consolidations of the left upper lobe, lingula, right  middle lobe and right lower lobe which appear mildly worsened since the  prior study. There is a drainage catheter in the left lower lobe with  near complete resolution of the previous empyema. Mild postprocedural  changes are noted of the left hemithorax.    The visualized upper abdomen is unremarkable.    The visualized osseous structures are within normal limits for the  patient's age.      Impression:        1. Drainage catheter in the left lower lobe with near complete  resolution of the previous empyema.  2. Worsening multifocal pneumonia as above.    Annamary Carolin, MD  06/11/2014 9:24 AM            Signed by: Eugenio Hoes.,

## 2014-06-11 NOTE — Progress Notes (Signed)
10/30 Pt with dramatic increase in confusion after 11pm.  At first he was simply disoriented to place and time, with small effort to reorient.  Escalated to increase in HR, RR and he began making attempts to climb from bed.  Ativan administered.  Remained restless and confused, no sedative quality from administration of Ativan.  MD called to bedside to evaluate.  Hallucinations present, but patient able to answer questions at that time.  Precedex started.  Pt continued to attempt to climb from bed.  RN remained at bedside for 60 minutes to keep patient safe.  Pt asleep at 0345

## 2014-06-11 NOTE — Progress Notes (Signed)
[facility]- Critical Care Note     ICU Daily Progress Note        Date Time: 06/11/2014 10:25 PM  Patient Name: Brian Trevino  Attending Physician: Lawernce Ion, MD  Room: IC12/IC12-A   Admit Date: 06/09/2014  LOS: 2 days            Assessment:     Patient Active Problem List   Diagnosis   . Respiratory failure    pneumonia with empyema in the left status post chest tube placement.  TPA placed through chest tube to lyse adhesions and loculations.  Pleural output greater than 100 mL today.  Good response.  Patient afebrile  CT of the chest shows bilateral  lower lobe and right middle lobes consolidations.  Pleural fluid cultures with gram-positive cocci, pending ID  Patient had sundowning last night, required Precedex for adequate sedation and maintenance of respiratory status  Appears improved clinically today area.  Persistent leukocytosis    Plan:   Continue antibiotic therapy per infectious diseases, pulmonary toilet, chest tube drainage        Subjective:   Somnolent, responsive   Medications:       Scheduled Meds: PRN Meds:      famotidine 20 mg Oral Daily   heparin (porcine) 5,000 Units Subcutaneous Q12H [facility]   levofloxacin 750 mg Intravenous Daily   piperacillin-tazobactam 4.5 g Intravenous Q8H SCH   QUEtiapine 25 mg Oral QHS   vancomycin 1,000 mg Intravenous Q12H SCH       Continuous Infusions:  . sodium chloride 50 mL/hr at 06/11/14 0800   . dexmedetomidine Stopped (06/11/14 1200)      fentaNYL 25 mcg Q2H PRN   influenza 0.5 mL Prior to discharge   LORazepam 2 mg Q1H PRN   sodium chloride 1,000 mL PRN             Physical Exam:     Filed Vitals:    06/11/14 1700 06/11/14 1800 06/11/14 1900 06/11/14 2000   BP: 110/48 111/51 113/45 113/47   Pulse: 79 85 87 87   Temp:  97.3 F (36.3 C)     TempSrc:  Temporal Artery     Resp: 22 34 34 18   Height:       Weight:       SpO2: 95% 94% 95% 95%     Temp (24hrs), Avg:97.6 F (36.4 C), Min:96.9 F (36.1 C), Max:98.3 F (36.8 C)           10/30 0701 -  10/31 0700  In: 544.67 [I.V.:544.67]  Out: 830 [Urine:550]       General Appearance: Somnolent   Mental status: Arousal unresponsive, confused   Neuro:  Moves all extremities intermittently agitated   H & N: No JVD, no thrush   Lungs: Rhonchi in the bases, left chest tube in place.  Rapid shallow respirations   Cardiac: Regular   Abdomen:  Soft, depressible, obese, nontender   Extremities: Trace edema   Skin: No rash      Data:       Vent Settings:    Vent Settings  FiO2: 100 %      Labs:     Recent CBC   Recent Labs  Lab 06/11/14  0344 06/10/14  0440 06/09/14  1308   WBC 23.71* 24.42* 18.78*   RBC 3.11* 3.07* 3.92*   HEMOGLOBIN 8.9* 8.8* 11.2*   HEMATOCRIT 25.9* 25.8* 33.1*   MCV 83.3 84.0 84.4  PLATELETS 476* 516* 641*         Recent Labs  Lab 06/11/14  0345 06/10/14  0440 06/09/14  1308 06/09/14  1307   SODIUM 139 139  --  140   POTASSIUM 3.7 4.0  --  4.2   CHLORIDE 107 106  --  102   CO2 19* 23  --  22   GLUCOSE 97 106*  --  115*   BUN 23.2 29.2*  --  39.8*   CREATININE 0.9 0.9  --  1.1   MAGNESIUM 2.0 1.9  --   --    AST (SGOT)  --   --   --  32   ALT  --   --   --  21   ALKALINE PHOSPHATASE  --   --   --  144*   BILIRUBIN, TOTAL  --   --   --  1.9*   B-NATRIURETIC PEPTIDE  --   --   --  135.1*   PT  --   --  15.9*  --    PT INR  --   --  1.3  --    PTT  --   --  34  --    TROPONIN I  --   --  <0.01  --            Rads:     Radiology Results (24 Hour)     Procedure Component Value Units Date/Time    CT Chest WO Contrast [[id number]] Collected:  06/11/14 0919    Order Status:  Completed Updated:  06/11/14 0928    Narrative:      CLINICAL HISTORY: Follow-up of CT guided drainage of a left lower lobe  empyema.    COMPARISON: Chest CT dated 06/09/2014    TECHNIQUE: Helical axial images were reconstructed through the thorax in  5 mm increments . High resolution images were subsequently  reconstructed. No intravenous contrast was utilized.    FINDINGS:    The visualized thyroid gland is normal.    The heart size is  normal without evidence of a pericardial effusion.  Extensive coronary artery calcifications are noted.    There is mediastinal adenopathy measuring up to 2 cm which appears  largely unchanged. Subcentimeter hilar adenopathy is also noted and  appears stable.    There remains consolidations of the left upper lobe, lingula, right  middle lobe and right lower lobe which appear mildly worsened since the  prior study. There is a drainage catheter in the left lower lobe with  near complete resolution of the previous empyema. Mild postprocedural  changes are noted of the left hemithorax.    The visualized upper abdomen is unremarkable.    The visualized osseous structures are within normal limits for the  patient's age.      Impression:        1. Drainage catheter in the left lower lobe with near complete  resolution of the previous empyema.  2. Worsening multifocal pneumonia as above.    Annamary Carolin, MD   06/11/2014 9:24 AM            Radiological Imaging personally reviewed.    I have personally reviewed the patient's history and 24 hour interval events, along with vitals, labs, radiology images and additional findings found in detail within ICU team notes, with their care plans developed with and reviewed by me.         Signed by: Joella Prince Desira Alessandrini,  MD  Date/Time: 06/11/2014 10:25 PM

## 2014-06-11 NOTE — Plan of Care (Signed)
Problem: Inadequate Gas Exchange  Goal: Adequately oxygenating and ventilation is improved  Intervention: Position patient for maximum ventilatory efficiency  Pt was very confused this morning and attempting to get out of the bed, pulling off monitor and NC.  Precedex gtt titrated to .75 mcg/kg/hr and effective. Pt tolerated CT scan procedure and returned to ICU in stable condition.  Pt incont.of urine, foley catheter placed without difficulty- urine returned red with sediment, Dr Lesle Reek aware. UA & culture sent to the lab.  Precedex gtt weaned off per Dr Lesle Reek at 1145.  Pt more alert and appreciate this afternoon, tolerating clear liquid diet.  Son and daughter at bedside today and spoke with Dr Lesle Reek and Dr Janalyn Rouse regarding bilat PNA and delirium.      Comments:   Antionette Fair[facility]

## 2014-06-12 ENCOUNTER — Inpatient Hospital Stay: Payer: Medicare Other

## 2014-06-12 LAB — BASIC METABOLIC PANEL
Anion Gap: 12 (ref 5.0–15.0)
BUN: 24.4 mg/dL (ref 9.0–28.0)
CO2: 19 mEq/L — ABNORMAL LOW (ref 22–29)
Calcium: 7.5 mg/dL — ABNORMAL LOW (ref 7.9–10.2)
Chloride: 107 mEq/L (ref 100–111)
Creatinine: 0.8 mg/dL (ref 0.7–1.3)
Glucose: 86 mg/dL (ref 70–100)
Potassium: 3.6 mEq/L (ref 3.5–5.1)
Sodium: 138 mEq/L (ref 136–145)

## 2014-06-12 LAB — CBC
Hematocrit: 24.6 % — ABNORMAL LOW (ref 42.0–52.0)
Hgb: 8.4 g/dL — ABNORMAL LOW (ref 13.0–17.0)
MCH: 28.4 pg (ref 28.0–32.0)
MCHC: 34.1 g/dL (ref 32.0–36.0)
MCV: 83.1 fL (ref 80.0–100.0)
MPV: 9.1 fL — ABNORMAL LOW (ref 9.4–12.3)
Platelets: 464 10*3/uL — ABNORMAL HIGH (ref 140–400)
RBC: 2.96 10*6/uL — ABNORMAL LOW (ref 4.70–6.00)
RDW: 16 % — ABNORMAL HIGH (ref 12–15)
WBC: 20.4 10*3/uL — ABNORMAL HIGH (ref 3.50–10.80)

## 2014-06-12 LAB — MAGNESIUM: Magnesium: 1.8 mg/dL (ref 1.6–2.6)

## 2014-06-12 LAB — GFR: EGFR: >60

## 2014-06-12 MED ORDER — ALBUTEROL-IPRATROPIUM 2.5-0.5 (3) MG/3ML IN SOLN
3.0000 mL | RESPIRATORY_TRACT | Status: DC
Start: 2014-06-12 — End: 2014-06-14
  Administered 2014-06-12 – 2014-06-14 (×12): 3 mL via RESPIRATORY_TRACT
  Filled 2014-06-12 (×12): qty 3

## 2014-06-12 MED ORDER — FOLIC ACID 1 MG PO TABS
1.0000 mg | ORAL_TABLET | Freq: Every day | ORAL | Status: DC
Start: 2014-06-12 — End: 2014-06-17
  Administered 2014-06-12 – 2014-06-17 (×6): 1 mg via ORAL
  Filled 2014-06-12 (×6): qty 1

## 2014-06-12 MED ORDER — CHLORDIAZEPOXIDE HCL 5 MG PO CAPS
5.0000 mg | ORAL_CAPSULE | Freq: Two times a day (BID) | ORAL | Status: DC
Start: 2014-06-12 — End: 2014-06-14
  Administered 2014-06-12 – 2014-06-13 (×4): 5 mg via ORAL
  Filled 2014-06-12 (×4): qty 1

## 2014-06-12 MED ORDER — NICOTINE 14 MG/24HR TD PT24
1.0000 | MEDICATED_PATCH | Freq: Every day | TRANSDERMAL | Status: DC
Start: 2014-06-12 — End: 2014-06-14
  Administered 2014-06-12 – 2014-06-13 (×2): 1 via TRANSDERMAL
  Filled 2014-06-12 (×3): qty 1

## 2014-06-12 MED ORDER — CLINDAMYCIN PHOSPHATE IN D5W 900 MG/50ML IV SOLN
900.0000 mg | Freq: Three times a day (TID) | INTRAVENOUS | Status: DC
Start: 2014-06-12 — End: 2014-06-17
  Administered 2014-06-12 – 2014-06-16 (×14): 900 mg via INTRAVENOUS
  Filled 2014-06-12 (×15): qty 50

## 2014-06-12 MED ORDER — ACETYLCYSTEINE 20 % IN SOLN
2.0000 mL | RESPIRATORY_TRACT | Status: DC
Start: 2014-06-12 — End: 2014-06-14
  Administered 2014-06-12 – 2014-06-14 (×11): 2 mL via RESPIRATORY_TRACT
  Filled 2014-06-12 (×16): qty 4

## 2014-06-12 MED ORDER — SODIUM CHLORIDE 0.9 % IV MBP
2.0000 g | INTRAVENOUS | Status: DC
Start: 2014-06-12 — End: 2014-06-17
  Administered 2014-06-12 – 2014-06-17 (×6): 2 g via INTRAVENOUS
  Filled 2014-06-12 (×6): qty 2000

## 2014-06-12 MED ORDER — THIAMINE HCL 100 MG PO TABS
100.0000 mg | ORAL_TABLET | Freq: Every day | ORAL | Status: DC
Start: 2014-06-12 — End: 2014-06-17
  Administered 2014-06-12 – 2014-06-17 (×6): 100 mg via ORAL
  Filled 2014-06-12 (×6): qty 1

## 2014-06-12 NOTE — Progress Notes (Signed)
[facility]- Critical Care Note     ICU Daily Progress Note        Date Time: 06/12/2014 2:09 PM  Patient Name: Brian Trevino  Attending Physician: Lawernce Ion, MD  Room: IC12/IC12-A   Admit Date: 06/09/2014  LOS: 3 days        Assessment/Plan:     Neuro:  Incidental finding of possible sella mass? In head ct. Will need to follow up as outpt .  Pt is delirious based on needing precedex yesterday.  He is confused and very tremulous and tachycardic.   Will start librium 5mg  po bid to help with delirium.  Give vitamins. Family states pt does not drink alcohol daily.     Resp: respiratory failure from Community-acquired pneumonia complicated by empyema- Strep. Intermedius pneumonia.  Appreciate IR chest tube placement. Actively draining purulent material. Lung reexpanded.  Suspect aspiration.     Nutrition: Allow clear liquid diet    Infectious Disease (ID): Community-acquired pneumonia complicated by empyema- strep intermedius pneumonia.  Appreciate Dr. Myrtis Ser evaluation recommendation. Will change abx to clindamycin and ceftriaxone.     Hem/Onc: Anemia. No signs of active bleed    Renal /Fluid, Electrolytes : Acute kidney injury- improved.     Prophylaxis:   GI Prophylaxis: +  VTE Prophylaxis:+      Code Status: full code    Discussed with patient's daughter from [place] at the bedside and answered her questions.       Subjective:   80 y.o. male history of hyperlipidemia visiting from [place] who presents to the hospital with respiratory failure. Patient arrived via plane 2 days prior to admission.      Confused, tremulousness.     Hospital Course  10/29-  Larey Seat coming out of shower w/ bloody cough for several days--Sepsis , Abscess Lt lung w/  loculated Pleural effusion  (wbc=18.78, lactic=2.6, Flu=neg) ,   IR s/p x2 CT--#1~ Lt upper anterolateral pigtail  and   #2 ~ Lt posterior Lower=   10/30 replaced Lt upper anterior then tpa placed in line  10/31 Off Precedex gtt  11/1 Up in chair, confused,  tremulous      Medications:   Scheduled Meds:  Current Facility-Administered Medications   Medication Dose Route Frequency   . acetylcysteine  2 mL Nebulization BID   . albuterol-ipratropium  3 mL Nebulization Q6H SCH   . cefTRIAXone  2 g Intravenous Q24H SCH   . clindamycin  900 mg Intravenous Q8H SCH   . famotidine  20 mg Oral Daily   . heparin (porcine)  5,000 Units Subcutaneous Q12H [facility]   . QUEtiapine  25 mg Oral QHS         Continuous Infusions:  . sodium chloride 50 mL/hr at 06/12/14 0800   . dexmedetomidine Stopped (06/11/14 1200)          Physical Exam:     Filed Vitals:    06/12/14 1300   BP: 126/52   Pulse: 101   Temp:    Resp: 39   SpO2: 96%         Intake/Output Summary (Last 24 hours) at 06/12/14 1409  Last data filed at 06/12/14 1000   Gross per 24 hour   Intake   2795 ml   Output   1045 ml   Net   1750 ml     General Appearance: differed in appearance compared to admission, more delirious and tremulous, tachycardic.   Mental status:  Oriented only to  person and state of [place].   Neuro: very tremulous in his hands, generalized weakness  Neck:supple, no significant adenopathy  Lungs: improved rhonchi left lower lung field  Cardiac: ns1, ns2, 2/6 SEM at apex, no rub/gallop  Abdomen:  Obese, nontender, nondistended  Extremities: no lower extremity edema  Skin: no rash, 2 left sided chest tube site intact and dressing clean      Labs:     Labs (last 72 hours):  Recent Labs      06/12/14   0335  06/11/14   0344  06/10/14   0440   WBC  20.40*  23.71*  24.42*   HEMOGLOBIN  8.4*  8.9*  8.8*   HEMATOCRIT  24.6*  25.9*  25.8*     No results for input(s): PT, INR, PTT in the last 72 hours. Recent Labs      06/12/14   0335  06/11/14   0345  06/10/14   0440   SODIUM  138  139  139   POTASSIUM  3.6  3.7  4.0   CHLORIDE  107  107  106   CO2  19*  19*  23   BUN  24.4  23.2  29.2*   CREATININE  0.8  0.9  0.9   GLUCOSE  86  97  106*   CALCIUM  7.5*  7.7*  7.8*   MAGNESIUM  1.8  2.0  1.9                     Rads:          Radiological Imaging personally reviewed,and agree with radiology report including:       CXR   06/12/2014    Multifocal pneumonia, 2 left sided pigtail in place    I have personally reviewed the patient's history and 24 hour interval events, along with vitals, labs, radiology images and nursing.         Signed by: Lawernce Ion, MD  Date/Time: 06/12/2014 2:09 PM

## 2014-06-12 NOTE — Progress Notes (Signed)
Infectious Disease            Progress Note    06/12/2014   Brian Trevino NIO:[id number] is a 80 y.o. male with past medical history significant for hyperlipidemia, who is admitted for bilateral pneumonia, sepsis and lung abscess.     Subjective:     Brian Trevino today Symptoms:  Stable.  More comfortable today. Cultures growing strep intermedius. Afebrile. Had 2 chest tubes placed. No diarrhea, abdominal pain. Other review of system is non contributory.    Objective:     Blood pressure 130/54, pulse 108, temperature 97.8 F (36.6 C), temperature source Temporal Artery, resp. rate 30, height 1.803 m (5\' 11" ), weight 91.6 kg (201 lb 15.1 oz), SpO2 93 %.    General Appearance: Patient looks more comfortable today.  HEENT: Pallor negative, Anicteric.     Lungs: Decreased breath sounds. Bilateral scattered crackles, chest tubes in place  Heart:  S1 and S2, tachycardic.    Chest Wall: Symmetric chest wall expansion.   Abdomen: Obese, bowel sounds are positive.  Neurological: Alert, awake, moves all extremities, have some tremers.    Laboratory And Diagnostic Studies:     Recent Labs      06/12/14   0335  06/11/14   0344   06/09/14   1308   WBC  20.40*  23.71*   < >  18.78*   HEMOGLOBIN  8.4*  8.9*   < >  11.2*   HEMATOCRIT  24.6*  25.9*   < >  33.1*   PLATELETS  464*  476*   < >  641*   NEUTROPHILS   --    --    --   77    < > = values in this interval not displayed.     Recent Labs      06/12/14   0335  06/11/14   0345   SODIUM  138  139   POTASSIUM  3.6  3.7   CHLORIDE  107  107   CO2  19*  19*   BUN  24.4  23.2   CREATININE  0.8  0.9   GLUCOSE  86  97   CALCIUM  7.5*  7.7*     Recent Labs      06/09/14   1307   AST (SGOT)  32   ALT  21   ALKALINE PHOSPHATASE  144*   PROTEIN, TOTAL  6.4   ALBUMIN  1.6*   BILIRUBIN, TOTAL  1.9*       Current Med's:     Current Facility-Administered Medications   Medication Dose Route Frequency   . acetylcysteine  2 mL Nebulization BID   . albuterol-ipratropium  3  mL Nebulization Q6H SCH   . cefTRIAXone  2 g Intravenous Q24H [facility]   . clindamycin  900 mg Intravenous Q8H [facility]   . famotidine  20 mg Oral Daily   . heparin (porcine)  5,000 Units Subcutaneous Q12H [facility]   . QUEtiapine  25 mg Oral QHS         Assessment:      Condition.  Improving.     Systemic inflammatory response syndrome, sepsis.   Strep intermedius infection   Bilateral pneumonia.   Respiratory failure   Lung abscess/empyema: status post chest tube placements   Hyperlipidemia.    Plan:      Discontinue  Zosyn   Discontinue Levaquin   Discontinue  Vancomycin   Start Rocephin   Start clindamycin  Will follow Cultures   Continue supportive care   Interventional radiology followup   Discussed with patient's daughter and son   Discussed with Dr. Roxan Hockey, M.D.,FACP  06/12/2014  9:56 AM

## 2014-06-12 NOTE — PT Eval Note (Signed)
[facility]  [address]  [place], [place]. [address]    Department of Rehabilitation  [phone]    Physical Therapy Evaluation    Patient: Brian Trevino    MRN#: [id number]     IC12/IC12-A    Time of treatment: Time Calculation  PT Received On: 06/12/14  Start Time: 1210  Stop Time: 1240  Time Calculation (min): 30 min    PT Visit Number: 1    Consult received for Brian Trevino for PT Evaluation and Treatment.  Patient's medical condition is appropriate for Physical therapy intervention at this time.      Assessment:   JAVARIS WIGINGTON is a 80 y.o. male admitted 06/09/2014 presenting with pneumonia, lung abscess, currently w/ chest tubes. Pt is normally independent in all spheres, but currently presents with significant decr I and decr mobility and presents with the following impairments: Decreased LE strength;Decreased endurance/activity tolerance;Decreased safety/judgement during functional mobility;Decreased functional mobility;Decreased balance;Gait impairment.     Therapy Diagnosis: generalized weakness, decreased functional mobility , decreased independence with ADL's, increased gait dysfunction, decreased endurance/ activity engagement and decreased safety awareness due to pneumonia & resp failure. Without therapy interventions, patient is at risk for falls, dependence on caregivers for mobility, dependence on caregivers for ADL's, decreased independence and failure to return to PLOF.    Rehabilitation Potential: Prognosis: Good;With continued PT status post acute discharge      Plan:    Treatment/Interventions: Exercise;Gait training;Neuromuscular re-education;LE strengthening/ROM;Functional transfer training;Patient/family training;Endurance training;Equipment eval/education;Bed mobility PT Frequency: 3-4x/wk    Risks/Benefits/POC Discussed with Pt/Family: With patient/family          Goals:   Goals  Goal Formulation: With patient;With family  Time for Goal Acheivement: 7 visits  Goals:  Select goal  Pt Will Go Supine To Sit: with minimal assist  Pt Will Perform Sit to Stand: with minimal assist  Pt Will Transfer Bed/Chair: with moderate assist  Pt Will Ambulate: 11-30 feet;with rolling walker;with minimal assist  Pt Will Perform Home Exer Program: independent;to maximize functional mobility and independence      Discharge Recommendations:   Based on today's session patient's discharge recommendation is the following: Discharge Recommendation: SNF     If Discharge Recommendation: SNF is not available, then the patient will need home health services, increase supervision , assistance with mobility and assistance with ADL's.  DME Recommended for Discharge: Front wheel walker      Precautions  Other Precautions: falls, chest tube    Medical Diagnosis: Sepsis, due to unspecified organism [A41.9]  Pneumonia of both lower lobes due to infectious organism [J16.8]  Abscess of lower lobe of left lung with pneumonia [J85.1]  Abscess of left lung without pneumonia, unspecified part of lung [J85.2]    History of Present Illness: Brian Trevino is a 80 y.o. male admitted on 06/09/2014 with history of hyperlipidemia visiting from [place] who presents to the hospital with respiratory failure.  Patient arrived via plane 2 days prior to admission.  He had worsening shortness of breath approximately one month prior with increasing productive sputum.  He states that he went to his primary care physician who prescribed codeine.  He had not been exposed to antibiotics these past few months.  Patient noted fever and chills.  Family noted patient was confused with increasing sputum production.  When patient's told family.  He was unable to brief.  They called 911.  She was noted to be in respiratory distress and hypoxic.  Chest CT scan indicated  large lung abscess.  Patient underwent chest tube placement by interventional radiology with good success.  Approximately of pus came from chest tube.  (H&P obtained from  physician note in chart).        Patient Active Problem List   Diagnosis   . Respiratory failure        Past Medical/Surgical History:  Past Medical History   Diagnosis Date   . Hyperlipemia       Past Surgical History   Procedure Laterality Date   . Cataract extraction           Social History:  Prior Level of Function  Prior level of function: Independent with ADLs, Ambulates independently (has used a cane in past 2 wks s/p fall x2)  Baseline Activity Level: Community ambulation, Household ambulation  Driving: independent  Cooking: light meal prep  Employment: Retired  DME Currently at [employer]: Single point cane  Home Living Arrangements  Living Arrangements: Children (in [place]. Visiting other dtr in area here)  Type of Home: House  Home Layout: One level (no STE)  Bathroom Shower/Tub: Event organiser: Built-in shower seat, Grab bars around toilet  DME Currently at Home: Single point cane  Home Living - Notes / Comments: Pt had 1 fall in shower and another fall within past 2 weeks  Daughter states after his first fall, pt began having tremor in R hand.     Subjective:    Patient is agreeable to participation in the therapy session. Nursing clears patient for therapy, but asks to limit activity today as pt fatigues quickly, needed max A x2 for OOB .  Patient Goal  Patient Goal: no falls, go home  Pain Assessment  Pain Assessment: Numeric Scale (0-10)  Pain Score: 4-moderate pain  POSS Score: Awake and Alert  Pain Location: Knee  Pain Orientation: Left  Pain Frequency: Increases with movement    Objective:   Observation of Patient/Vital Signs:  Patient is seated in a bedside chair with telemetry, peripheral IV, O2  via nasal cannula and indwelling urinary catheter, chest tubex2  in place. Noted L UE twitching/tremor intermittently.     Inspection/Posture  Inspection/Posture: mild pedal edema    Cognition  Arousal/Alertness: Appropriate responses to stimuli  Attention Span: Appears intact  Orientation  Level: Oriented X4 (initially off on the year, but then did math to calculate )  Memory: Appears intact  Following Commands: Follows all commands and directions without difficulty  Safety Awareness: minimal verbal instruction  Insights: Decreased awareness of deficits;Educated in safety awareness  Problem Solving: Assistance required to identify errors made  Neuro Status  Behavior: attentive;calm;cooperative  Motor Planning: intact  Coordination: intact  Cognitive Deficit(s): insight  Hand Dominance: right handed    Hearing: impaired and wears hearing aide  Vision: WNL  Sensation: WNL    Gross ROM  Right Upper Extremity ROM: within functional limits  Left Upper Extremity ROM: within functional limits  Right Lower Extremity ROM: within functional limits  Left Lower Extremity ROM: within functional limits  Gross Strength  Right Upper Extremity Strength: 5/5  Left Upper Extremity Strength: 5/5  Right Lower Extremity Strength: 5/5 (hip at least 3/5 by obs)  Left Lower Extremity Strength: 5/5 (hip at least 3/5 by obs)  Tone  Tone: within functional limits    Functional Mobility  Supine to Sit:  (deferred as pt already OOB)  Transfers  Bed to Chair: Maximal Assist (x2 per Jasmine December, RN. Did not test standing again  w/ pt )        Participation and Endurance  Participation Effort: good  Endurance: Tolerates < 10 min exercise, no significant change in vital signs (+DOE)     Treatment Activities: Pt's son and daughter present for session. Pt's RN, Jasmine December, requests to limit activity today. Pt up in chair, Pt educated on and performed: AP,LAQ, seated marches, AP,hip ABD/ADD seated, shldr Flex to 90 degress,DB x10 each, given h/o to continue throughout the day on his own.Pt needing to take frequent breaks between the exercises due to DOE with mild activity; educ in pacing and DB. Deferred standing secondary to pt fatigued with minimal therex in chair. Educated pt and family re: probable need for rehab at [place] before going home due to  profound weakness and recent falls at home.     Educated the patient to role of physical therapy, plan of care, goals of therapy and HEP, safety with mobility and ADLs, pursed lip breathing.    Carmel Garfield, MSPT

## 2014-06-12 NOTE — Plan of Care (Signed)
Problem: Inadequate Gas Exchange  Goal: Adequately oxygenating and ventilation is improved  Intervention: Encourage/Assist patient as needed to turn, cough and perform deep breathing every 2 hours.  Pt remains on 4 L NC, crackles throughout.  Pt states he feels he is breathing better today, rr remains in 30's-40's Spo2 >93%, wet congested non-productive cough.  Left anterior and left posterior chests remains to -20 cm of suction, 20 cc yellow drainage from posterior chest tube today, no output from anterior CT- Dr Nedra Hai and Dr Janalyn Rouse aware.  Pt noted to cough with thin liquids this morning, swallow eval ordered, per Dr Nedra Hai pt ok to have mech soft with honey thick liquids at this time.  Pt oob to recliner chair with max 2 person asssit with weak gait. Pt worked with PT today. Dr Nedra Hai spoke with pt, son, and daughter Darel Hong regarding plans of care for delirium, hand tremors noted, and bilat PNA and they all verbalized understanding of all plans of care.       Comments:   [facility]

## 2014-06-12 NOTE — Plan of Care (Signed)
Problem: Safety  Goal: Patient will be free from injury during hospitalization  Outcome: Progressing  Intervention: Provide and maintain safe environment  Bed locked and in lowest position. Pt has non skid socks on. Bed/chair alarm on. Bedside table within reach. Call bell within reach  Intervention: Include patient/family/caregiver in decisions related to safety  Family at bedside and verbalized understanding of  safety precautions  Intervention: Hourly rounding.  Pt rounded on hourly and prn

## 2014-06-12 NOTE — Progress Notes (Signed)
PROGRESS NOTE    Date Time: 06/12/2014 2:14 PM  Patient Name: Brian Trevino, Brian Trevino      Assessment:   POD 3 placement of 2 L chest tubes for empyema.  CT from 06/11/14 showed near complete resolution of L empyema. CXR from 11/1 unchanged    Plan:   Continue CT drainage for now and follow outputs.  When output less than 20cc/d for 2 days, consider removal.    Subjective:   Feeling much better.  Conversant.    Physical Exam:     Filed Vitals:    06/12/14 1400   BP: 116/67   Pulse: 109   Temp:    Resp: 22   SpO2: 94%   COR: RRR   LUNGS: Scattered rhonchi.  ADB: Soft, BS nl. NT.  Drain: 2 L CT's remain in place.  Draining 60 and 20cc/24hrs  Labs: WBC's down 24 to 20K  CXR: No change since prior day.  Bilateral pneumonia.  Cx: Strep intermedius, sensitive to vanc      Labs:     Recent Labs  Lab 06/12/14  0335 06/11/14  0344 06/10/14  0440   WBC 20.40* 23.71* 24.42*   RBC 2.96* 3.11* 3.07*   HEMOGLOBIN 8.4* 8.9* 8.8*   HEMATOCRIT 24.6* 25.9* 25.8*       Recent Labs  Lab 06/12/14  0335 06/11/14  0345 06/10/14  0440 06/09/14  1307   GLUCOSE 86 97 106* 115*   BUN 24.4 23.2 29.2* 39.8*   CREATININE 0.8 0.9 0.9 1.1   SODIUM 138 139 139 140   CO2 19* 19* 23 22   ALBUMIN  --   --   --  1.6*   AST (SGOT)  --   --   --  32   ALT  --   --   --  21       Recent Labs  Lab 06/09/14  1308   PT INR 1.3   PTT 34       Rads:     Radiology Results (24 Hour)     Procedure Component Value Units Date/Time    X-ray chest AP portable [[id number]] Collected:  06/12/14 0810    Order Status:  Completed Updated:  06/12/14 0815    Narrative:      CLINICAL HISTORY: Multifocal pneumonia    COMPARISON: Chest CT dated 06/11/2014    AP view of the chest was performed.    FINDINGS:   There are two left-sided drainage pigtail catheters in place.  There remain consolidations of the right lower lobe and left lower lobe  consistent with the patient's multifocal pneumonia which is largely  unchanged.  The heart size and bones are within normal limits.       Impression:        Multifocal pneumonia, unchanged.    Annamary Carolin, MD   06/12/2014 8:11 AM      CT Chest WO Contrast [[id number]] Collected:  06/11/14 0919    Order Status:  Completed Updated:  06/12/14 0814    Addenda:        Addendum: Additional finding    There is an additional drainage catheter in the left upper lobe for  drainage of an empyema in the major fissure. There is near complete  resolution of the previous empyema in the left major fissure. The  remainder of the report is unchanged.    Annamary Carolin, MD   06/12/2014 8:10 AM    Signed:  06/12/14 5573  by Ria Comment, MD    Narrative:      CLINICAL HISTORY: Follow-up of CT guided drainage of a left lower lobe  empyema.    COMPARISON: Chest CT dated 06/09/2014    TECHNIQUE: Helical axial images were reconstructed through the thorax in  5 mm increments . High resolution images were subsequently  reconstructed. No intravenous contrast was utilized.    FINDINGS:    The visualized thyroid gland is normal.    The heart size is normal without evidence of a pericardial effusion.  Extensive coronary artery calcifications are noted.    There is mediastinal adenopathy measuring up to 2 cm which appears  largely unchanged. Subcentimeter hilar adenopathy is also noted and  appears stable.    There remains consolidations of the left upper lobe, lingula, right  middle lobe and right lower lobe which appear mildly worsened since the  prior study. There is a drainage catheter in the left lower lobe with  near complete resolution of the previous empyema. Mild postprocedural  changes are noted of the left hemithorax.    The visualized upper abdomen is unremarkable.    The visualized osseous structures are within normal limits for the  patient's age.      Impression:        1. Drainage catheter in the left lower lobe with near complete  resolution of the previous empyema.  2. Worsening multifocal pneumonia as above.    Annamary Carolin, MD   06/11/2014 9:24 AM             Signed by: Eugenio Hoes.,

## 2014-06-13 ENCOUNTER — Inpatient Hospital Stay: Payer: Medicare Other

## 2014-06-13 LAB — CBC AND DIFFERENTIAL
Hematocrit: 23.5 % — ABNORMAL LOW (ref 42.0–52.0)
Hgb: 7.9 g/dL — ABNORMAL LOW (ref 13.0–17.0)
MCH: 27.8 pg — ABNORMAL LOW (ref 28.0–32.0)
MCHC: 33.6 g/dL (ref 32.0–36.0)
MCV: 82.7 fL (ref 80.0–100.0)
MPV: 9 fL — ABNORMAL LOW (ref 9.4–12.3)
Platelets: 450 10*3/uL — ABNORMAL HIGH (ref 140–400)
RBC: 2.84 10*6/uL — ABNORMAL LOW (ref 4.70–6.00)
RDW: 16 % — ABNORMAL HIGH (ref 12–15)
WBC: 13.55 10*3/uL — ABNORMAL HIGH (ref 3.50–10.80)

## 2014-06-13 LAB — MAN DIFF ONLY
Band Neutrophils Absolute: 0.27 10*3/uL (ref 0.00–1.00)
Band Neutrophils: 2 %
Basophils Absolute Manual: 0 10*3/uL (ref 0.00–0.20)
Basophils Manual: 0 %
Eosinophils Absolute Manual: 0.14 10*3/uL (ref 0.00–0.70)
Eosinophils Manual: 1 %
Lymphocytes Absolute Manual: 0.95 10*3/uL (ref 0.50–4.40)
Lymphocytes Manual: 7 %
Monocytes Absolute: 0.81 10*3/uL (ref 0.00–1.20)
Monocytes Manual: 6 %
Neutrophils Absolute Manual: 11.38 10*3/uL — ABNORMAL HIGH (ref 1.80–8.10)
Nucleated RBC: 0 /100 WBC (ref 0–1)
Segmented Neutrophils: 84 %

## 2014-06-13 LAB — COMPREHENSIVE METABOLIC PANEL
ALT: 20 U/L (ref 0–55)
AST (SGOT): 50 U/L — ABNORMAL HIGH (ref 5–34)
Albumin/Globulin Ratio: 0.3 — ABNORMAL LOW (ref 0.9–2.2)
Albumin: 1 g/dL — ABNORMAL LOW (ref 3.5–5.0)
Alkaline Phosphatase: 89 U/L (ref 38–106)
Anion Gap: 12 (ref 5.0–15.0)
BUN: 20.3 mg/dL (ref 9.0–28.0)
Bilirubin, Total: 0.8 mg/dL (ref 0.2–1.2)
CO2: 21 mEq/L — ABNORMAL LOW (ref 22–29)
Calcium: 7.5 mg/dL — ABNORMAL LOW (ref 7.9–10.2)
Chloride: 108 mEq/L (ref 100–111)
Creatinine: 0.8 mg/dL (ref 0.7–1.3)
Globulin: 3.7 g/dL — ABNORMAL HIGH (ref 2.0–3.6)
Glucose: 119 mg/dL — ABNORMAL HIGH (ref 70–100)
Potassium: 3.2 mEq/L — ABNORMAL LOW (ref 3.5–5.1)
Protein, Total: 4.7 g/dL — ABNORMAL LOW (ref 6.0–8.3)
Sodium: 141 mEq/L (ref 136–145)

## 2014-06-13 LAB — CELL MORPHOLOGY
Cell Morphology: ABNORMAL — AB
Platelet Estimate: INCREASED — AB

## 2014-06-13 LAB — PHOSPHORUS: Phosphorus: 3.5 mg/dL (ref 2.3–4.7)

## 2014-06-13 LAB — MAGNESIUM: Magnesium: 2 mg/dL (ref 1.6–2.6)

## 2014-06-13 LAB — GFR: EGFR: >60

## 2014-06-13 MED ORDER — POTASSIUM CHLORIDE 10 MEQ/100ML IV SOLN
10.0000 meq | INTRAVENOUS | Status: AC
Start: 2014-06-13 — End: 2014-06-13
  Administered 2014-06-13 (×3): 10 meq via INTRAVENOUS
  Filled 2014-06-13 (×3): qty 100

## 2014-06-13 MED ORDER — POTASSIUM CHLORIDE CRYS ER 20 MEQ PO TBCR
20.0000 meq | EXTENDED_RELEASE_TABLET | Freq: Once | ORAL | Status: AC
Start: 2014-06-13 — End: 2014-06-13
  Administered 2014-06-13: 20 meq via ORAL
  Filled 2014-06-13: qty 1

## 2014-06-13 NOTE — Progress Notes (Signed)
The following was explained to the patient and her daughter, Jeani Sow in the ICU room, during our conversation on SNF:    Medicare and Skilled Nursing Facilities   How often is it covered?   Part A covers inpatient hospital stays, care in a skilled nursing facility, hospice care, and some home health care.   Medicare Part A [facility]) covers Care like intravenous injections that can only be given by a registered nurse or doctor.   skilled nursing care in a skilled nursing facility (SNF) under certain conditions for a limited time.   Medicare-covered services include, but aren't limited to:   Semi-private room (a room you share with other patients)   Meals   Skilled nursing care   Physical and occupational therapy*   Speech-language pathology services*   Medical social services   Medications   Medical supplies and equipment used in the facility   Ambulance transportation (when other transportation endangers health) to the nearest supplier of needed services that aren't available at the SNF   Dietary counseling   *Medicare covers these services if they're needed to meet your health goal.   If you're in a SNF but must be readmitted to the hospital, there's no guarantee that a bed will be available for you at the same SNF if you need more skilled care after your hospital stay. Ask the SNF if it will hold a bed for you if you must go back to the hospital. Also, ask if there's a cost to hold the bed for you.   Who's eligible?   People with Medicare are covered if they meet all of these conditions:    You have Part A and have days left in your benefit period.   You have a qualifying hospital stay- The way that Original Medicare measures your use of hospital and skilled nursing facility (SNF) services. A benefit period begins the day you are admitted as an inpatient in a hospital or SNF. The benefit period ends when you haven't received any inpatient hospital care (or skilled care in a SNF) for 60 days in a  row. If you go into a hospital or a SNF after one benefit period has ended, a new benefit period begins. You must pay the inpatient hospital deductible for each benefit period. There is no limit to the number of benefit periods.   Your doctor has decided that you need daily skilled care given by, or under the direct supervision of, skilled nursing or rehabilitation staff. If you're in the SNF for skilled rehab services only, your care is considered daily care even if these therapy services are offered just 5 or 6 days a week, as long as you need and get the therapy services each day they're offered.   You get these skilled services in a SNF that's certified by Medicare.   You need these skilled services for a medical condition that was either:   A hospital related medical condition   A condition that started while you were getting care in the skilled nursing facility for a hospital-related medical condition  Your doctor may order observation services to help decide whether you need to be admitted to the hospital as an inpatient or can be discharged. During the time you're getting observation services in the hospital, you're considered an outpatient--you can't count this time towards the 3-day inpatient hospital stay needed for Medicare to cover your SNF stay.   Note: If you refuse your daily skilled care or therapy,  you may lose your Medicare SNF coverage. If your condition won't allow you to get skilled care (for instance if you get the flu), you may be able to continue to get Medicare coverage temporarily.   Your costs in Original Medicare   You pay:    Days 1-20: $0 for each benefit period.   Days 21-100:$157 coinsurance per day of each benefit period.   Days 101 and beyond all costs.   If your break in skilled care lasts more than 30 days, you need a new 3-day hospital stay to qualify for additional SNF care. The new hospital stay doesn't need to be for the same condition that you were treated for  during your previous stay.   If your break in skilled care lasts for at least 60 days in a row, this ends your current benefit period and renews your SNF benefits. This means that the maximum coverage available would be up to 100 days of SNF benefits.  The following information was taken from the Medicare.gov website.   Your case manager will send refferals out to [facility]. You and your family should provide your 1st, 2nd and 3rd choice of locations.   When your doctor has said that you are medical stable for discharge, you must take the first bed available. You cannot stay in the hosptail and wait for your first choice to open.   If you have any questions about MEDICARE please call: [phone]

## 2014-06-13 NOTE — Progress Notes (Signed)
Infectious Disease            Progress Note    06/13/2014   Brian Trevino [id number] is a 80 y.o. male with past medical history significant for hyperlipidemia, who is admitted for bilateral pneumonia, sepsis and empyema.     Subjective:     [name] today Symptoms:  Stable.  Feeling better. Cultures growing strep intermedius. Afebrile. Had 2 chest tubes placed. No diarrhea, abdominal pain. Other review of system is non contributory.    Objective:     Blood pressure 126/59, pulse 97, temperature 99.2 F (37.3 C), temperature source Temporal Artery, resp. rate 24, height 1.803 m (5\' 11" ), weight 91.6 kg (201 lb 15.1 oz), SpO2 96 %.    General Appearance: Comfortable; chest tube is in place  HEENT: Pallor negative, Anicteric.     Lungs: Decreased breath sounds. Bilateral scattered crackles, chest tubes in place  Heart:  S1 and S2, tachycardic.    Chest Wall: Symmetric chest wall expansion.   Abdomen: Obese, bowel sounds are positive.  Neurological: Alert, awake, moves all extremities    Laboratory And Diagnostic Studies:     Recent Labs      06/13/14   0338  06/12/14   0335   WBC  13.55*  20.40*   HEMOGLOBIN  7.9*  8.4*   HEMATOCRIT  23.5*  24.6*   PLATELETS  450*  464*   NEUTROPHILS  84   --      Recent Labs      06/13/14   0338  06/12/14   0335   SODIUM  141  138   POTASSIUM  3.2*  3.6   CHLORIDE  108  107   CO2  21*  19*   BUN  20.3  24.4   CREATININE  0.8  0.8   GLUCOSE  119*  86   CALCIUM  7.5*  7.5*     Recent Labs      06/13/14   0338   AST (SGOT)  50*   ALT  20   ALKALINE PHOSPHATASE  89   PROTEIN, TOTAL  4.7*   ALBUMIN  1.0*   BILIRUBIN, TOTAL  0.8       Current Med's:     Current Facility-Administered Medications   Medication Dose Route Frequency   . acetylcysteine  2 mL Nebulization Q4H [facility]   . albuterol-ipratropium  3 mL Nebulization Q4H SCH   . cefTRIAXone  2 g Intravenous Q24H [facility]   . chlordiazePOXIDE  5 mg Oral BID   . clindamycin  900 mg Intravenous Q8H [facility]   . famotidine   20 mg Oral Daily   . folic acid  1 mg Oral Daily   . heparin (porcine)  5,000 Units Subcutaneous Q12H [facility]   . nicotine  1 patch Transdermal Daily   . QUEtiapine  25 mg Oral QHS   . thiamine  100 mg Oral Daily         Assessment:      Condition.  Improving.     Systemic inflammatory response syndrome, sepsis.   Strep intermedius infection   Bilateral pneumonia.   Respiratory failure   Empyema: status post chest tube placements   Hyperlipidemia.    Plan:      Continue  rocephin   Continue  clindamycin   Continue supportive care   Interventional radiology followup   Discussed with patient's daughter    Discussed with Dr. Lysle Morales  Alfonzo Beers, M.D.,FACP  06/13/2014  8:11 AM

## 2014-06-13 NOTE — Progress Notes (Signed)
As per pt's daughter, Noreene Larsson, patient will need to go to the Skilled Nursing Facility locally as it will be hard for patient to travel back to [place] at this time.    Facility choices given by pt's daughter are as below:     [facility]st [facility], 2nd [facility], 3rd [address], [address]       [facility] referral created as discussed with pt's daughter and CM will follow up as needed.

## 2014-06-13 NOTE — PT Progress Note (Signed)
Physical Therapy Cancellation Note    Patient: Brian Trevino  [id number]    Unit: IC12/IC12-A    Patient not seen for physical therapy secondary to pt and family having meeting w/ md. P:Will follow as appropriate.    Trude Mcburney  PM&R  [phone]

## 2014-06-13 NOTE — SLP Eval Note (Signed)
[facility]  [address]  [place], [place]. [address]    Department of Rehabilitation Services  [phone]    Speech and Language Therapy Bedside Swallow Evaluation    Patient: Brian Trevino    MRN#: [id number]     Time of Treatment: SLP Received On: 06/13/14  Start Time: 1005  Stop Time: 1030  Time Calculation (min): 25 min         Consult received for Brian Trevino for SLP Bedside Swallow Evaluation and Treatment.      Assessment:   1. Pt with at least mild oropharyngeal phase dysphagia characterized by delayed AP transit of harder solids, as well as a slightly delayed swallow, resulting in occasional throat clear/cough with consecutive sips of thin liquids.  2. Pt with ? Mild R sided facial droop which pt and daughter report is baseline- they noted that pt has drooled for an extended period of time. Daughter and pt attributed this to an extended history of pipe smoking. (?)    Plan/ Recommendations:      SLP Frequency Recommended: 3x per week  Follow up treatments: diet tolerance monitoring, strategies training  Duration of Treatment:  (<2 weeks)  Diet Solids Recommendation: regular  Diet Liquids Recommendations: thin consistency  Precautions/Compensations: Awake/alert, Upright 90 degrees for all oral intake, 45 degrees upright after meals, Alternate solids and liquids  Recommendation Discussed With: : Patient, Caregiver, Physician, Nurse, Posted bedside  Recommended Form of Meds: whole, with liquid, with puree (as pt wishes)      Goals:  1. Pt will demonstrate adequate oropharyngeal phase skills for baseline PO diet without s/s of aspiration X2  2. Pt will demonstrate independent use of compensatory strategies to facilitate improved swallow function, present aspiration over two sessions.        Discharge Recommendations:   Recommendations:  (? appropriate for S/AC rehab when appropriate for d/c from [facility])   patient's discharge recommendation is the following: Discharge Recommendation: SNF (PER  PT)      Medical Diagnosis: Sepsis, due to unspecified organism [A41.9]  Pneumonia of both lower lobes due to infectious organism [J16.8]  Abscess of lower lobe of left lung with pneumonia [J85.1]  Abscess of left lung without pneumonia, unspecified part of lung [J85.2]    History of Present Illness: Brian Trevino is a 80 y.o. male admitted on 06/09/2014 with Brian Trevino is a 80 y.o. male  history of hyperlipidemia visiting from [place] who presented to the hospital with respiratory failure.  Patient arrived via plane 2 days prior to admission.  He had worsening shortness of breath approximately one month prior with increasing productive sputum.  He states that he went to his primary care physician who prescribed codeine.  He had not been exposed to antibiotics these past few months.  Patient noted fever and chills.  Family noted patient was confused with increasing sputum production.  When patient's told family.  He was unable to brief.  They called 911.  She was noted to be in respiratory distress and hypoxic.  Chest CT scan indicated large lung abscess.  Patient underwent chest tube placement by interventional radiology with good success.  Approximately of pus came from chest tube.    Patient state that he felt better after chest tube placement.  Improvement in shortness of breath.  He has mild right diaphragmatic pain from cough.    CXR-IMPRESSION:     Portable AP radiographic examination of the chest shows no  significant change  in bibasilar opacities and left-sided chest tubes.  These opacities are likely due to a combination of factors including  basilar lung consolidation and pleural effusions. At this point, I feel  that the majority of the opacity is due to lung consolidation.   Overall  lung volumes are shallow.     There is no significant change from the  prior exam.      Patient Active Problem List   Diagnosis   . Respiratory failure        Past Medical/Surgical History:  Past Medical History    Diagnosis Date   . Hyperlipemia       Past Surgical History   Procedure Laterality Date   . Cataract extraction           History/Current Status:  History/Current Status  Respiratory Status: O2 via nasal cannula  Behavior/Mental Status: Awake/alert, Able to follow directions, Cooperative, Pleasant mood  Nutrition:  (mech soft with thickened liquids per RN)      Subjective:   Patient is agreeable to participation in the therapy session. Patient's medical condition is appropriate for Speech therapy intervention at this time.  "I won't drink that thickened liquid stuff for much longer".    Objective:   Observation of Patient/Vital Signs:  Patient is in bed with peripheral IV, O2  via nasal cannula in place.    Oral Motor Skills  Oral Motor Skills: exceptions to [facility]  Oral Motor Impairments: right paresis, ROM, coordination, strength    Deglutition Skills  Position: upright 90 degrees  Food(s) Tested: ice chips, thin liquid, nectar thick liquid, puree, solid  Oral Stage: bolus formation/control reduced, AP propulsion reduced, chewing reduced, slow but effective  Pharyngeal Stage: delayed and slightly  uncoordinated swallow response for both liquids and solids; pt with adequate laryngeal elevation; clear breath sounds and vocal quality pre and post swallow, to cervical auscultation; no cough or choke with solids or thickened liquids, however, occasional cough noted after consecutive sips of thin liquids; vitals remained unchanged  Esophageal Stage:  (not assessed)    Diagnostic treatment was initiated with patient and his family. They were instructed on both general aspiration precautions and specific strategies to use to minimize the risk of aspiration. Strategies included the following which patient and family were able to demonstrate:              1. Alternate liquids and solids             2. Check oral cavity for retention             3. Pace meal             4. Single sips of thin liquids only      Doralee Albino. Margo Aye,  MS CCC-SLP

## 2014-06-13 NOTE — Plan of Care (Signed)
Pt's K 3.2 and H & H is 7.9/23.5. Pt has no obvious bleeding. Scant serosanguinous drainage from one chest tube. eICU called and notified. IV K replacement ordered. RN will continue to monitor pt closely and implement plan of care

## 2014-06-13 NOTE — Progress Notes (Signed)
INTERVENTIONAL RADIOLOGY PROGRESS NOTE    Date Time: 06/13/2014 9:40 AM  Patient Name: Brian Trevino,Brian Trevino      Assessment:   Pneumonia.  Empyema.  Drain outputs decreasing. WBCT trending down. Culture positive for strep intermedius.    Plan:   Continue chest tube drainage.  If anterior drain output remains low overnight, may remove it on Tuesday.  Continue antibiotics.    Subjective:   Still with cough.  Decreased shortness of breath.    Physical Exam:   BP: 126/59, Pulse: 96, RR: 24 SpO2: 96%, Temp: 99.2 F (37.3 C) (Temporal Artery)   Chest clear.  Chest tube dressings clean, dry.    I & O     Intake and Output Summary (Last 24 hours) at Date Time    Intake/Output Summary (Last 24 hours) at 06/13/14 0940  Last data filed at 06/13/14 0600   Gross per 24 hour   Intake   1976 ml   Output   1396 ml   Net    580 ml     Drain Output:     Anterior chest drain output 6 mL/24 hours.  Posterior chest drain output 40 mL/24 hours.    Labs:     Recent Labs  Lab 06/13/14  0338 06/12/14  0335 06/11/14  0344   WBC 13.55* 20.40* 23.71*   HEMOGLOBIN 7.9* 8.4* 8.9*   HEMATOCRIT 23.5* 24.6* 25.9*   PLATELETS 450* 464* 476*     Culture: Growing strep intermedius from pleural fluid    Rads:     Radiology Results (24 Hour)     Procedure Component Value Units Date/Time    X-ray chest AP portable [[id number]] Collected:  06/13/14 0522    Order Status:  Completed Updated:  06/13/14 0537    Narrative:      INDICATION: 80 year old male with history of empyema.  Status post  pleural drainage on 06/09/2014.                  COMPARISON: Chest radiograph from yesterday. CT chest 06/11/2014       TECHNIQUE: XR CHEST AP PORTABLE: AP portable semierect    FINDINGS:  Smallbore thoracostomy pigtail catheters appear unchanged  with tips overlying the medial left lung base and left upper lobe  laterally. Bibasilar opacities appear grossly unchanged.  Overall lung  volumes are shallow.       The upper lobes show no focal infiltrate or  mass. An azygos  fissure is seen in the right upper lobe which is a  normal anatomic variant.      No obvious pulmonary edema is seen, but  mild pulmonary edema is difficult to exclude.      No pneumothorax is  seen. There is no obvious large pleural effusion. However, small pleural  effusions are not excluded.       The heart is not enlarged.      There  is no mediastinal shift and the upper mediastinum does not appear  widened.      The left hilar structures are not well seen.  The right  hilar structures appear unremarkable.       There is no apparent  subdiaphragmatic free air.      No significant bony abnormalities  detected.  Spinal degenerative changes are noted. Multiple cardiac  monitoring wires overlie the chest.         Impression:       Portable AP radiographic examination of the chest shows no  significant  change in bibasilar opacities and left-sided chest tubes.  These opacities are likely due to a combination of factors including  basilar lung consolidation and pleural effusions. At this point, I feel  that the majority of the opacity is due to lung consolidation.   Overall  lung volumes are shallow.     There is no significant change from the  prior exam.    Miguel Dibble, MD   06/13/2014 5:32 AM             Signed by: Vickki Muff, MD    Interventional Radiologist  [facility]- Division of Vascular & Interventional Radiology    Contact Numbers:     Regular business hours (8A-5P M-F):  [facility]:  [phone]  [facility]:  [phone]    After hours:  Answering service:  [phone]

## 2014-06-14 ENCOUNTER — Inpatient Hospital Stay: Payer: Medicare Other

## 2014-06-14 ENCOUNTER — Encounter
Admission: EM | Disposition: A | Discharge status: Skilled Nursing Facility | Payer: Self-pay | Source: Home / Self Care | Attending: Internal Medicine

## 2014-06-14 LAB — CBC AND DIFFERENTIAL
Hematocrit: 23.6 % — ABNORMAL LOW (ref 42.0–52.0)
Hgb: 8 g/dL — ABNORMAL LOW (ref 13.0–17.0)
MCH: 28 pg (ref 28.0–32.0)
MCHC: 33.9 g/dL (ref 32.0–36.0)
MCV: 82.5 fL (ref 80.0–100.0)
MPV: 8.9 fL — ABNORMAL LOW (ref 9.4–12.3)
Platelets: 428 10*3/uL — ABNORMAL HIGH (ref 140–400)
RBC: 2.86 10*6/uL — ABNORMAL LOW (ref 4.70–6.00)
RDW: 16 % — ABNORMAL HIGH (ref 12–15)
WBC: 12.47 10*3/uL — ABNORMAL HIGH (ref 3.50–10.80)

## 2014-06-14 LAB — BASIC METABOLIC PANEL
Anion Gap: 8 (ref 5.0–15.0)
BUN: 14.2 mg/dL (ref 9.0–28.0)
CO2: 25 mEq/L (ref 22–29)
Calcium: 7.6 mg/dL — ABNORMAL LOW (ref 7.9–10.2)
Chloride: 104 mEq/L (ref 100–111)
Creatinine: 0.7 mg/dL (ref 0.7–1.3)
Glucose: 98 mg/dL (ref 70–100)
Potassium: 3.5 mEq/L (ref 3.5–5.1)
Sodium: 137 mEq/L (ref 136–145)

## 2014-06-14 LAB — CELL MORPHOLOGY
Cell Morphology: ABNORMAL — AB
Platelet Estimate: INCREASED — AB

## 2014-06-14 LAB — MAN DIFF ONLY
Band Neutrophils Absolute: 0.25 10*3/uL (ref 0.00–1.00)
Band Neutrophils: 2 %
Basophils Absolute Manual: 0 10*3/uL (ref 0.00–0.20)
Basophils Manual: 0 %
Eosinophils Absolute Manual: 0.5 10*3/uL (ref 0.00–0.70)
Eosinophils Manual: 4 %
Lymphocytes Absolute Manual: 1.25 10*3/uL (ref 0.50–4.40)
Lymphocytes Manual: 10 %
Monocytes Absolute: 0.5 10*3/uL (ref 0.00–1.20)
Monocytes Manual: 4 %
Neutrophils Absolute Manual: 9.98 10*3/uL — ABNORMAL HIGH (ref 1.80–8.10)
Nucleated RBC: 0 /100 WBC (ref 0–1)
Segmented Neutrophils: 80 %

## 2014-06-14 LAB — GFR: EGFR: >60

## 2014-06-14 LAB — MAGNESIUM: Magnesium: 1.7 mg/dL (ref 1.6–2.6)

## 2014-06-14 SURGERY — PICC LINE PLACEMENT
Anesthesia: Local | Site: Arm Upper | Laterality: Left

## 2014-06-14 MED ORDER — POTASSIUM CHLORIDE CRYS ER 20 MEQ PO TBCR
40.0000 meq | EXTENDED_RELEASE_TABLET | Freq: Once | ORAL | Status: AC
Start: 2014-06-14 — End: 2014-06-14
  Administered 2014-06-14: 40 meq via ORAL
  Filled 2014-06-14: qty 2

## 2014-06-14 MED ORDER — SODIUM CHLORIDE 0.9 % IJ SOLN
10.0000 mL | Freq: Every day | INTRAMUSCULAR | Status: DC
Start: 2014-06-14 — End: 2014-06-17
  Administered 2014-06-14 – 2014-06-17 (×4): 10 mL

## 2014-06-14 MED ORDER — FUROSEMIDE 10 MG/ML IJ SOLN
20.0000 mg | Freq: Once | INTRAMUSCULAR | Status: AC
Start: 2014-06-14 — End: 2014-06-14
  Administered 2014-06-14: 20 mg via INTRAVENOUS
  Filled 2014-06-14: qty 2

## 2014-06-14 MED ORDER — CHLORDIAZEPOXIDE HCL 5 MG PO CAPS
5.0000 mg | ORAL_CAPSULE | Freq: Every evening | ORAL | Status: DC
Start: 2014-06-14 — End: 2014-06-15
  Administered 2014-06-14: 5 mg via ORAL
  Filled 2014-06-14 (×2): qty 1

## 2014-06-14 MED ORDER — RISAQUAD PO CAPS
1.0000 | ORAL_CAPSULE | Freq: Every day | ORAL | Status: DC
Start: 2014-06-14 — End: 2014-06-17
  Administered 2014-06-14 – 2014-06-17 (×4): 1 via ORAL
  Filled 2014-06-14 (×4): qty 1

## 2014-06-14 MED ORDER — ALBUTEROL-IPRATROPIUM 2.5-0.5 (3) MG/3ML IN SOLN
3.0000 mL | RESPIRATORY_TRACT | Status: DC
Start: 2014-06-14 — End: 2014-06-17
  Administered 2014-06-14 – 2014-06-17 (×11): 3 mL via RESPIRATORY_TRACT
  Filled 2014-06-14 (×12): qty 3

## 2014-06-14 MED ORDER — MAGNESIUM SULFATE IN D5W 10-5 MG/ML-% IV SOLN
1.0000 g | Freq: Once | INTRAVENOUS | Status: AC
Start: 2014-06-14 — End: 2014-06-14
  Administered 2014-06-14: 1 g via INTRAVENOUS
  Filled 2014-06-14: qty 100

## 2014-06-14 NOTE — Progress Notes (Addendum)
[facility]- Critical Care Note     ICU Daily Progress Note        Date Time: 06/14/2014 3:28 AM  Patient Name: Brian Trevino  Attending Physician: Lawernce Ion, MD  Room: IC12/IC12-A   Admit Date: 06/09/2014  LOS: 5 days            Assessment:     Patient Active Problem List   Diagnosis   . Respiratory failure     Pneumonia with left lung empyema, bilateral infiltrates  Chest tube drainage decreasing in volume.  Empyema culture with Streptococcus intermedius.  Anemia of chronic disease.  Hypokalemia, repleted  Delirium improved, patient responsive, out of bed to chair, eating, but decreased appetite.  Plan:   Continue antibiotic therapy, chest tube management.  Physical therapy.  Advance oral intake  Follow blood counts    Subjective:   Responsive.  Intermittent cough    Medications:       Scheduled Meds: PRN Meds:      acetylcysteine 2 mL Nebulization Q4H SCH   albuterol-ipratropium 3 mL Nebulization Q4H SCH   cefTRIAXone 2 g Intravenous Q24H SCH   chlordiazePOXIDE 5 mg Oral BID   clindamycin 900 mg Intravenous Q8H SCH   famotidine 20 mg Oral Daily   folic acid 1 mg Oral Daily   heparin (porcine) 5,000 Units Subcutaneous Q12H [facility]   nicotine 1 patch Transdermal Daily   QUEtiapine 25 mg Oral QHS   thiamine 100 mg Oral Daily       Continuous Infusions:  . sodium chloride 50 mL/hr at 06/13/14 0319      fentaNYL 25 mcg Q2H PRN   influenza 0.5 mL Prior to discharge   LORazepam 2 mg Q1H PRN   sodium chloride 1,000 mL PRN             Physical Exam:     Filed Vitals:    06/13/14 2300 06/14/14 0000 06/14/14 0100 06/14/14 0200   BP: 113/75 121/52 118/44 119/53   Pulse: 98 93 100 99   Temp:    98.3 F (36.8 C)   TempSrc:    Temporal Artery   Resp: 23 20 21 28    Height:       Weight:       SpO2: 100% 94% 94% 95%     Temp (24hrs), Avg:98.4 F (36.9 C), Min:97.2 F (36.2 C), Max:99.2 F (37.3 C)           11/02 0701 - 11/03 0700  In: 1885 [P.O.:670; I.V.:1215]  Out: 1455 [Urine:1455]       General Appearance:  Comfortable respirations  Mental status: Alert  Neuro:  Nonfocal, able to stand up with help weightbearing.  H & N: No JVD  Lungs: Rales in the bases, left chest tubes in place, sites clean bandaged  Cardiac: Regular  Abdomen:  Benign  Extremities: Trace edema  Skin: No rash      Data:       Vent Settings:    Vent Settings  FiO2: 100 %      Labs:     Recent CBC   Recent Labs  Lab 06/13/14  0338 06/12/14  0335 06/11/14  0344   WBC 13.55* 20.40* 23.71*   RBC 2.84* 2.96* 3.11*   HEMOGLOBIN 7.9* 8.4* 8.9*   HEMATOCRIT 23.5* 24.6* 25.9*   MCV 82.7 83.1 83.3   PLATELETS 450* 464* 476*         Recent Labs  Lab 06/13/14  0338  06/13/14  0336 06/12/14  0335 06/11/14  0345  06/09/14  1308 06/09/14  1307   SODIUM 141  --  138 139  < >  --  140   POTASSIUM 3.2*  --  3.6 3.7  < >  --  4.2   CHLORIDE 108  --  107 107  < >  --  102   CO2 21*  --  19* 19*  < >  --  22   GLUCOSE 119*  --  86 97  < >  --  115*   BUN 20.3  --  24.4 23.2  < >  --  39.8*   CREATININE 0.8  --  0.8 0.9  < >  --  1.1   MAGNESIUM  --  2.0 1.8 2.0  < >  --   --    PHOSPHORUS  --  3.5  --   --   --   --   --    AST (SGOT) 50*  --   --   --   --   --  32   ALT 20  --   --   --   --   --  21   ALKALINE PHOSPHATASE 89  --   --   --   --   --  144*   BILIRUBIN, TOTAL 0.8  --   --   --   --   --  1.9*   B-NATRIURETIC PEPTIDE  --   --   --   --   --   --  135.1*   PT  --   --   --   --   --  15.9*  --    PT INR  --   --   --   --   --  1.3  --    PTT  --   --   --   --   --  34  --    TROPONIN I  --   --   --   --   --  <0.01  --    < > = values in this interval not displayed.        Rads:     Radiology Results (24 Hour)     Procedure Component Value Units Date/Time    X-ray chest AP portable [[id number]] Collected:  06/13/14 0522    Order Status:  Completed Updated:  06/13/14 0537    Narrative:      INDICATION: 80 year old male with history of empyema.  Status post  pleural drainage on 06/09/2014.                  COMPARISON: Chest radiograph from yesterday. CT  chest 06/11/2014       TECHNIQUE: XR CHEST AP PORTABLE: AP portable semierect    FINDINGS:  Smallbore thoracostomy pigtail catheters appear unchanged  with tips overlying the medial left lung base and left upper lobe  laterally. Bibasilar opacities appear grossly unchanged.  Overall lung  volumes are shallow.       The upper lobes show no focal infiltrate or  mass. An azygos fissure is seen in the right upper lobe which is a  normal anatomic variant.      No obvious pulmonary edema is seen, but  mild pulmonary edema is difficult to exclude.      No pneumothorax is  seen. There is no obvious large pleural effusion. However, small pleural  effusions  are not excluded.       The heart is not enlarged.      There  is no mediastinal shift and the upper mediastinum does not appear  widened.      The left hilar structures are not well seen.  The right  hilar structures appear unremarkable.       There is no apparent  subdiaphragmatic free air.      No significant bony abnormalities  detected.  Spinal degenerative changes are noted. Multiple cardiac  monitoring wires overlie the chest.         Impression:       Portable AP radiographic examination of the chest shows no  significant change in bibasilar opacities and left-sided chest tubes.  These opacities are likely due to a combination of factors including  basilar lung consolidation and pleural effusions. At this point, I feel  that the majority of the opacity is due to lung consolidation.   Overall  lung volumes are shallow.     There is no significant change from the  prior exam.    Miguel Dibble, MD   06/13/2014 5:32 AM            Radiological Imaging personally reviewed.    I have personally reviewed the patient's history and 24 hour interval events, along with vitals, labs, radiology images and  additional findings found in detail within ICU team notes, with their care plans developed with and reviewed by me.         Signed by: Durward Fortes, MD  Date/Time:  06/14/2014 3:28 AM

## 2014-06-14 NOTE — Progress Notes (Signed)
Double lumen picc line inserted to left Basilic vein by Zenda Alpers, C, RT.  Patient tolerated the procedure without complications.  Awaiting for chest xray for tip placement verification.  [employer]

## 2014-06-14 NOTE — Progress Notes (Signed)
Nutritional Support Services  Nutrition Assessment    Brian Trevino 80 y.o. male   MRN: [id number]    Referral Source: per policy  Reason for Referral: LOS    Nutrition Summary/Diet history:  Patient was living alone in [place] and his weight was 205#.  Since coming to stay with his daughter his weight has gradually increased to 214#.      Nutrition Diagnosis:   Inadequate oral intake related to decreased apetite as evidenced by pt interview.     Intervention:  1. Diet changed as per SLP recommendation.  2. Reviewed food choices available.     Goal: Consumes > 75% of meals prior to discharge from the hospital.    Monitoring:   Evaluation:   1. PO intake   Fair  2. Weights   stable    Nutrition risk level: Low    Assessment Data:  Adm dx:  <principal problem not specified>   Patient Active Problem List   Diagnosis   . Respiratory failure   PMH:  has a past medical history of Hyperlipemia.  PSH:  has past surgical history that includes Cataract extraction.    Recent Labs  Lab 06/14/14  0455 06/13/14  0338 06/13/14  0336 06/12/14  0335 06/11/14  0345 06/10/14  0440   SODIUM 137 141  --  138 139 139   POTASSIUM 3.5 3.2*  --  3.6 3.7 4.0   CHLORIDE 104 108  --  107 107 106   CO2 25 21*  --  19* 19* 23   BUN 14.2 20.3  --  24.4 23.2 29.2*   CREATININE 0.7 0.8  --  0.8 0.9 0.9   GLUCOSE 98 119*  --  86 97 106*   CALCIUM 7.6* 7.5*  --  7.5* 7.7* 7.8*   MAGNESIUM 1.7  --  2.0 1.8 2.0 1.9   PHOSPHORUS  --   --  3.5  --   --   --    EGFR >60.0 >60.0  --  >60.0 >60.0 >60.0   Pertinent meds: famotidine, folic acid, thiamine  Social history:  Visiting from [employer] Intolerance/Religious/Ethnic preferences:  None noted  Diet Order:  Orders Placed This Encounter   Procedures   . Diet cardiac Liquid consistency: NECTAR; Patient preferences: mech soft diet; Room service ([place] Only):: Yes   Food intake: Fair  GI symptoms: hypo BS  Hydration: WDL  . sodium chloride 50 mL/hr at 06/13/14 5409   Skin: chest  tube  Anthropometrics  Height: 180.3 cm (5\' 11" )  Weight: 91.6 kg (201 lb 15.1 oz)  Weight Change: -1.01  IBW/kg (Calculated) Male: 78.21 kg  IBW/kg (Calculated) Male: 70.43 kg  BMI (calculated): 28.2  Wt Readings from Last 30 Encounters:   06/09/14 91.6 kg (201 lb 15.1 oz)   Learning Needs: no  No Known Allergies    Geryl Councilman, RD, CNSC  [employer] - [phone]

## 2014-06-14 NOTE — Progress Notes (Signed)
PROGRESS NOTE    Date Time: 06/14/2014 11:31 AM  Patient Name: Brian Trevino,Brian Trevino      Assessment:   Left empyema/pneumonia  S/p chest tube x 2  Zero output from upper tube with resolved collection  Continues with purulent output from lower tube    Plan:   Remove upper tube - done at bedside  Flushed lower tube - small plug cleared  Cont gravity drainage of single remaining lower tube    Subjective:   Feels much better, tired    Physical Exam:     Filed Vitals:    06/14/14 1100   BP: 109/31   Pulse: 104   Temp: 98.3   Resp: 29   SpO2: 96%       Tube output:   Upper 0   Lower ~40cc pus    Tube sites C/D/I      Labs:     Recent Labs  Lab 06/14/14  0455 06/13/14  0338 06/12/14  0335   WBC 12.47* 13.55* 20.40*   HEMOGLOBIN 8.0* 7.9* 8.4*   HEMATOCRIT 23.6* 23.5* 24.6*   PLATELETS 428* 450* 464*       Recent Labs  Lab 06/14/14  0455 06/13/14  0338 06/12/14  0335  06/09/14  1307   GLUCOSE 98 119* 86  < > 115*   BUN 14.2 20.3 24.4  < > 39.8*   CREATININE 0.7 0.8 0.8  < > 1.1   SODIUM 137 141 138  < > 140   CO2 25 21* 19*  < > 22   ALBUMIN  --  1.0*  --   --  1.6*   AST (SGOT)  --  50*  --   --  32   ALT  --  20  --   --  21   < > = values in this interval not displayed.    Recent Labs  Lab 06/09/14  1308   PT 15.9*   PT INR 1.3   PTT 34             Rads:   Reviewed.      Signed by: Pollyann Kennedy, MD    [facility]   [facility]  Office [phone]   [place] - [phone]   Contact numbers - [facility] - [phone]  [facility] - [phone]  [facility]- [phone]  [facility] - [phone]  [facility] [phone]  [facility] [phone]

## 2014-06-14 NOTE — OT Eval Note (Signed)
[facility]  [address]  [place], [place]. [address]    Department of Rehabilitation Services  [phone]    Occupational Therapy Evaluation    Patient: Brian Trevino    MRN#: [id number]     IC12/IC12-A    Time of treatment: Time Calculation  OT Received On: 06/14/14  Start Time: 1235  Stop Time: 1305  Time Calculation (min): 30 min       Consult received for Brian Trevino for OT Evaluation and Treatment.  Patient's medical condition is appropriate for Occupational therapy intervention at this time.    Assessment:   Brian Trevino is a 80 y.o. male admitted 06/09/2014 presenting with respiratory failure; B/L PNA.  Impairments: Assessment: decreased strength;balance deficits;decreased independence with ADLs;decreased safety awareness;decreased ROM    Therapy Diagnosis: generalized weakness, decreased functional mobility  and decreased independence with ADL's due to current HPI. Without therapy interventions, patient is at risk for falls and failure to return to PLOF.    Rehabilitation Potential: Prognosis: Fair;With continued OT s/p acute discharge      Plan:   OT Frequency Recommended: 2-3x/wk   Treatment Interventions: ADL retraining;Functional transfer training;UE strengthening/ROM;Endurance training;Patient/Family training     Patient Goal  Patient Goal:  (to get back to bed)    Risks/Benefits/POC Discussed with Pt/Family: With patient/family    Goals:   Goal Formulation: Patient  Time For Goal Achievement: by time of discharge  ADL Goals  Patient will feed self: Independent;5 visits  Patient will groom self: Supervision;at edge of bed;5 visits  Patient will dress lower body: Minimal Assist;5 visits  Patient will toilet: Minimal Assist;5 visits  Mobility and Transfer Goals  Pt will transfer bed to [facility]: Minimal Assist;with rolling walker;5 visits                         Discharge Recommendations:   Based on today's session patient's discharge recommendation is the following: Discharge  Recommendation: SNF.     If Discharge Recommendation: SNF is not available, then the patient will need home health services, 24/7 supervision, assistance with mobility and assistance with ADL's.           Precautions and Contraindications: Falls Risk; L sided chest tube          Medical Diagnosis: Sepsis, due to unspecified organism [A41.9]  Pneumonia of both lower lobes due to infectious organism [J16.8]  Abscess of lower lobe of left lung with pneumonia [J85.1]  Abscess of left lung without pneumonia, unspecified part of lung [J85.2]    History of Present Illness: Brian Trevino is a 80 y.o. male admitted on 06/09/2014 with "history of hyperlipidemia visiting from [place] who presents to the hospital with respiratory failure. Patient arrived via plane 2 days prior to admission.      Confused, tremulousness.     Hospital Course  10/29-  Larey Seat coming out of shower w/ bloody cough for several days--Sepsis , Abscess Lt lung w/  loculated Pleural effusion  (wbc=18.78, lactic=2.6, Flu=neg) ,   IR s/p x2 CT--#1~ Lt upper anterolateral pigtail  and   #2 ~ Lt posterior Lower=    10/30 replaced Lt upper anterior then tpa placed in line  10/31 Off Precedex gtt  11/1 Up in chair, confused, tremulous"-as per Dr. Marigene Ehlers note on 06/12/14.       Patient Active Problem List   Diagnosis   . Respiratory failure        Past Medical/Surgical History:  Past Medical History   Diagnosis Date   . Hyperlipemia       Past Surgical History   Procedure Laterality Date   . Cataract extraction           Social History:  Prior Level of Function  Prior level of function: Independent with ADLs, Ambulates independently (has used a cane in past 2 wks s/p fall x2)  Baseline Activity Level: Community ambulation, Household ambulation  Driving: independent  Cooking: light meal prep  Employment: Retired  DME Currently at [employer]: Single point cane  Home Living Arrangements  Living Arrangements: Children (in [place]. Visiting other dtr in area here)  Type of Home:  House  Home Layout: One level (no STE)  Bathroom Shower/Tub: Event organiser: Built-in shower seat, Grab bars around toilet  DME Currently at Home: Single point cane  Home Living - Notes / Comments: Pt had 1 fall in shower and another fall within past 2 weeks      Subjective:   Patient is agreeable to participation in the therapy session. Nursing clears patient for therapy.  Subjective:  (Patient asleep in arm-chair upon arrival however easily aroused but drifted back to sleep at times upon awakening. Patient's 2 daughters at bedside stating Patient has been OOB since around 7 a.m. And currently was requesting to transfer back to bed)  Pain Assessment  Pain Assessment: No/denies pain.        Objective:   Observation of Patient/Vital Signs:  Patient is seated in a bedside chair with telemetry, peripheral IV, O2 via nasal cannula, L sided chest tube and indwelling urinary catheter in place.         Cognition  Arousal/Alertness: Appropriate responses to stimuli (Patient's eyes closing intermittently during the session due)  Attention Span: Attends to task with redirection  Orientation Level: Oriented to person (further orientation questions not asked)  Memory: Decreased short term memory;Decreased recall of recent events  Following Commands: minimal verbal instruction  Safety Awareness: moderate verbal instruction  Neuro Status  Behavior: calm;cooperative (lethargic)  Hand Dominance: right handed    Gross ROM  Right Upper Extremity ROM: within functional limits  Left Upper Extremity ROM: within functional limits  Gross Strength  Right Upper Extremity Strength:  (Shoulder <3/5; elbow 3+/5; grip 4/5)  Left Upper Extremity Strength:  (Shoulder<3/5; elbow 3+/5; grip 4/5)          Sensory  Auditory: intact  Tactile - Light Touch: intact (as per Patient report)  Visual Acuity: wears glasses       Self-care and Home Management  Toileting: Dependent;clothing management up;clothing management down (performed  at bed level with supine to roll to R and L sides )    Mobility and Transfers  Rolling: Maximal Assist;to Left;to Right  Scooting to HOB: Dependent--2 person assist for safety  Sit to Supine: Maximal Assist; needed assistance for trunk and B LE mgmt  Sit to Stand: Moderate Assist from arm-chair  Bed to Chair: Moderate Assist (w/ rw; needed assistance to maneuver rw and to manage multiple IV lines/tubing)  -verbal/tactile instructions for proper hand placement and pacing with all functional transfers for increased safety.      Balance  Static Sitting Balance: fair  Static Standing Balance: fair (w/ rw)  Dynamic Standing Balance: fair (w/ rw)    Participation and Endurance  Participation Effort: fair  Endurance: Tolerates 10 - 20 min exercise with multiple rests; Patient denied feeling lightheaded or dizzy during the session.  [place] Dyspnea Scale: 2+ Dyspnea    Treatment Activities: Pt. Instructed to perform B/L UE AROM exercises for shoulder, elbow and digit F/E intermittently throughout the day to increase endurance and strength for ADL's with visual demonstration provided to increase clarity. Patient's 2 daughter present for education and encouraged to assist Patient in performing UB exercises during the day. Patient educated in importance of OOB sitting for all meals to increase endurance/strength for activities and for pulmonary hygiene. Patient incontinent of bowel upon standing from arm-chair. Patient requesting to transfer into bed for bottom hygiene clean-up due to fatigue levels. Noted SOB with exertion.Educated in pursed lip breathing techniques via verbal/visual demonstration for increased clarity. Patient's O2 sats 93% on O2 via NC at end of session. Patient positioned in bed with B UEs positioned on pillows at end of session for edema mgmt.     Educated the patient to role of occupational therapy, plan of care, goals of therapy and safety with mobility and ADLs.      Tennis Ship. Trixie Deis,  MS,OTR/L  Pager # [phone]  [phone]

## 2014-06-14 NOTE — Progress Notes (Signed)
Infectious Disease            Progress Note    06/14/2014   Brian Trevino YQM:[id number]:[id number] is a 80 y.o. male with past medical history significant for hyperlipidemia, who is admitted for bilateral pneumonia, sepsis and empyema.     Subjective:     Brian Trevino today Symptoms:  Stable. Much improved. One chest tube removed.  Feeling better. Cultures growing strep intermedius. Afebrile. No diarrhea, abdominal pain. Other review of system is non contributory.    Objective:     Blood pressure 137/59, pulse 99, temperature 98.3 F (36.8 C), temperature source Temporal Artery, resp. rate 22, height 1.803 m (5\' 11" ), weight 91.6 kg (201 lb 15.1 oz), SpO2 95 %.    General Appearance: Comfortable; chest tube is in place  HEENT: Pallor negative, Anicteric.     Lungs: Decreased breath sounds. Bilateral scattered crackles, chest tube in place  Heart:  S1 and S2.   Chest Wall: Symmetric chest wall expansion.   Abdomen: Obese, bowel sounds are positive.  Neurological: Alert, awake, moves all extremities    Laboratory And Diagnostic Studies:     Recent Labs      06/14/14   0455  06/13/14   0338   WBC  12.47*  13.55*   HEMOGLOBIN  8.0*  7.9*   HEMATOCRIT  23.6*  23.5*   PLATELETS  428*  450*   NEUTROPHILS  80  84     Recent Labs      06/14/14   0455  06/13/14   0338   SODIUM  137  141   POTASSIUM  3.5  3.2*   CHLORIDE  104  108   CO2  25  21*   BUN  14.2  20.3   CREATININE  0.7  0.8   GLUCOSE  98  119*   CALCIUM  7.6*  7.5*     Recent Labs      06/13/14   0338   AST (SGOT)  50*   ALT  20   ALKALINE PHOSPHATASE  89   PROTEIN, TOTAL  4.7*   ALBUMIN  1.0*   BILIRUBIN, TOTAL  0.8       Current Med's:     Current Facility-Administered Medications   Medication Dose Route Frequency   . acetylcysteine  2 mL Nebulization Q4H SCH   . albuterol-ipratropium  3 mL Nebulization Q4H SCH   . cefTRIAXone  2 g Intravenous Q24H [facility]   . chlordiazePOXIDE  5 mg Oral QHS   . clindamycin  900 mg Intravenous Q8H SCH   . famotidine  20  mg Oral Daily   . folic acid  1 mg Oral Daily   . heparin (porcine)  5,000 Units Subcutaneous Q12H [facility]   . QUEtiapine  25 mg Oral QHS   . thiamine  100 mg Oral Daily         Assessment:      Condition.  Improving.     Systemic inflammatory response syndrome, sepsis.   Strep intermedius infection   Bilateral pneumonia.   Respiratory failure   Empyema: status post chest tube placements   Hyperlipidemia.    Plan:      Continue  rocephin   Continue  clindamycin   Continue supportive care   Interventional radiology followup   Discussed with patient's daughter             Alfonzo Beers, M.D.,FACP  06/14/2014  10:13 AM

## 2014-06-14 NOTE — Progress Notes (Signed)
[facility]- Critical Care Note     ICU Daily Progress Note        Date Time: 06/14/2014 12:53 PM  Patient Name: Brian Trevino  Attending Physician: Lawernce Ion, MD  Room: IC12/IC12-A   Admit Date: 06/09/2014  LOS: 5 days        Assessment/Plan:     Neuro:  Incidental finding of possible sella mass? In head ct. Will need to follow up as outpt .    Delirium, possibly from icu psychosis?      Cont  librium 5mg  po qhs and seroquel to help with delirium.  Give vitamins. Family states pt does not drink alcohol daily.     Resp: respiratory failure from Community-acquired pneumonia complicated by empyema- Strep. Intermedius pneumonia.  Appreciate IR chest tube placement. Agree with discontinue anterior chest tube.  Cont posterior chest tube.     Nutrition: passed swallow evaluation, cont regular diet    Infectious Disease (ID): Community-acquired pneumonia complicated by empyema- strep intermedius pneumonia.  Appreciate Dr. Myrtis Ser evaluation recommendation. Cont  clindamycin and ceftriaxone.     Hem/Onc: Anemia. No signs of active bleed    Renal /Fluid, Electrolytes : Acute kidney injury- improved.     Prophylaxis:   GI Prophylaxis: +  VTE Prophylaxis:+      Code Status: full code    Discussed with patient's daughter from [place] at the bedside and answered her questions.      stable to transfer to floor.     Subjective:   80 y.o. male history of hyperlipidemia visiting from [place] who presents to the hospital with respiratory failure. Patient arrived via plane 2 days prior to admission.      Pt sleepy after physical therapy.  Easily awaken with voice. Able to answer questions. No shortness of breath.     Hospital Course  10/29-  Larey Seat coming out of shower w/ bloody cough for several days--Sepsis , Abscess Lt lung w/  loculated Pleural effusion  (wbc=18.78, lactic=2.6, Flu=neg) ,   IR s/p x2 CT--#1~ Lt upper anterolateral pigtail  and   #2 ~ Lt posterior Lower=   10/30 replaced Lt upper anterior then tpa  placed in line  10/31 Off Precedex gtt  11/1 Up in chair, confused, tremulous, start low dose librium  11/2 seroquel at night  11/3 left chest tube removed      Medications:   Scheduled Meds:  Current Facility-Administered Medications   Medication Dose Route Frequency   . acetylcysteine  2 mL Nebulization Q4H [facility]   . albuterol-ipratropium  3 mL Nebulization Q4H [facility]   . cefTRIAXone  2 g Intravenous Q24H SCH   . chlordiazePOXIDE  5 mg Oral QHS   . clindamycin  900 mg Intravenous Q8H SCH   . famotidine  20 mg Oral Daily   . folic acid  1 mg Oral Daily   . heparin (porcine)  5,000 Units Subcutaneous Q12H [facility]   . lactobacillus/streptococcus  1 capsule Oral Daily   . QUEtiapine  25 mg Oral QHS   . thiamine  100 mg Oral Daily         Continuous Infusions:  . sodium chloride 50 mL/hr at 06/13/14 0319          Physical Exam:     Filed Vitals:    06/14/14 1200   BP: 123/51   Pulse: 100   Temp:    Resp: 24   SpO2: 95%  Intake/Output Summary (Last 24 hours) at 06/14/14 1253  Last data filed at 06/14/14 0956   Gross per 24 hour   Intake   2115 ml   Output   2750 ml   Net   -635 ml     General Appearance: improved alertness, still confused  Mental status:  Oriented to person and place. Stated "2012" for year.   Neuro: improved tremulous in his hands, generalized weakness  Neck:supple, no significant adenopathy  Lungs: improved rhonchi left lower lung field  Cardiac: ns1, ns2, 2/6 SEM at apex, no rub/gallop  Abdomen:  Obese, nontender, nondistended  Extremities: no lower extremity edema  Skin: no rash, 1 left sided chest tube site intact and dressing clean      Labs:     Labs (last 72 hours):  Recent Labs      06/14/14   0455  06/13/14   0338  06/12/14   0335   WBC  12.47*  13.55*  20.40*   HEMOGLOBIN  8.0*  7.9*  8.4*   HEMATOCRIT  23.6*  23.5*  24.6*     No results for input(s): PT, INR, PTT in the last 72 hours. Recent Labs      06/14/14   0455  06/13/14   0338  06/13/14   0336  06/12/14   0335   SODIUM  137  141   --    138   POTASSIUM  3.5  3.2*   --   3.6   CHLORIDE  104  108   --   107   CO2  25  21*   --   19*   BUN  14.2  20.3   --   24.4   CREATININE  0.7  0.8   --   0.8   GLUCOSE  98  119*   --   86   CALCIUM  7.6*  7.5*   --   7.5*   MAGNESIUM  1.7   --   2.0  1.8   PHOSPHORUS   --    --   3.5   --                      Rads:         Radiological Imaging personally reviewed,and agree with radiology report including:       CXR   06/12/2014    Multifocal pneumonia, 2 left sided pigtail in place    I have personally reviewed the patient's history and 24 hour interval events, along with vitals, labs, radiology images and nursing.         Signed by: Lawernce Ion, MD  Date/Time: 06/14/2014 12:53 PM

## 2014-06-14 NOTE — Progress Notes (Signed)
VSS, pt to be transferred to floor with telemetry monitoring, family aware, no questions at this time.  Left chest tube intact, dsd, no output from chest tube.  Report to floor charge nurse.  Left PICC line intact, both ports flushed.

## 2014-06-14 NOTE — Plan of Care (Addendum)
Upper chest tube on left side taken out by Dr Sallee Lange, IR department, Also lower tube flushed clog and still on closed suction, pt tolerated procedure, dsd to chest tube and dsd over discontinued site.  VSS, family at bedside, no questions by pt at this time.  oob to chair.

## 2014-06-14 NOTE — Plan of Care (Signed)
Problem: Tissue integrity  Goal: Damaged tissue is healing and protected  Intervention: Reposition patient every 2 hours and PRN unless able to self reposition.  Pt skin monitored and skin care given.

## 2014-06-14 NOTE — PT Progress Note (Signed)
[facility]  [address]  [place], [place]. [address]    Department of Rehabilitation  [phone]    Physical Therapy Daily Treatment Note    Patient: Brian Trevino    MRN#: [id number]   Unit: [id number]  Time of Treatment: Start Time: 1230 Stop Time: 1303   Time Calculation: 33 min   (24 PT Tx min)  PT Visit Number: 2  Patient's medical condition is appropriate for Physical Therapy intervention at this time.  Precautions: falls, chest tube, multiple lines and tubes in ICU setting    Assessment:  Pt presents with decreased functional endurance and strength.  Pt will benefit from continued in-patient PT to address the following deficits: Decreased endurance/activity tolerance;Decreased LE strength and UE strength;Decreased balance;Decreased functional mobility;Gait impairment     Progress: Slow progress, decreased activity tolerance   Patient Goal: Per family pt is very motivated to get strength back    Plan:  Continue with Physical Therapy services to address functional mobility, endurance, and strength deficits.  Focus next session on: Bed mobility; Functional transfer training; LE strengthening Exercise; and Endurance training   PT Frequency: 3-4x/wk     Based on today's session patient's discharge recommendation is the following: Discharge Recommendation: SNF.   If Discharge Recommendation: SNF is not available, then the patient will need home health services, 24/7 supervision, assistance with mobility and assistance with ADL's.       Subjective: Patient is agreeable to participation in the therapy session.  Nursing clears patient for therapy.  Family reports pt fatigued this afternoon, report he sat up on BSC attempting to have a BM for a while earlier, and has been in chair since this morning.  Pain Assessment: No/denies pain     Objective:  Observation of patient vital signs performed by nursing staff, see flow chart for details.  Patient is seated in a bedside chair sleeping.  Pt with telemetry,  peripheral IV, BP cuff, O2  via nasal cannula, L sided chest tube, and indwelling urinary catheter in place.   Pt seen for functional activities and exercises as noted:     Cognition  Arousal/Alertness: Delayed responses to stimuli, pt very fatigued from sitting up since this morning, eyes closing t/o session.    Functional Mobility and Transfers  Rolling: Maximal Assist R and L  Sit to Supine: Maximal Assist to left, to control trunk and to lift LE's onto mattress  Sit to Stand: Moderate Assist from low chair  Stand to Sit: Moderate Assist to control descent  Bed to Chair: Maximal Assist with RW, pt able to take shuffling turning steps, assistance with walker and balance     Therapeutic Exercise  Ankle Pumps:  B x 10-12   LAQ:  B x 10  Pt instructed in proper pacing and performance of ex's.     Neuro Re-Ed  Sitting Balance: minimal assist/contact guard assist sitting at edge of bed ~45 seconds  Standing Balance: moderate assist with support of RW and with instruction     Treatment Activities: Bed mobility, transfers, balance assessment, LE ROM and strength ex's, safety awareness and cognition assessed, and activity tolerance noted.    Educated the patient to role of physical therapy, plan of care, goals of therapy and Encouraged pt to perform LE AE there ex throughout the day to decrease effects of immobility and increase circulation. Patient left reclined in bed, without needs, SCD's in place, call bell within reach, family members present.   RN notified of session  outcome.

## 2014-06-15 ENCOUNTER — Inpatient Hospital Stay: Payer: Medicare Other

## 2014-06-15 ENCOUNTER — Encounter
Admission: EM | Disposition: A | Discharge status: Skilled Nursing Facility | Payer: Self-pay | Source: Home / Self Care | Attending: Internal Medicine

## 2014-06-15 DIAGNOSIS — B953 Streptococcus pneumoniae as the cause of diseases classified elsewhere: Secondary | ICD-10-CM

## 2014-06-15 DIAGNOSIS — J869 Pyothorax without fistula: Secondary | ICD-10-CM

## 2014-06-15 DIAGNOSIS — J189 Pneumonia, unspecified organism: Secondary | ICD-10-CM

## 2014-06-15 DIAGNOSIS — A491 Streptococcal infection, unspecified site: Secondary | ICD-10-CM

## 2014-06-15 DIAGNOSIS — R410 Disorientation, unspecified: Secondary | ICD-10-CM

## 2014-06-15 DIAGNOSIS — J153 Pneumonia due to streptococcus, group B: Secondary | ICD-10-CM

## 2014-06-15 LAB — CBC AND DIFFERENTIAL
Hematocrit: 25.1 % — ABNORMAL LOW (ref 42.0–52.0)
Hematocrit: 25.2 % — ABNORMAL LOW (ref 42.0–52.0)
Hgb: 8.3 g/dL — ABNORMAL LOW (ref 13.0–17.0)
Hgb: 8.4 g/dL — ABNORMAL LOW (ref 13.0–17.0)
MCH: 27.5 pg — ABNORMAL LOW (ref 28.0–32.0)
MCH: 27.7 pg — ABNORMAL LOW (ref 28.0–32.0)
MCHC: 33.1 g/dL (ref 32.0–36.0)
MCHC: 33.3 g/dL (ref 32.0–36.0)
MCV: 83.1 fL (ref 80.0–100.0)
MCV: 83.2 fL (ref 80.0–100.0)
MPV: 9.8 fL (ref 9.4–12.3)
MPV: 9.2 fL — ABNORMAL LOW (ref 9.4–12.3)
Platelets: 426 10*3/uL — ABNORMAL HIGH (ref 140–400)
Platelets: 392 10*3/uL (ref 140–400)
RBC: 3.02 10*6/uL — ABNORMAL LOW (ref 4.70–6.00)
RBC: 3.03 10*6/uL — ABNORMAL LOW (ref 4.70–6.00)
RDW: 16 % — ABNORMAL HIGH (ref 12–15)
RDW: 16 % — ABNORMAL HIGH (ref 12–15)
WBC: 13.32 10*3/uL — ABNORMAL HIGH (ref 3.50–10.80)
WBC: 12.6 10*3/uL — ABNORMAL HIGH (ref 3.50–10.80)

## 2014-06-15 LAB — BASIC METABOLIC PANEL
Anion Gap: 8 (ref 5.0–15.0)
BUN: 15.4 mg/dL (ref 9.0–28.0)
CO2: 27 mEq/L (ref 22–29)
Calcium: 7.9 mg/dL (ref 7.9–10.2)
Chloride: 103 mEq/L (ref 100–111)
Creatinine: 0.7 mg/dL (ref 0.7–1.3)
Glucose: 95 mg/dL (ref 70–100)
Potassium: 4 mEq/L (ref 3.5–5.1)
Sodium: 138 mEq/L (ref 136–145)

## 2014-06-15 LAB — MAGNESIUM: Magnesium: 1.7 mg/dL (ref 1.6–2.6)

## 2014-06-15 LAB — CELL MORPHOLOGY
Cell Morphology: ABNORMAL — AB
Platelet Estimate: INCREASED — AB

## 2014-06-15 LAB — MAN DIFF ONLY
Atypical Lymphocytes %: 1 %
Atypical Lymphocytes Absolute: 0.13 10*3/uL — ABNORMAL HIGH
Band Neutrophils Absolute: 0.13 10*3/uL (ref 0.00–1.00)
Band Neutrophils: 1 %
Basophils Absolute Manual: 0 10*3/uL (ref 0.00–0.20)
Basophils Manual: 0 %
Eosinophils Absolute Manual: 0.38 10*3/uL (ref 0.00–0.70)
Eosinophils Manual: 3 %
Lymphocytes Absolute Manual: 0.76 10*3/uL (ref 0.50–4.40)
Lymphocytes Manual: 6 %
Monocytes Absolute: 0.38 10*3/uL (ref 0.00–1.20)
Monocytes Manual: 3 %
Neutrophils Absolute Manual: 10.84 10*3/uL — ABNORMAL HIGH (ref 1.80–8.10)
Nucleated RBC: 0 /100 WBC (ref 0–1)
Segmented Neutrophils: 86 %

## 2014-06-15 LAB — GFR: EGFR: >60

## 2014-06-15 SURGERY — CHEST TUBE REMOVAL
Site: Chest

## 2014-06-15 MED ORDER — QUETIAPINE FUMARATE 25 MG PO TABS
12.5000 mg | ORAL_TABLET | Freq: Every evening | ORAL | Status: DC
Start: 2014-06-16 — End: 2014-06-16

## 2014-06-15 NOTE — Progress Notes (Addendum)
[facility] Hospitalist Daily Progress Note        Date Time: 06/15/2014  9:00 PM  Patient Name:Brian Trevino  UJW:[id number]  PCP: Brian Lager, MD  Attending Physician:Qusai Kem Levin Erp M.D.      CHIEF COMPLAINT      Chief Complaint   Patient presents with   . Shortness of Breath       SUBJECTIVE     [name] today:  Daughter at bedside   seems coherent   denies chest pain or sob   lives in [place] in [place]. Daughter lives close by. Visiting daughter here in [place]. Was confused in the morning per nurse, but now ok and  Seems to be oriented to place , person as well to time.  Seems some resting tremor when holding the hand and also some rigidity  All other organ systems are reviewed and unremarkable.        Assessment/Plan     Active Diagnosis: Active Problems:    Respiratory failure    Pneumonia    Streptococcus pneumoniae    Empyema lung    Delirium    . Resp failure at admission improved    . Resp. Failure 2 o  Bilateral multifocal Community acquired pneumonia by Streptococcus intermidius complicated by empyema. No fevers. Clinically improving. On Rocephin & clinda now. On duoneb, acapella device. On Risaquad probiotic    . Left sided Empyema 2o above with pleural fluid cultures strep. Intermedius pneumonia. Blood cultures showed NO growth. Id on board. Abx changed to rocephin & clindamycin. Treated with chest tube. Recived initial chest tube on 10/29 and then followed by Tpa administartion in to the tube for breaking loculations and placement of another chest tube for better drainage. The anterior chest tube removed yesterday. The posterior one will be removed today. Received picc line 06/14/14    . Improving leucocytosis. No fvers, monitor    . Anemai . Monitoe. Clinically stable. No obvious signs of bleed.    . Delirium , multifactorial 2o sepsis,  Advanced age, Not sure if any underlying alcohol withdrawal. Needed use of precedex in icu.  Now on seroquel, librium. Taper gradually.  Also on thiamine & folvite    . Mild Acute renal failure at admission 2osepsis and dehydration, resolved     . Incidental finding of possible sella mass? In head ct. Will need to follow up as outpt . (Slight expansion of the sella turcica with soft tissue within it.Findings could indicate a sella turcica mass. This if clinically indicated could be better evaluated with an MRI. )         GI Prophylaxis: pepcid  VTE Prophylaxis: heparin      Code Status: full code    Will d/c iv fluids    -----------------------------------  Hpi Details :  Brian Trevino is a 80 y.o. male history of hyperlipidemia visiting from [place] who presents to the hospital with respiratory failure. Patient arrived via plane 2 days prior to admission. He had worsening shortness of breath approximately one month prior with increasing productive sputum. He states that he went to his primary care physician who prescribed codeine. He had not been exposed to antibiotics these past few months. Patient noted fever and chills. Family noted patient was confused with increasing sputum production. When patient's told family. He was unable to brief. They called 911. She was noted to be in respiratory distress and hypoxic. Chest CT scan indicated large lung abscess. Patient underwent  chest tube placement by interventional radiology with good success. Approximately of pus came from chest tube. Patient state that he felt better after chest tube placement. Improvement in shortness of breath. He has mild right diaphragmatic pain from cough.       Hosp. Course:  Found to have pneumoinia with concern fr abcess Vs empyema. Evaluated by Ir and chest tube placed. Pleural fluid grew streptocococcus intermedius. Blood cultures negative. Initially tretaed with vanco, zosyn & levaquin. Later changed abx to rocephin 7 clinda based on sensitivities. Id - dr. Ralene Cork. Patient has delirium in icu requiring use of precededx  drip. Now resolved. Chest tubes finally removed as drainage improved. Received picc line      ---------------------          DVT Prohylaxis:heparin  Code Status: Full Code   Disposition: rehab  Prognosis:fair at this point  Type of Admission:Inpatient  Estimated Length of Stay (including stay in the ER receiving treatment): 1-2 days  Medical Necessity for stay:pneumonia, empyema, chest tubes        OBJECTIVE:     Blood pressure 134/62, pulse 97, temperature 98.6 F (37 C), temperature source Temporal Artery, resp. rate 24, height 1.803 m (5\' 11" ), weight 91.6 kg (201 lb 15.1 oz), SpO2 93 %.      Coherent, alert aake, oriented, no apparent distress, comfortale, slight tremor at rest, slight increased tone with slight cogwheel?, oral mucosa moist, no jvd, regular heart sounds, dedcreased breath sounds on left chest, no apparent wheeze, soft nontender abdomene, bs heard,  Noedema, warm peripheries    General Appearance: Comfortable, well-appearing and in no acute respiratory distress.    HEENT: AT/ NC, oral mucosa is moist with no lesions.  Pupils are equal, round, and reactive to light.   Lungs:  Normal respiratory rate and normal effort.    Breath sounds clear to auscultation in all lung fields.  No wheezes, rales, rhonchi or decreased breath sounds.    Heart: Normal rate.  S1 normal and S2 normal. No Murmurs, Rubs or gallops audible  Chest: Symmetric chest wall expansion.  Abdomen: Abdomen is soft, scaphoid and non-distended.  There are no signs of ascites. Bowel sounds are normal.  There is no abdominal tenderness in all four quadrants and epigastrium.  There is no palpable mass.   Neurological: Patient is alert and oriented to person, place and time.  Normal motor strength in all extremities.    Extremities: No pretibial edema, dorsalis pedis pulses palpable  Skin:  Warm and dry.  No rash or ecchymosis.    DIAGNOSTIC LAB STUDY     Recent Labs      06/15/14   0630  06/15/14   0515   WBC  12.60*  13.32*    HEMOGLOBIN  8.4*  8.3*   HEMATOCRIT  25.2*  25.1*   PLATELETS  392  426*   MCV  83.2  83.1     Recent Labs      06/15/14   0630  06/14/14   0455   06/13/14   0336   SODIUM  138  137   < >   --    POTASSIUM  4.0  3.5   < >   --    CHLORIDE  103  104   < >   --    CO2  27  25   < >   --    BUN  15.4  14.2   < >   --  CREATININE  0.7  0.7   < >   --    GLUCOSE  95  98   < >   --    CALCIUM  7.9  7.6*   < >   --    MAGNESIUM  1.7  1.7   --   2.0   PHOSPHORUS   --    --    --   3.5    < > = values in this interval not displayed.     Recent Labs      06/13/14   0338   AST (SGOT)  50*   ALT  20   ALKALINE PHOSPHATASE  89   PROTEIN, TOTAL  4.7*   ALBUMIN  1.0*       DIAGNOSTIC IMAGING STUDIES   Radiological Procedure reviewed.  Radiology Results (24 Hour)     Procedure Component Value Units Date/Time    PICC Line Placement [[id number]] Collected:  06/15/14 1652    Order Status:  Completed Updated:  06/15/14 1657    Narrative:      HISTORY:  Requires IV access for antibiotics.    PICC line insertion at the bedside in the IR Recovery:    PROCEDURE: The nature of the procedure, risks, benefits, and  alternatives were discussed with patient's daughter. All questions were  answered and consent was obtained.    This procedure was performed by Berline Lopes, RT under my supervision.    Ultrasound was used to confirm patency of the superficial vein prior to  puncture.  Using standard sterile technique and 1% lidocaine anesthesia,  puncture of the vein was performed with a 21 gauge single wall needle  under direct sonographic guidance.  Sonographic images were recorded  pre- and post- puncture for documentation.   A 0.018 inch guidewire was  advanced into the vessel. The needle was removed and a peel-away sheath  was placed. Anatomic landmarks were used to estimate appropriate  catheter length. The catheter was then trimmed and was advanced through  the peel-away sheath. The sheath was removed. Excellent blood return and  fluid  infusion was confirmed.  The lumen was flushed and capped. The  catheter was secured with an adhesive device and a sterile dressing was  applied.       Impression:       Successful placement of a 5 French double lumen PICC  catheter via left basilic venous approach at bedside in recovery under  ultrasound guidance. 47 cm catheter length was used. A chest x-ray will  be obtained for tip localization.    Lemar Livings, MD   06/15/2014 4:53 PM      Chest Tube Removal [[id number]] Resulted:  06/15/14 1532    Order Status:  Sent Updated:  06/15/14 1537    X-ray chest AP portable [[id number]] Collected:  06/15/14 0822    Order Status:  Completed Updated:  06/15/14 0830    Narrative:      HISTORY: Follow-up pneumonia in this patient admitted with sepsis and  empyema.    COMPARISON: 06/14/2014 and additional exams dating to chest CT of  06/09/2014.    FINDINGS: Single portable view of the chest was obtained. Cardiac size  is unchanged. A left PICC terminates in the region of the mid superior  vena cava. A pleural drainage catheter remains in the left lower lung.  Patchy multifocal bibasilar and lingular airspace opacities persist.  Trace effusions are suspected. There is no pneumothorax.  Impression:         1. No significant change in lines and tubes.  2. Unchanged multifocal pneumonia.  3. Negative for pneumothorax.    Elizebeth Koller, MD   06/15/2014 8:26 AM            CURRENT MEDICATIONS     Current Facility-Administered Medications   Medication Dose Route Frequency   . albuterol-ipratropium  3 mL Nebulization Q4H [facility]   . cefTRIAXone  2 g Intravenous Q24H [facility]   . chlordiazePOXIDE  5 mg Oral QHS   . clindamycin  900 mg Intravenous Q8H [facility]   . famotidine  20 mg Oral Daily   . folic acid  1 mg Oral Daily   . heparin (porcine)  5,000 Units Subcutaneous Q12H [facility]   . lactobacillus/streptococcus  1 capsule Oral Daily   . QUEtiapine  25 mg Oral QHS   . sodium chloride (PF)  10 mL Intracatheter Daily   . thiamine  100 mg Oral  Daily       LINES, DRAINS, AIRWAY     Chest Tube 2 Left Pleural 12 Fr. (Active)   Intervention/Status -20 cm H2O 06/15/2014  8:53 AM   Chest Tube Air Leak No 06/15/2014  8:53 AM   Patency Intervention Tip/tilt 06/09/2014  5:43 PM   Drainage Description Other (Comment) 06/15/2014  8:53 AM   Dressing Status Clean;Dry;Intact 06/15/2014  8:53 AM   Site Assessment Other (Comment) 06/15/2014  8:53 AM   CT Output (mL) 0 mL 06/15/2014  7:00 AM   Number of days:6       Urethral Catheter Non-latex 16 Fr. (Active)   Catheter necessity reviewed? Yes 06/15/2014  8:53 AM   Site Assessment Clean;Intact 06/15/2014  8:53 AM   Pericare (With foley) Yes 06/15/2014  8:53 AM   Urine Catheter Care Yes 06/15/2014  8:53 AM   Collection Container Standard drainage bag 06/15/2014  8:53 AM   Securement Method Stat lock 06/15/2014  8:53 AM   Reason for Continuing Urinary Catheterization past POD 1 Accurate I/O for ICU patient who is hemodynamically unstable 06/15/2014  8:53 AM   Positioned catheter tubing for unobstructed urine flow: Yes 06/15/2014  8:53 AM   Output (mL) 650 mL 06/15/2014  3:15 PM   Number of days:4       [REMOVED] Chest Tube 1 Left Pleural;Midaxillary 10 Fr. (Removed)   Removed 06/10/14 1126   Intervention/Status -20 cm H2O 06/10/2014  8:00 AM   Chest Tube Air Leak No 06/10/2014  8:00 AM   Patency Intervention Tip/tilt 06/09/2014  5:43 PM   Drainage Description Serosanguinous 06/10/2014  8:00 AM   Dressing Status Clean;Intact;Dry 06/10/2014  8:00 AM   Site Assessment Clean;Dry;Intact 06/10/2014  8:00 AM   CT Output (mL) 4 mL 06/10/2014  6:00 AM   Number of days:1       [REMOVED] Chest Tube 1 Left Midaxillary  (Removed)   Removed 06/14/14 1000   Intervention/Status -20 cm H2O 06/14/2014 12:00 PM   Chest Tube Air Leak No 06/14/2014 12:00 PM   Patency Intervention Other (Comment) 06/10/2014 11:30 AM   Drainage Description Yellow 06/14/2014 12:00 PM   Dressing Status Clean;Dry;Intact 06/14/2014 12:00 PM   Site Assessment Not assessed 06/13/2014   4:00 AM   CT Output (mL) 0 mL 06/14/2014  8:00 AM   Number of days:4     PICC Double Lumen 06/14/14 Left Basilic (Active)   Line necessity reviewed? Yes 06/15/2014  8:53 AM   Site  Assessment Clean;Dry;Intact 06/15/2014  8:53 AM   Proximal Lumen Status Infusing;Flushed;Blood return noted 06/15/2014  8:53 AM   Distal Lumen Status Flushed;Blood return noted 06/15/2014  8:53 AM   Length mark (cm) 47 cm 06/14/2014  4:59 PM   Dressing Type Transparent 06/15/2014  8:53 AM   Dressing Status Clean;Dry;Intact 06/15/2014  8:53 AM   Dressing Intervention Dressing changed 06/15/2014  7:00 PM   Biopatch Used? Yes 06/15/2014  7:00 PM   Dressing Change Due 06/22/14 06/15/2014  7:00 PM   Number of days:1                         Time spent for evaluation, management and coordination of care:   Bedelia Person, M.D.  06/15/2014  9:00 PM

## 2014-06-15 NOTE — Progress Notes (Signed)
Brief VIR Progress Note    Date Time: 06/15/2014 3:36 PM    Notes:   Scant amount of chest tube fluid overnight. Output is completely serous with no pus noted. Site c/d.     PLAN:   Case D/w Dr. Truett Perna. Chest tube d/c'd without incidence.     Physical Exam:   BP: 139/66, Pulse: 97, RR: 18 SpO2: 94%, Temp: 98.4 F (36.9 C) (Temporal Artery)   Intake and Output Summary (Last 24 hours) at Date Time    Intake/Output Summary (Last 24 hours) at 06/15/14 1536  Last data filed at 06/15/14 1334   Gross per 24 hour   Intake    888 ml   Output    650 ml   Net    238 ml     Drain Output:  10cc    Physical Exam otherwise unchanged.    Medications:       Scheduled Meds: PRN Meds:      albuterol-ipratropium 3 mL Nebulization Q4H WA   cefTRIAXone 2 g Intravenous Q24H SCH   chlordiazePOXIDE 5 mg Oral QHS   clindamycin 900 mg Intravenous Q8H SCH   famotidine 20 mg Oral Daily   folic acid 1 mg Oral Daily   heparin (porcine) 5,000 Units Subcutaneous Q12H [facility]   lactobacillus/streptococcus 1 capsule Oral Daily   QUEtiapine 25 mg Oral QHS   sodium chloride (PF) 10 mL Intracatheter Daily   thiamine 100 mg Oral Daily       Continuous Infusions:  . sodium chloride 50 mL/hr at 06/14/14 2000      influenza 0.5 mL Prior to discharge   LORazepam 2 mg Q1H PRN         LABS:     Recent Labs  Lab 06/15/14  0630 06/15/14  0515 06/14/14  0455   WBC 12.60* 13.32* 12.47*   HEMOGLOBIN 8.4* 8.3* 8.0*   HEMATOCRIT 25.2* 25.1* 23.6*       Recent Labs  Lab 06/15/14  0630 06/14/14  0455 06/13/14  0338   BUN 15.4 14.2 20.3   CREATININE 0.7 0.7 0.8   SODIUM 138 137 141   CHLORIDE 103 104 108   CO2 27 25 21*   GLUCOSE 95 98 119*       Recent Labs  Lab 06/09/14  1308   PT 15.9*   PT INR 1.3   PTT 34           RADIOLOGY:     Radiology Results (24 Hour)     Procedure Component Value Units Date/Time    Chest Tube Removal [[id number]] Resulted:  06/15/14 1532    Order Status:  Sent Updated:  06/15/14 1532    X-ray chest AP portable [[id number]] Collected:   06/15/14 0822    Order Status:  Completed Updated:  06/15/14 0830    Narrative:      HISTORY: Follow-up pneumonia in this patient admitted with sepsis and  empyema.    COMPARISON: 06/14/2014 and additional exams dating to chest CT of  06/09/2014.    FINDINGS: Single portable view of the chest was obtained. Cardiac size  is unchanged. A left PICC terminates in the region of the mid superior  vena cava. A pleural drainage catheter remains in the left lower lung.  Patchy multifocal bibasilar and lingular airspace opacities persist.  Trace effusions are suspected. There is no pneumothorax.       Impression:         1. No significant change in  lines and tubes.  2. Unchanged multifocal pneumonia.  3. Negative for pneumothorax.    Elizebeth Koller, MD   06/15/2014 8:26 AM      XR Chest AP Portable [[id number]] Collected:  06/14/14 1730    Order Status:  Completed Updated:  06/14/14 1736    Narrative:      Indication: Status post PICC insertion.    Procedure: Portable AP view of the chest.    Comparison: November 3.    Findings: There is a new left upper extremity PICC with tip in the  superior vena cava. A pigtail drain previously seen in the superior left  chest has been removed. Left basilar pigtail pleural drain unchanged.  Unchanged predominantly inferior lung opacities. No visible  pneumothorax.      Impression:      Impression:  1. New left upper extremity PICC with tip in the superior vena cava.  2. One of the left-sided chest tubes has been removed.    Wynema Birch, MD   06/14/2014 5:32 PM      PICC Line Placement [[id number]] Resulted:  06/14/14 1633    Order Status:  Sent Updated:  06/14/14 1706          Signed by: Chrystie Nose, MD                                                                [place] [facility]    [facility]  [facility]- Division of Vascular & Interventional Radiology    Office- [phone]  [place] at [facility] [phone]    [phone] working  hours)  [facility]--[phone] [facility]--[phone]

## 2014-06-15 NOTE — Progress Notes (Signed)
Pt's son called the Case Manager and told the Case Manager to see if any [place] facility will accept the patient for rehab as it is closer to his sister's house.    Case Manager called patient's son back at [phone] and explained him that [facility] and [facility], both, facilities are evaluating pt's clinical information and CM will let him know if any facility has a bed available and be able to accept the patient.      Now preferences for facilities are:    [facility], [employer], [address], [place] and [place].   CM will follow up as needed.

## 2014-06-15 NOTE — Progress Notes (Signed)
Patient arrived to room 248 from ICU. Patient drowsy. Daughter at bedside. Call light in reach. Bed alarm on.

## 2014-06-15 NOTE — Progress Notes (Signed)
Infectious Disease            Progress Note    06/15/2014   Alyjah Lovingood VWU:[id number],FAO:[id number] is a 80 y.o. male with past medical history significant for hyperlipidemia, who is admitted for bilateral pneumonia, sepsis and empyema.     Subjective:     Brian Trevino today Symptoms:  Stable. Looks very weak and lethargic. Transferred out of ICU. Feeling better. Cultures growing strep intermedius. Afebrile. No diarrhea, abdominal pain. Other review of system is non contributory.    Objective:     Blood pressure 138/65, pulse 100, temperature 98.6 F (37 C), temperature source Temporal Artery, resp. rate 24, height 1.803 m (5\' 11" ), weight 91.6 kg (201 lb 15.1 oz), SpO2 93 %.    General Appearance: Comfortable; chest tube is in place  HEENT: Pallor negative, Anicteric.     Lungs: Decreased breath sounds. Bilateral scattered crackles, chest tube in place  Heart:  S1 and S2.   Chest Wall: Symmetric chest wall expansion.   Abdomen: Obese, bowel sounds are positive.  Neurological:  Very weak lethargic and groggy; moves all extremities    Laboratory And Diagnostic Studies:     Recent Labs      06/15/14   0630  06/15/14   0515  06/14/14   0455   WBC  12.60*  13.32*  12.47*   HEMOGLOBIN  8.4*  8.3*  8.0*   HEMATOCRIT  25.2*  25.1*  23.6*   PLATELETS  392  426*  428*   NEUTROPHILS  86   --   80     Recent Labs      06/15/14   0630  06/14/14   0455   SODIUM  138  137   POTASSIUM  4.0  3.5   CHLORIDE  103  104   CO2  27  25   BUN  15.4  14.2   CREATININE  0.7  0.7   GLUCOSE  95  98   CALCIUM  7.9  7.6*     Recent Labs      06/13/14   0338   AST (SGOT)  50*   ALT  20   ALKALINE PHOSPHATASE  89   PROTEIN, TOTAL  4.7*   ALBUMIN  1.0*   BILIRUBIN, TOTAL  0.8       Current Med's:     Current Facility-Administered Medications   Medication Dose Route Frequency   . albuterol-ipratropium  3 mL Nebulization Q4H [facility]   . cefTRIAXone  2 g Intravenous Q24H SCH   . chlordiazePOXIDE  5 mg Oral QHS   . clindamycin  900 mg  Intravenous Q8H SCH   . famotidine  20 mg Oral Daily   . folic acid  1 mg Oral Daily   . heparin (porcine)  5,000 Units Subcutaneous Q12H [facility]   . lactobacillus/streptococcus  1 capsule Oral Daily   . QUEtiapine  25 mg Oral QHS   . sodium chloride (PF)  10 mL Intracatheter Daily   . thiamine  100 mg Oral Daily         Assessment:      Condition.  Improving.     Systemic inflammatory response syndrome, sepsis.   Strep intermedius infection   Bilateral pneumonia.   Respiratory failure   Empyema: status post chest tube placements   Hyperlipidemia.    Plan:      Continue  rocephin   Continue  clindamycin   Continue supportive care   Interventional radiology  followup   Discussed with patient's daughter    Discussed with patient daughter            Alfonzo Beers, M.D.,FACP  06/15/2014  9:06 AM

## 2014-06-15 NOTE — Plan of Care (Signed)
Problem: Safety  Goal: Patient will be free from injury during hospitalization  Outcome: Progressing  Safe environment maintained throughout shift. Hourly rounding completed per protocol. Call light in reach.    Problem: Pain  Goal: Patient's pain/discomfort is manageable  Outcome: Progressing  Denies pain.    Problem: Moderate/High Fall Risk Score >/=15  Goal: Patient will remain free of falls  Outcome: Progressing  Bed alarm on.     Problem: Inadequate Gas Exchange  Goal: Adequately oxygenating and ventilation is improved  Outcome: Progressing  O2 sats 94 throughout shift. Chest tube in place.

## 2014-06-15 NOTE — Progress Notes (Signed)
Chest tube removed by Dr. George Hugh. Tolerated well. O2 saturation initially 89%. HOB elevated and cough/deep breath with increase in sat to 92%.

## 2014-06-16 ENCOUNTER — Inpatient Hospital Stay: Payer: Medicare Other

## 2014-06-16 LAB — CELL MORPHOLOGY
Cell Morphology: ABNORMAL — AB
Platelet Estimate: NORMAL

## 2014-06-16 LAB — MAN DIFF ONLY
Band Neutrophils Absolute: 1.24 10*3/uL — ABNORMAL HIGH (ref 0.00–1.00)
Band Neutrophils: 13 %
Basophils Absolute Manual: 0 10*3/uL (ref 0.00–0.20)
Basophils Manual: 0 %
Eosinophils Absolute Manual: 0.19 10*3/uL (ref 0.00–0.70)
Eosinophils Manual: 2 %
Lymphocytes Absolute Manual: 0.57 10*3/uL (ref 0.50–4.40)
Lymphocytes Manual: 6 %
Metamyelocytes Absolute: 0.1 10*3/uL — ABNORMAL HIGH
Metamyelocytes: 1 %
Monocytes Absolute: 0.57 10*3/uL (ref 0.00–1.20)
Monocytes Manual: 6 %
Myelocytes Absolute: 0.1 10*3/uL — ABNORMAL HIGH
Myelocytes: 1 %
Neutrophils Absolute Manual: 6.75 10*3/uL (ref 1.80–8.10)
Nucleated RBC: 0 /100 WBC (ref 0–1)
Segmented Neutrophils: 71 %

## 2014-06-16 LAB — CBC AND DIFFERENTIAL
Hematocrit: 32 % — ABNORMAL LOW (ref 42.0–52.0)
Hgb: 10.6 g/dL — ABNORMAL LOW (ref 13.0–17.0)
MCH: 27.7 pg — ABNORMAL LOW (ref 28.0–32.0)
MCHC: 33.1 g/dL (ref 32.0–36.0)
MCV: 83.6 fL (ref 80.0–100.0)
MPV: 9.1 fL — ABNORMAL LOW (ref 9.4–12.3)
Platelets: 307 10*3/uL (ref 140–400)
RBC: 3.83 10*6/uL — ABNORMAL LOW (ref 4.70–6.00)
RDW: 16 % — ABNORMAL HIGH (ref 12–15)
WBC: 9.51 10*3/uL (ref 3.50–10.80)

## 2014-06-16 LAB — MAGNESIUM: Magnesium: 1.9 mg/dL (ref 1.6–2.6)

## 2014-06-16 LAB — BASIC METABOLIC PANEL
Anion Gap: 10 (ref 5.0–15.0)
BUN: 16 mg/dL (ref 9.0–28.0)
CO2: 24 mEq/L (ref 22–29)
Calcium: 7.8 mg/dL — ABNORMAL LOW (ref 7.9–10.2)
Chloride: 102 mEq/L (ref 100–111)
Creatinine: 0.7 mg/dL (ref 0.7–1.3)
Glucose: 100 mg/dL (ref 70–100)
Potassium: 3.8 mEq/L (ref 3.5–5.1)
Sodium: 136 mEq/L (ref 136–145)

## 2014-06-16 LAB — CHLAMYDIA ANTIBODIES
Chlamydia trachomatis, IgG: <1:64 {titer}
Chlamydia trachomatis, IgM: <1:10 {titer}
Chlamydophila pneumoniae, IgG: 1:128 {titer} — ABNORMAL HIGH
Chlamydophila pneumoniae, IgM: <1:10 {titer}
Chlamydophila psittaci , IgG: <1:64 {titer}
Chlamydophila psittaci, IgM: <1:10 {titer}

## 2014-06-16 LAB — GFR: EGFR: >60

## 2014-06-16 MED ORDER — QUETIAPINE FUMARATE 25 MG PO TABS
6.2500 mg | ORAL_TABLET | Freq: Every evening | ORAL | Status: DC
Start: 2014-06-16 — End: 2014-06-17
  Administered 2014-06-16: 6.25 mg via ORAL
  Filled 2014-06-16: qty 1

## 2014-06-16 NOTE — Progress Notes (Signed)
Assessment completed. POC and Safety Plan reviewed with pt's daughter. Pt denies pain. No distress noted. Call bell within reach. Will monitor.

## 2014-06-16 NOTE — SLP Plan of Care Note (Signed)
Speech Language Pathology Cancellation Note    Patient: Brian Trevino  NWG:[id number]    Unit: M578/[id number]    Patient not seen for speech language pathology therapy secondary to receiving breathing treatment with respiratory therapy. RN notified. Will continue to follow.  Truett Mainland M.S. CCC-SLP   Pager id [phone]

## 2014-06-16 NOTE — Progress Notes (Signed)
[facility] Hospitalist Daily Progress Note        Date Time: 06/16/2014  12:29 PM  Patient Name:Brian Trevino  VWU:[id number]  PCP: Patsy Lager, MD  Attending Physician:Shawnta Schlegel Levin Erp M.D.      CHIEF COMPLAINT      Chief Complaint   Patient presents with   . Shortness of Breath       SUBJECTIVE     [name] today:  Daughter at bedside   seems coherent   denies chest pain or sobchest tube taken out yesterday    Assessment/Plan     Active Diagnosis: Active Problems:    Respiratory failure    Pneumonia    Streptococcus pneumoniae    Empyema lung    Delirium        . Resp failure at admission improved    . Resp. Failure 2 o  Bilateral multifocal Community acquired pneumonia by Streptococcus intermidius complicated by empyema. No fevers. Clinically improving. On Rocephin & clinda now. On duoneb, acapella device. On Risaquad probiotic    . Left sided Empyema 2o above with pleural fluid cultures strep. Intermedius pneumonia. Blood cultures showed NO growth. Id on board. Abx changed to rocephin & clindamycin. Treated with chest tube. Recived initial chest tube on 10/29 and then followed by Tpa administartion in to the tube for breaking loculations and placement of another chest tube for better drainage. The anterior chest tube removed 06/14/14. The posterior one  removed 06/15/14. Received picc line 06/14/14. Discussed with id, dr. Maeola Sarah. Ok for discharge and needs 3 wks of iv abx , only rocephin further. Clinda will be discontinued at discharge    . Improved leucocytosis. No fvers, monitor    . Anemai . Monitoe. Clinically stable. No obvious signs of bleed.    . Delirium especially while he was in Icu, multifactorial 2o sepsis,  Advanced age, Not sure if any underlying alcohol withdrawal. Needed use of precedex in icu. Librium d/ced. Seoquel being tapered. Has some sun downing.. Mild Acute renal failure at admission 2osepsis and dehydration, resolved     .  Incidental finding of possible sella mass? In head ct. Will need to follow up as outpt . (Slight expansion of the sella turcica with soft tissue within it.Findings could indicate a sella turcica mass. This if clinically indicated could be better evaluated with an MRI. )     . GI Prophylaxis: pepcid  . VTE Prophylaxis: heparin      Code Status: full code        -----------------------------------  Hpi Details :  Brian Trevino is a 80 y.o. male history of hyperlipidemia visiting from [place] who presents to the hospital with respiratory failure. Patient arrived via plane 2 days prior to admission. He had worsening shortness of breath approximately one month prior with increasing productive sputum. He states that he went to his primary care physician who prescribed codeine. He had not been exposed to antibiotics these past few months. Patient noted fever and chills. Family noted patient was confused with increasing sputum production. When patient's told family. He was unable to brief. They called 911. She was noted to be in respiratory distress and hypoxic. Chest CT scan indicated large lung abscess. Patient underwent chest tube placement by interventional radiology with good success. Approximately of pus came from chest tube. Patient state that he felt better after chest tube placement. Improvement in shortness of breath. He has mild right diaphragmatic pain from cough.  Hosp. Course:  Found to have pneumoinia with concern fr abcess Vs empyema. Evaluated by Ir and chest tube placed. Pleural fluid grew streptocococcus intermedius. Blood cultures negative. Initially tretaed with vanco, zosyn & levaquin. Later changed abx to rocephin 7 clinda based on sensitivities. Id - dr. Ralene Cork. Patient has delirium in icu requiring use of precededx drip. Now resolved. Chest tubes finally removed as drainage improved. Received picc line. Per Id, to continue only rocephin at discharge until 3 more weeks for empyema. Until  07/07/14.      ---------------------          DVT Prohylaxis:heparin  Code Status: Full Code   Disposition: rehab  Prognosis:fair at this point  Type of Admission:Inpatient  Estimated Length of Stay (including stay in the ER receiving treatment): 1-2 days  Medical Necessity for stay:pneumonia, empyema, chest tubes        OBJECTIVE:     Blood pressure 121/60, pulse 94, temperature 99.2 F (37.3 C), temperature source Temporal Artery, resp. rate 16, height 1.803 m (5\' 11" ), weight 91.6 kg (201 lb 15.1 oz), SpO2 97 %.      Coherent, alert aake, oriented, no apparent distress, comfortale, slight tremor at rest, slight increased tone with slight cogwheel?, oral mucosa moist, no jvd, regular heart sounds, dedcreased breath sounds on left chest, no apparent wheeze, soft nontender abdomene, bs heard,  Noedema, warm peripheries    General Appearance: Comfortable, well-appearing and in no acute respiratory distress.    HEENT: AT/ NC, oral mucosa is moist with no lesions.  Pupils are equal, round, and reactive to light.   Lungs:  Normal respiratory rate and normal effort.    Breath sounds clear to auscultation in all lung fields.  No wheezes, rales, rhonchi or decreased breath sounds.    Heart: Normal rate.  S1 normal and S2 normal. No Murmurs, Rubs or gallops audible  Chest: Symmetric chest wall expansion.  Abdomen: Abdomen is soft, scaphoid and non-distended.  There are no signs of ascites. Bowel sounds are normal.  There is no abdominal tenderness in all four quadrants and epigastrium.  There is no palpable mass.   Neurological: Patient is alert and oriented to person, place and time.  Normal motor strength in all extremities.    Extremities: No pretibial edema, dorsalis pedis pulses palpable  Skin:  Warm and dry.  No rash or ecchymosis.    DIAGNOSTIC LAB STUDY     Recent Labs      06/16/14   0521  06/15/14   0630   WBC  9.51  12.60*   HEMOGLOBIN  10.6*  8.4*   HEMATOCRIT  32.0*  25.2*   PLATELETS  307  392   MCV  83.6   83.2     Recent Labs      06/16/14   0521  06/15/14   0630   SODIUM  136  138   POTASSIUM  3.8  4.0   CHLORIDE  102  103   CO2  24  27   BUN  16.0  15.4   CREATININE  0.7  0.7   GLUCOSE  100  95   CALCIUM  7.8*  7.9   MAGNESIUM  1.9  1.7     No results for input(s): AST, ALT, ALKPHOS, PROT, ALB in the last 72 hours.    DIAGNOSTIC IMAGING STUDIES   Radiological Procedure reviewed.  Radiology Results (24 Hour)     Procedure Component Value Units Date/Time    X-ray chest  AP portable [[id number]] Collected:  06/16/14 0854    Order Status:  Completed Updated:  06/16/14 0900    Narrative:      HISTORY: Follow-up lung opacities      COMPARISON: Chest radiograph from one day prior.    Portable chest radiograph. There has been interval removal of the left  basilar chest tube. Left PICC terminates in the middle third of the  superior vena cava. There is no pneumothorax. There are stable trace  right and small left pleural effusions. Extensive patchy bibasilar  consolidation is not appreciably changed.      Impression:           1. Stable extensive patchy bibasilar consolidation, suspicious for  multifocal pneumonia.    2. Stable trace right and small left pleural effusions.      Cleone Slim, MD   06/16/2014 8:56 AM      PICC Line Placement [[id number]] Collected:  06/15/14 1652    Order Status:  Completed Updated:  06/15/14 1657    Narrative:      HISTORY:  Requires IV access for antibiotics.    PICC line insertion at the bedside in the IR Recovery:    PROCEDURE: The nature of the procedure, risks, benefits, and  alternatives were discussed with patient's daughter. All questions were  answered and consent was obtained.    This procedure was performed by Berline Lopes, RT under my supervision.    Ultrasound was used to confirm patency of the superficial vein prior to  puncture.  Using standard sterile technique and 1% lidocaine anesthesia,  puncture of the vein was performed with a 21 gauge single wall needle  under direct  sonographic guidance.  Sonographic images were recorded  pre- and post- puncture for documentation.   A 0.018 inch guidewire was  advanced into the vessel. The needle was removed and a peel-away sheath  was placed. Anatomic landmarks were used to estimate appropriate  catheter length. The catheter was then trimmed and was advanced through  the peel-away sheath. The sheath was removed. Excellent blood return and  fluid infusion was confirmed.  The lumen was flushed and capped. The  catheter was secured with an adhesive device and a sterile dressing was  applied.       Impression:       Successful placement of a 5 French double lumen PICC  catheter via left basilic venous approach at bedside in recovery under  ultrasound guidance. 47 cm catheter length was used. A chest x-ray will  be obtained for tip localization.    Lemar Livings, MD   06/15/2014 4:53 PM      Chest Tube Removal [[id number]] Resulted:  06/15/14 1532    Order Status:  Sent Updated:  06/15/14 1537          CURRENT MEDICATIONS     Current Facility-Administered Medications   Medication Dose Route Frequency   . albuterol-ipratropium  3 mL Nebulization Q4H [facility]   . cefTRIAXone  2 g Intravenous Q24H [facility]   . clindamycin  900 mg Intravenous Q8H [facility]   . famotidine  20 mg Oral Daily   . folic acid  1 mg Oral Daily   . heparin (porcine)  5,000 Units Subcutaneous Q12H [facility]   . lactobacillus/streptococcus  1 capsule Oral Daily   . QUEtiapine  12.5 mg Oral QHS   . sodium chloride (PF)  10 mL Intracatheter Daily   . thiamine  100 mg Oral Daily  LINES, DRAINS, AIRWAY     Chest Tube 2 Left Pleural 12 Fr. (Active)   Intervention/Status -20 cm H2O 06/15/2014  8:53 AM   Chest Tube Air Leak No 06/15/2014  8:53 AM   Patency Intervention Tip/tilt 06/09/2014  5:43 PM   Drainage Description Other (Comment) 06/15/2014  8:53 AM   Dressing Status Clean;Dry;Intact 06/15/2014  8:53 AM   Site Assessment Other (Comment) 06/15/2014  8:53 AM   CT Output (mL) 0 mL 06/15/2014  7:00 AM    Number of days:7       Urethral Catheter Non-latex 16 Fr. (Active)   Catheter necessity reviewed? Yes 06/16/2014  9:00 AM   Site Assessment Clean;Intact 06/16/2014  9:00 AM   Pericare (With foley) Yes 06/16/2014  9:00 AM   Urine Catheter Care Yes 06/16/2014  9:00 AM   Collection Container Standard drainage bag 06/16/2014  9:00 AM   Securement Method Leg strap 06/16/2014  9:00 AM   Reason for Continuing Urinary Catheterization past POD 1 Acute urinary retention due to medication 06/16/2014  9:00 AM   Positioned catheter tubing for unobstructed urine flow: Yes 06/15/2014  8:53 AM   Output (mL) 400 mL 06/16/2014  9:00 AM   Number of days:5       [REMOVED] Chest Tube 1 Left Pleural;Midaxillary 10 Fr. (Removed)   Removed 06/10/14 1126   Intervention/Status -20 cm H2O 06/10/2014  8:00 AM   Chest Tube Air Leak No 06/10/2014  8:00 AM   Patency Intervention Tip/tilt 06/09/2014  5:43 PM   Drainage Description Serosanguinous 06/10/2014  8:00 AM   Dressing Status Clean;Intact;Dry 06/10/2014  8:00 AM   Site Assessment Clean;Dry;Intact 06/10/2014  8:00 AM   CT Output (mL) 4 mL 06/10/2014  6:00 AM   Number of days:1       [REMOVED] Chest Tube 1 Left Midaxillary  (Removed)   Removed 06/14/14 1000   Intervention/Status -20 cm H2O 06/14/2014 12:00 PM   Chest Tube Air Leak No 06/14/2014 12:00 PM   Patency Intervention Other (Comment) 06/10/2014 11:30 AM   Drainage Description Yellow 06/14/2014 12:00 PM   Dressing Status Clean;Dry;Intact 06/14/2014 12:00 PM   Site Assessment Not assessed 06/13/2014  4:00 AM   CT Output (mL) 0 mL 06/14/2014  8:00 AM   Number of days:4     PICC Double Lumen 06/14/14 Left Basilic (Active)   Line necessity reviewed? Yes 06/16/2014 12:03 PM   Site Assessment Clean;Dry;Intact 06/16/2014 12:03 PM   Proximal Lumen Status Infusing;Flushed;Blood return noted 06/16/2014 12:03 PM   Distal Lumen Status Flushed;Blood return noted 06/16/2014 12:03 PM   Length mark (cm) 47 cm 06/14/2014  4:59 PM   Dressing Type Transparent 06/16/2014  12:03 PM   Dressing Status Clean;Dry;Intact 06/16/2014 12:03 PM   Dressing Intervention Dressing changed 06/15/2014  7:00 PM   Biopatch Used? Yes 06/16/2014 12:03 PM   End Caps Free From Blood Yes 06/16/2014 12:03 PM   Line Used For Blood Draw Yes 06/16/2014 12:03 PM   Dressing Change Due 06/22/14 06/16/2014 12:03 PM   Number of days:2                         Time spent for evaluation, management and coordination of care:   Bedelia Person, M.D.  06/16/2014  12:29 PM

## 2014-06-16 NOTE — Student Medication History (Signed)
Medication History Interview and Review                                              06/16/2014  10:23 AM    The Best Possible Medication History was completed for Brian Trevino.    Patient Allergy LIst has been Verified  Patient Medication List has been Updated    Corrections to Home Medications and Allergies      The following ALLERGY and INTOLERANCE clarifications were documented:  NKDA.    The following Medications were ADDED to the Home Medication List:  Areds 2 soft gels: 1 capsule PO BID.     The following Medications were REMOVED from the Home Medication List:              None.    The following Medications required DOSE, FREQUENCY, or FORMULATION changes:  None.  Medication Knowledge,  Barriers, and Comments     ADHERENCE AND BARRIERS TO HOME MEDICATION USE:  Unable to assess.     ADDITIONAL COMMENTS:  Patient is currently living in [place]. His daughter brought in medication bottles to confirm medications.     On a scale of 1-5 (5 being very knowledgeable and 1 being completely unaware), what was the patient/caregiver level of familiarity with their home medications?    X  Patient   X  Caregiver: Daighter       5 Very knowledgeable; Patient can list all home medications with doses, frequencies, and routes; can give indications.     4 Knowledgeable;  Patient can list all medications and most doses and frequencies; can give indications.     3 Somewhat knowledgeable; Patient can list most medications; may or may not know doses or indications.     2 Unaware;  Patient can list some medications or knows some indications.   X  1 Completely Unaware;  Patient can not list any medications or indications of medications.   For patients whose medication knowledge <4, please verify information with bottles, caregiver, pharmacy, physician, insurance, or another reliable source.      Information Sources  X  Patient Interview   X  Electronic PTA Medication List     Medication List   X  Medication Vials or  Bottles   X  Family or Caregiver Interview     Pharmacy:     Other, please note:       Allergies and Intolerances  Review of patient's allergies indicates no known allergies.      Prior to Administration Medications  Prescriptions prior to admission   Medication Sig Dispense Refill Last Dose   . aspirin 81 MG chewable tablet Chew 81 mg by mouth daily.   06/09/2014 at Unknown time   . atorvastatin (LIPITOR) 10 MG tablet Take 10 mg by mouth daily.   06/09/2014 at Unknown time   . fenofibrate (TRICOR) 54 MG tablet Take 54 mg by mouth daily.   06/09/2014 at Unknown time   . Multiple Vitamins-Minerals (PRESERVISION AREDS 2 PO) Take 1 capsule by mouth 2 (two) times daily.      . sulindac (CLINORIL) 200 MG tablet Take 200 mg by mouth 2 (two) times daily.   06/09/2014 at Unknown time         Current Hospital Administered Medications  Current Facility-Administered Medications   Medication Dose Route Frequency   .  albuterol-ipratropium  3 mL Nebulization Q4H [facility]   . cefTRIAXone  2 g Intravenous Q24H [facility]   . clindamycin  900 mg Intravenous Q8H [facility]   . famotidine  20 mg Oral Daily   . folic acid  1 mg Oral Daily   . heparin (porcine)  5,000 Units Subcutaneous Q12H [facility]   . lactobacillus/streptococcus  1 capsule Oral Daily   . QUEtiapine  12.5 mg Oral QHS   . sodium chloride (PF)  10 mL Intracatheter Daily   . thiamine  100 mg Oral Daily     PRN   Medication Dose Route   . influenza  0.5 mL Intramuscular   . LORazepam  2 mg Intravenous             Sedalia Muta [phone]  Student Pharmacist  [facility]

## 2014-06-16 NOTE — Plan of Care (Signed)
Problem: Safety  Goal: Patient will be free from injury during hospitalization  Outcome: Progressing  Call bell within reach  Hourly rounding per protocol  Pt verbalized understanding safety plan   Bed/ chair alarm     Problem: Pain  Goal: Patient's pain/discomfort is manageable  Outcome: Progressing  Pt denies pain. Will monitor and medicate if needed.

## 2014-06-16 NOTE — Progress Notes (Signed)
Case Manager met with patient as well as his daughter, Noreene Larsson, in the room and told her that [facility], [address] and [place], all, facilities may have a bed available on Friday.  MD has explained to the daughter that patient may be discharged to the Nursing facility for rehab services.      Patient's daughter prefers to go the [facility] for first choice for nursing facility.  Transport will be discussed again depending on the oxygen need in the am.  CM will be available as needed.

## 2014-06-16 NOTE — Plan of Care (Signed)
Problem: Safety  Goal: Patient will be free from injury during hospitalization  Outcome: Progressing  Safe environment maintained throughout shift. Hourly rounding completed per protocol. Call light in reach.     Problem: Pain  Goal: Patient's pain/discomfort is manageable  Outcome: Progressing  Denies pain.     Problem: Moderate/High Fall Risk Score >/=15  Goal: Patient will remain free of falls  Outcome: Progressing  Confused at night. Bed alarm on.    Problem: Inadequate Gas Exchange  Goal: Adequately oxygenating and ventilation is improved  Outcome: Progressing  Monitoring O2 sats throughout shift.

## 2014-06-16 NOTE — Progress Notes (Signed)
Nutritional Support Services  Nutrition Follow Up    Brian Trevino 80 y.o. male   MRN: [id number]    Nutrition Summary/Diet history:  Daughter requested to see dietitian due to decreased oral intake.    Nutrition Diagnosis:   Inadequate oral intake related to decreased apetite as evidenced by pt interview.     Intervention:  1. Reviewed food choices with patient and daughter.  2. Encourage small, frequent and calorically dense meals  3. Patient interested in trying a smoothie.  Food services called.     Goal: Consumes > 75% of meals prior to discharge from the hospital.    Monitoring:   Evaluation:   1. PO intake   Poor   2. Weights   stable    Nutrition risk level: Moderate    Assessment Data:  Adm dx:  <principal problem not specified>   Patient Active Problem List   Diagnosis   . Respiratory failure   . Pneumonia   . Streptococcus pneumoniae   . Empyema lung   . Delirium   PMH:  has a past medical history of Hyperlipemia.  PSH:  has past surgical history that includes Cataract extraction.    Recent Labs  Lab 06/16/14  0521 06/15/14  0630 06/14/14  0455 06/13/14  0338 06/13/14  0336 06/12/14  0335   SODIUM 136 138 137 141  --  138   POTASSIUM 3.8 4.0 3.5 3.2*  --  3.6   CHLORIDE 102 103 104 108  --  107   CO2 24 27 25  21*  --  19*   BUN 16.0 15.4 14.2 20.3  --  24.4   CREATININE 0.7 0.7 0.7 0.8  --  0.8   GLUCOSE 100 95 98 119*  --  86   CALCIUM 7.8* 7.9 7.6* 7.5*  --  7.5*   MAGNESIUM 1.9 1.7 1.7  --  2.0 1.8   PHOSPHORUS  --   --   --   --  3.5  --    EGFR >60.0 >60.0 >60.0 >60.0  --  >60.0   Pertinent meds: famotidine, folic acid, thiamine  Social history:  Visiting from [employer] Intolerance/Religious/Ethnic preferences:  None noted  Diet Order:  Orders Placed This Encounter   Procedures   . Diet regular   Food intake: Poor  GI symptoms: hypo BS  Hydration: WDL      Skin: chest tube  Anthropometrics  Height: 180.3 cm (5\' 11" )  Weight: 91.6 kg (201 lb 15.1 oz)  Weight Change: -1.01  IBW/kg (Calculated) Male:  78.21 kg  IBW/kg (Calculated) Male: 70.43 kg  BMI (calculated): 28.2  Wt Readings from Last 30 Encounters:   06/09/14 91.6 kg (201 lb 15.1 oz)   Learning Needs: no  No Known Allergies    Geryl Councilman, RD, CNSC  Spectralink - [phone]

## 2014-06-16 NOTE — Progress Notes (Signed)
Infectious Disease            Progress Note    06/16/2014   Brian Trevino VHQ:[id number],LKG:[id number] is a 80 y.o. male with past medical history significant for hyperlipidemia, who is admitted for bilateral pneumonia, sepsis and empyema.     Subjective:     Brian Trevino today Symptoms:  Stable. Status post chest tubes removed, more awake and responsive. Leukocytosis resolved.  Afebrile. Feeling better. Cultures growing strep intermedius. Afebrile. No diarrhea, abdominal pain. Other review of system is non contributory.    Objective:     Blood pressure 123/63, pulse 98, temperature 97.7 F (36.5 C), temperature source Temporal Artery, resp. rate 20, height 1.803 m (5\' 11" ), weight 91.6 kg (201 lb 15.1 oz), SpO2 100 %.    General Appearance: Comfortable  HEENT: Pallor negative, Anicteric.     Lungs: Decreased breath sounds.   Heart:  S1 and S2.   Chest Wall: Symmetric chest wall expansion.   Abdomen: Obese, bowel sounds are positive.  Neurological:  More comfortable and responsive today    Laboratory And Diagnostic Studies:     Recent Labs      06/16/14   0521  06/15/14   0630   WBC  9.51  12.60*   HEMOGLOBIN  10.6*  8.4*   HEMATOCRIT  32.0*  25.2*   PLATELETS  307  392   NEUTROPHILS  71  86     Recent Labs      06/16/14   0521  06/15/14   0630   SODIUM  136  138   POTASSIUM  3.8  4.0   CHLORIDE  102  103   CO2  24  27   BUN  16.0  15.4   CREATININE  0.7  0.7   GLUCOSE  100  95   CALCIUM  7.8*  7.9         Current Med's:     Current Facility-Administered Medications   Medication Dose Route Frequency   . albuterol-ipratropium  3 mL Nebulization Q4H [facility]   . cefTRIAXone  2 g Intravenous Q24H [facility]   . clindamycin  900 mg Intravenous Q8H [facility]   . famotidine  20 mg Oral Daily   . folic acid  1 mg Oral Daily   . heparin (porcine)  5,000 Units Subcutaneous Q12H [facility]   . lactobacillus/streptococcus  1 capsule Oral Daily   . QUEtiapine  12.5 mg Oral QHS   . sodium chloride (PF)  10 mL Intracatheter Daily   . thiamine   100 mg Oral Daily         Assessment:      Condition.  Improving.     Systemic inflammatory response syndrome, sepsis.   Strep intermedius infection   Bilateral pneumonia.   Respiratory failure   Empyema: status post chest tube removal   Hyperlipidemia.    Plan:      Continue  rocephin   Discontinue  clindamycin   Continue supportive care   Interventional radiology followup   Discussed with patient's daughter    Discussed with Dr. Truett Perna   Possible transfer to rehabilitation tomorrow            Alfonzo Beers, M.D.,FACP  06/16/2014  8:40 AM

## 2014-06-17 ENCOUNTER — Inpatient Hospital Stay: Payer: Medicare Other

## 2014-06-17 DIAGNOSIS — G9389 Other specified disorders of brain: Secondary | ICD-10-CM

## 2014-06-17 DIAGNOSIS — D72829 Elevated white blood cell count, unspecified: Secondary | ICD-10-CM

## 2014-06-17 LAB — MYCOPLASMA PNEUMONIAE AB (IGG,IGM),EIA
M. pneumoniae Ab IgG, EIA: 1.33 — ABNORMAL HIGH (ref <=0.90)
M. pneumoniae Ab IgM, EIA: 72 (ref <770)

## 2014-06-17 MED ORDER — RISAQUAD PO CAPS
1.0000 | ORAL_CAPSULE | Freq: Every day | ORAL | Status: DC
Start: 2014-06-17 — End: 2016-06-10

## 2014-06-17 MED ORDER — CEFTRIAXONE SODIUM 2 G IJ SOLR
2.0000 g | Freq: Every day | INTRAMUSCULAR | Status: AC
Start: 2014-06-17 — End: 2014-07-08

## 2014-06-17 NOTE — Progress Notes (Incomplete)
Case Management Facility [place] Checklist    Name of Receiving Facility and Bed #:   [facility], room number is 111     Number for Floor RN to Call Report:  [phone]     Name and number of discharge nurse and time nurse notified: Nurse, Herbert Seta [phone], at 09:30 am   Family Member notified of transfer plan:  pt's son at bedside [phone])     Fax number of Receiving Facility: [phone]   Hard Copy of any narcotic prescriptions in envelope to travel with the patient:    Yes.  Nurse to add in the envelope   Hard Copy of Durable DNR and/or advanced directive in envelope to travel with the patient:    N/a  full   Phone number and name of attending physician at receiving facility (MD to MD handoff): Dr. Tressie Ellis [phone]           Transportation  Mode and Time of Transportation:    Ambulance PTS and pick up time is 2:00 pm.    Patient's is aware of the facility, room and phone number to go to the facility        SNF Only  I have completed the Medicaid Pre- Screening ([place], DMAS 96 & 97) and faxed it via ECIN/[employer] to the receiving facility.    yes   I have completed the DMAS 95 - [place]/MR form and faxed it via ECIN/Allscripts to the receiving facility.  yes     Does the DMAS 95 - [place]/MR trigger a Level 2 screening?    no     Medicare Only  I have validated that this patient has a 3 midnight inpatient qualifying stay (SNF only). yes   I have validated that the patient has received the second Important Message from Medicare (IMM) letter.    yes     The following was routed via Epic to the Receiving Facility:  Document Routed via Epic on Day of Discharge?   History & Physical yes   Discharge Medication Reconciliation (.DCMEDLIST in note) yes   D/C order yes   Last day of PT/OT notes (if applicable) yes   D/C Summary yes   PICC Line Report (if applicable) Yes---patient will go with the PICC line to the [facility] facility   Most Recent Chest Xray yes   All MD Consults yes   Isolation Requirement  (go to isolation order in other orders section of chart review) No isolation in the hospital   Urine/Wound/Blood Culture Results  yes   Discharge Instructions from AVS (use Pasteboard option to copy & paste from AVS into a progress note) yes   Tube Feeding or TPN Order (rate & formula) if applicable n/a   Last 3 days of MD Progress Notes yes

## 2014-06-17 NOTE — Plan of Care (Signed)
Problem: Safety  Goal: Patient will be free from injury during hospitalization  Outcome: Progressing  Intervention: Assess patient's risk for falls and implement fall prevention plan of care per policy  Patient is a high risk  for falls.  Fall prevention plan in place.  Call bell, bedside table, personal belongings within reach.   Communication board updated with current shift nurse/tech/charge nurse extensions.  Hourly rounding to assess for needs and ensure safety.         Intervention: Assess for patient's risk for elopement and implement Elopement Risk plan per policy  No risk for elopement      Problem: Moderate/High Fall Risk Score >/=15  Goal: Patient will remain free of falls  Outcome: Progressing

## 2014-06-17 NOTE — Progress Notes (Addendum)
[facility] Hospitalist Daily Progress Note        Date Time: 06/17/2014  9:22 AM  Patient Name:Brian Trevino Media  ZOX:[id number]  PCP: Patsy Lager, MD  Attending Physician:Takelia Urieta Levin Erp M.D.      CHIEF COMPLAINT      Chief Complaint   Patient presents with   . Shortness of Breath       SUBJECTIVE     [name] today:  Updated son at bedside at lenghth . All questions are answered . Patient alseep arousable. Denies complaints , coherenrt, ate breakfast. No apin, breathing getting betetr. No chest pain.  Assessment/Plan     Active Diagnosis: Active Problems:    Respiratory failure    Pneumonia    Streptococcus pneumoniae    Empyema lung    Delirium        . Resp failure at admission improved    . Resp. Failure 2 o  Bilateral multifocal Community acquired pneumonia by Streptococcus intermidius complicated by empyema. No fevers. Clinically improving. On Rocephin & clinda now. On duoneb, acapella device. On Risaquad probiotic    . Left sided Empyema 2o above with pleural fluid cultures strep. Intermedius pneumonia. Blood cultures showed NO growth. Id on board. Abx changed to rocephin & clindamycin. Treated with chest tube. Recived initial chest tube on 10/29 and then followed by Tpa administartion in to the tube for breaking loculations and placement of another chest tube for better drainage. The anterior chest tube removed 06/14/14. The posterior one  removed 06/15/14. Received picc line 06/14/14. Discussed with id, dr. Maeola Sarah. Ok for discharge and needs 3 wks of iv abx , only rocephin further. Clinda will be discontinued at discharge. Discuseed with id. Ok for discharge    . Improved leucocytosis. No fvers, monitor    . Anemai . Monitoe. Clinically stable. No obvious signs of bleed.    . Delirium especially while he was in [facility], multifactorial 2o sepsis,  Advanced age, Not sure if any underlying alcohol withdrawal. Needed use of precedex in icu. Librium d/ced. Seoquel  being tapered. Has some sun downing.. Mild Acute renal failure at admission 2osepsis and dehydration, resolved. Delirium improved.     . D/c foley. Urine void trial     . Incidental finding of possible sella mass? In head ct. Will need to follow up as outpt . (Slight expansion of the sella turcica with soft tissue within it.Findings could indicate a sella turcica mass. This if clinically indicated could be better evaluated with an MRI. ). Updated patient & family at bedside and they voiced understanding the importance of folow up given the fact it could be benign vspotentially malignant     . GI Prophylaxis: pepcid  . VTE Prophylaxis: heparin      Code Status: full code        -----------------------------------  Hpi Details :  Brian Trevino is a 80 y.o. male history of hyperlipidemia visiting from [place] who presents to the hospital with respiratory failure. Patient arrived via plane 2 days prior to admission. He had worsening shortness of breath approximately one month prior with increasing productive sputum. He states that he went to his primary care physician who prescribed codeine. He had not been exposed to antibiotics these past few months. Patient noted fever and chills. Family noted patient was confused with increasing sputum production. When patient's told family. He was unable to brief. They called 911. She was noted to be in respiratory distress and hypoxic. Chest CT scan indicated  large lung abscess. Patient underwent chest tube placement by interventional radiology with good success. Approximately of pus came from chest tube. Patient state that he felt better after chest tube placement. Improvement in shortness of breath. He has mild right diaphragmatic pain from cough.       Hosp. Course:  Found to have pneumoinia with concern fr abcess Vs empyema. Evaluated by Ir and chest tube placed. Pleural fluid grew streptocococcus intermedius. Blood cultures negative. Initially tretaed with vanco, zosyn  & levaquin. Later changed abx to rocephin 7 clinda based on sensitivities. Id - dr. Ralene Cork. Patient has delirium in icu requiring use of precededx drip. Now resolved. Chest tubes finally removed as drainage improved. Received picc line. Per Id, to continue only rocephin at discharge until 3 more weeks for empyema. Until 07/07/14.      ---------------------    Discharge instructions:  . Continue rocephin for 3 weeks more  . Activity as tolertaed  . F/u infectous disease doctor in 1 wk  . F/u pulmonologist in 1wk  . F/u family doc in 1 wk  . You have abnormal ct of the head with finding of mass in the sella region. This needs to be evaluated outpatient with an mri on a nonemergent basis  .        DVT Prohylaxis:heparin  Code Status: Full Code   Disposition: rehab  Prognosis:fair at this point  Type of Admission:Inpatient  Estimated Length of Stay (including stay in the ER receiving treatment): 1-2 days  Medical Necessity for stay:pneumonia, empyema, chest tubes        OBJECTIVE:     Blood pressure 123/58, pulse 96, temperature 98.1 F (36.7 C), temperature source Temporal Artery, resp. rate 26, height 1.803 m (5\' 11" ), weight 91.6 kg (201 lb 15.1 oz), SpO2 95 %.      Coherent, alert aake, oriented, no apparent distress, comfortale, slight tremor at rest, slight increased tone with slight cogwheel?, oral mucosa moist, no jvd, regular heart sounds, dedcreased breath sounds on left chest, no apparent wheeze, soft nontender abdomene, bs heard,  Noedema, warm peripheries    General Appearance: Comfortable, well-appearing and in no acute respiratory distress.    HEENT: AT/ NC, oral mucosa is moist with no lesions.  Pupils are equal, round, and reactive to light.   Lungs:  Normal respiratory rate and normal effort.    Breath sounds clear to auscultation in all lung fields.  No wheezes, rales, rhonchi or decreased breath sounds.    Heart: Normal rate.  S1 normal and S2 normal. No Murmurs, Rubs or gallops audible  Chest:  Symmetric chest wall expansion.  Abdomen: Abdomen is soft, scaphoid and non-distended.  There are no signs of ascites. Bowel sounds are normal.  There is no abdominal tenderness in all four quadrants and epigastrium.  There is no palpable mass.   Neurological: Patient is alert and oriented to person, place and time.  Normal motor strength in all extremities.    Extremities: No pretibial edema, dorsalis pedis pulses palpable  Skin:  Warm and dry.  No rash or ecchymosis.    DIAGNOSTIC LAB STUDY     Recent Labs      06/16/14   0521  06/15/14   0630   WBC  9.51  12.60*   HEMOGLOBIN  10.6*  8.4*   HEMATOCRIT  32.0*  25.2*   PLATELETS  307  392   MCV  83.6  83.2     Recent Labs  06/16/14   0521  06/15/14   0630   SODIUM  136  138   POTASSIUM  3.8  4.0   CHLORIDE  102  103   CO2  24  27   BUN  16.0  15.4   CREATININE  0.7  0.7   GLUCOSE  100  95   CALCIUM  7.8*  7.9   MAGNESIUM  1.9  1.7     No results for input(s): AST, ALT, ALKPHOS, PROT, ALB in the last 72 hours.    DIAGNOSTIC IMAGING STUDIES   Radiological Procedure reviewed.  Radiology Results (24 Hour)     Procedure Component Value Units Date/Time    X-ray chest AP portable [[id number]] Resulted:  06/17/14 0819    Order Status:  Sent Updated:  06/17/14 0819    XR Chest AP Portable [[id number]] Collected:  06/17/14 0723    Order Status:  Completed Updated:  06/17/14 0729    Narrative:      TECHNIQUE:  AP chest    INDICATION: Pneumonia.    COMPARISON: Chest radiograph dated one day prior.    FINDINGS:     Left PICC with tip projecting at the SVC. Multiple leads project over  the chest.    Patchy opacities in the mid and lower portions of both lungs, likely  pneumonia, unchanged. Cannot exclude small bilateral pleural effusions.  No edema. No pneumothorax. Cardiopericardial contour is partially  obscured by adjacent opacity, thus cannot adequately assess for  enlargement or interval change.       Impression:        Patchy opacities in the mid and lower portions of  both lungs, likely  pneumonia, unchanged.     Cannot exclude small bilateral pleural effusions.     Johnsie Kindred, MD   06/17/2014 7:25 AM            CURRENT MEDICATIONS     Current Facility-Administered Medications   Medication Dose Route Frequency   . albuterol-ipratropium  3 mL Nebulization Q4H WA   . cefTRIAXone  2 g Intravenous Q24H SCH   . famotidine  20 mg Oral Daily   . folic acid  1 mg Oral Daily   . heparin (porcine)  5,000 Units Subcutaneous Q12H [facility]   . lactobacillus/streptococcus  1 capsule Oral Daily   . QUEtiapine  6.25 mg Oral QHS   . sodium chloride (PF)  10 mL Intracatheter Daily   . thiamine  100 mg Oral Daily       LINES, DRAINS, AIRWAY     Chest Tube 2 Left Pleural 12 Fr. (Active)   Intervention/Status -20 cm H2O 06/15/2014  8:53 AM   Chest Tube Air Leak No 06/15/2014  8:53 AM   Patency Intervention Tip/tilt 06/09/2014  5:43 PM   Drainage Description Other (Comment) 06/15/2014  8:53 AM   Dressing Status Clean;Dry;Intact 06/15/2014  8:53 AM   Site Assessment Other (Comment) 06/15/2014  8:53 AM   CT Output (mL) 0 mL 06/15/2014  7:00 AM   Number of days:8       Urethral Catheter Non-latex 16 Fr. (Active)   Catheter necessity reviewed? Yes 06/16/2014 10:05 PM   Site Assessment Clean;Intact 06/16/2014 10:05 PM   Pericare (With foley) Yes 06/16/2014 10:05 PM   Urine Catheter Care Yes 06/16/2014 10:05 PM   Collection Container Standard drainage bag 06/16/2014 10:05 PM   Securement Method Leg strap 06/16/2014 10:05 PM   Reason for Continuing Urinary Catheterization past POD 1 Acute  urinary retention due to medication 06/16/2014 11:00 PM   Positioned catheter tubing for unobstructed urine flow: Yes 06/15/2014  8:53 AM   Output (mL) 200 mL 06/17/2014  7:15 AM   Number of days:6       [REMOVED] Chest Tube 1 Left Pleural;Midaxillary 10 Fr. (Removed)   Removed 06/10/14 1126   Intervention/Status -20 cm H2O 06/10/2014  8:00 AM   Chest Tube Air Leak No 06/10/2014  8:00 AM   Patency Intervention Tip/tilt 06/09/2014  5:43 PM    Drainage Description Serosanguinous 06/10/2014  8:00 AM   Dressing Status Clean;Intact;Dry 06/10/2014  8:00 AM   Site Assessment Clean;Dry;Intact 06/10/2014  8:00 AM   CT Output (mL) 4 mL 06/10/2014  6:00 AM   Number of days:1       [REMOVED] Chest Tube 1 Left Midaxillary  (Removed)   Removed 06/14/14 1000   Intervention/Status -20 cm H2O 06/14/2014 12:00 PM   Chest Tube Air Leak No 06/14/2014 12:00 PM   Patency Intervention Other (Comment) 06/10/2014 11:30 AM   Drainage Description Yellow 06/14/2014 12:00 PM   Dressing Status Clean;Dry;Intact 06/14/2014 12:00 PM   Site Assessment Not assessed 06/13/2014  4:00 AM   CT Output (mL) 0 mL 06/14/2014  8:00 AM   Number of days:4     PICC Double Lumen 06/14/14 Left Basilic (Active)   Line necessity reviewed? Yes 06/16/2014 12:03 PM   Site Assessment Clean;Dry;Intact 06/16/2014 12:03 PM   Proximal Lumen Status Infusing;Flushed;Blood return noted 06/16/2014 12:03 PM   Distal Lumen Status Flushed;Blood return noted 06/16/2014 12:03 PM   Length mark (cm) 47 cm 06/14/2014  4:59 PM   Dressing Type Transparent 06/16/2014 12:03 PM   Dressing Status Clean;Dry;Intact 06/16/2014 12:03 PM   Dressing Intervention Dressing changed 06/15/2014  7:00 PM   Biopatch Used? Yes 06/16/2014 12:03 PM   End Caps Free From Blood Yes 06/16/2014 12:03 PM   Line Used For Blood Draw Yes 06/16/2014 12:03 PM   Dressing Change Due 06/22/14 06/16/2014 12:03 PM   Number of days:3                         Time spent for evaluation, management and coordination of care:   Bedelia Person, M.D.  06/17/2014  9:22 AM

## 2014-06-17 NOTE — Progress Notes (Signed)
Patient discharged to [facility]. PICC flushed and intact. Report called to receiving nurse at [facility]. Family with patient. Report given to PTS.

## 2014-06-17 NOTE — Progress Notes (Signed)
Infectious Disease            Progress Note    06/17/2014   Jazper Nikolai WGN:[id number],ION:[id number] is a 80 y.o. male with past medical history significant for hyperlipidemia, who is admitted for bilateral pneumonia, sepsis and empyema.     Subjective:     [name] today Symptoms:  Stable. Feeling better, more awake and alert.  Leukocytosis resolved.  Afebrile. Feeling better. Cultures growing strep intermedius. Afebrile. No diarrhea, abdominal pain. Other review of system is non contributory.    Objective:     Blood pressure 123/58, pulse 96, temperature 98.1 F (36.7 C), temperature source Temporal Artery, resp. rate 26, height 1.803 m (5\' 11" ), weight 91.6 kg (201 lb 15.1 oz), SpO2 95 %.    General Appearance: Comfortable  HEENT: Pallor negative, Anicteric.     Lungs: Decreased breath sounds.   Heart:  S1 and S2.   Chest Wall: Symmetric chest wall expansion.   Abdomen: Obese, bowel sounds are positive.  Neurological:  More comfortable and responsive today    Laboratory And Diagnostic Studies:     Recent Labs      06/16/14   0521  06/15/14   0630   WBC  9.51  12.60*   HEMOGLOBIN  10.6*  8.4*   HEMATOCRIT  32.0*  25.2*   PLATELETS  307  392   NEUTROPHILS  71  86     Recent Labs      06/16/14   0521  06/15/14   0630   SODIUM  136  138   POTASSIUM  3.8  4.0   CHLORIDE  102  103   CO2  24  27   BUN  16.0  15.4   CREATININE  0.7  0.7   GLUCOSE  100  95   CALCIUM  7.8*  7.9         Current Med's:     Current Facility-Administered Medications   Medication Dose Route Frequency   . albuterol-ipratropium  3 mL Nebulization Q4H WA   . cefTRIAXone  2 g Intravenous Q24H SCH   . famotidine  20 mg Oral Daily   . folic acid  1 mg Oral Daily   . heparin (porcine)  5,000 Units Subcutaneous Q12H [facility]   . lactobacillus/streptococcus  1 capsule Oral Daily   . QUEtiapine  6.25 mg Oral QHS   . sodium chloride (PF)  10 mL Intracatheter Daily   . thiamine  100 mg Oral Daily         Assessment:      Condition.  Improving.      Systemic inflammatory response syndrome, sepsis.   Strep intermedius infection   Bilateral pneumonia.   Respiratory failure   Empyema: status post chest tube removal   Hyperlipidemia.    Plan:      Continue  rocephin   Discontinue  clindamycin   Continue supportive care   Interventional radiology followup   Discussed with patient's daughter and family   Discussed with Dr. Truett Perna   Can be transferred from ID perspective with close outpatient monitoring            Jakyria Bleau Robinette Haines, M.D.,FACP  06/17/2014  9:05 AM

## 2014-06-17 NOTE — Progress Notes (Signed)
Deferral for home health services.  Per CM Jonetta Osgood, patient to be discharge to [facility] at this time.

## 2014-06-17 NOTE — Discharge Instructions (Signed)
Discharge instructions:  . Continue rocephin for 3 weeks more  . Activity as tolertaed  . F/u infectous disease doctor in 1 wk  . F/u pulmonologist in 1wk  . F/u family doc in 1 wk  . You have abnormal ct of the head with finding of mass in the sella region. This needs to be evaluated outpatient with an mri on a nonemergent basis

## 2014-06-17 NOTE — Discharge Summary (Signed)
[facility] Hospitalist Discharge Note    Note Date: 06/17/2014  9:59 AM  Patient Name:Philemon ROMIN DIVITA  ZOX:[id number]  PCP: Patsy Lager, MD  Admit Date:06/09/2014  Attending Physician:Rane Blitch, Elissa Hefty, [facility] Course:   Please see H&P for complete details of HPI and ROS. The patient was admitted to [facility] and has been diagnosed with the following conditions and has been taken care as mentioned below.    Patient Active Problem List    Diagnosis Date Noted   . Sellar or suprasellar mass 06/17/2014     Needs further evaluation with Mri as an out patient     . Leucocytosis 06/17/2014   . Pneumonia 06/15/2014   . Streptococcus pneumoniae 06/15/2014     strepotococcus intermedus pneumonia. Pleural fluid positive     . Empyema lung 06/15/2014   . Delirium 06/15/2014   . Respiratory failure 06/09/2014     -------------------------------  Final diagnosis:  . Resp. Failure at [employer] acquired pneumonia 2o Streptococcus intermedius complicated with Lung empyema. S/p Chest tube drainage. Resolved and tubes removed  . Delirium during the hospital stay improved  . **  Incidental finding of possible sella mass? In head ct. Will need to follow up as outpt ** (Slight expansion of the sella turcica with soft tissue within it.Findings could indicate a sella turcica mass. This if clinically indicated could be better evaluated with an MRI. ). Updated patient & family at bedside and they voiced understanding the importance of folow up given the fact it could be benign vs potentially malignant          -----------------------------------  Hpi Details :  WARREN LINDAHL is a 80 y.o. male history of hyperlipidemia visiting from [place] who presents to the hospital with respiratory failure. Patient arrived via plane 2 days prior to admission. He had worsening shortness of breath approximately one month prior with increasing productive sputum. He states that he went to his primary care physician who  prescribed codeine. He had not been exposed to antibiotics these past few months. Patient noted fever and chills. Family noted patient was confused with increasing sputum production. When patient's told family. He was unable to brief. They called 911. She was noted to be in respiratory distress and hypoxic. Chest CT scan indicated large lung abscess. Patient underwent chest tube placement by interventional radiology with good success. Approximately of pus came from chest tube. Patient state that he felt better after chest tube placement. Improvement in shortness of breath. He has mild right diaphragmatic pain from cough.       Hosp. Course:  Found to have pneumoinia with concern fr abcess Vs empyema. Evaluated by Ir and chest tube placed. Pleural fluid grew streptocococcus intermedius. Blood cultures negative. Initially tretaed with vanco, zosyn & levaquin. Later changed abx to rocephin & clinda based on sensitivities. Id - dr. Ralene Cork. Patient has delirium in icu requiring use of precededx drip. Now resolved. Chest tubes finally removed as drainage improved. Received picc line. Per Id, to continue only rocephin at discharge until 3 more weeks for empyema. Until 07/07/14.      ---------------------    Discharge instructions:  . Continue rocephin for 3 weeks more  . Activity as tolertaed  . F/u infectous disease doctor in 1 wk  . F/u pulmonologist in 1wk  . F/u family doc in 1 wk  . You have abnormal ct of the head with finding of mass in the sella region. This needs to  be evaluated outpatient with an mri on a nonemergent basis  --------------------  .    Type of Admission:inpatient  Medical Necessity for stay:empyema    Date of Admission:   06/09/2014  Date of Discharge:   06/17/2014   Chief Complaint:      Chief Complaint   Patient presents with   . Shortness of Breath         Discharge Diagnosis:   Hospital Problems:  Active Problems:    Respiratory failure    Pneumonia    Streptococcus pneumoniae    Empyema  lung    Delirium    Sellar or suprasellar mass    Leucocytosis    Lists the present on admission hospital problems  Present on Admission:   . Respiratory failure  Consult Input/Plan   Plan  PHARMACY TO DOSE MEDICATION  IP CONSULT TO INFECTIOUS DISEASES  Procedures performed:   No orders of the defined types were placed in this encounter.     Physical Exam:    height is 1.803 m (5\' 11" ) and weight is 91.6 kg (201 lb 15.1 oz). His temporal artery temperature is 97.9 F (36.6 C). His blood pressure is 112/57 and his pulse is 100. His respiration is 24 and oxygen saturation is 93%.   Body mass index is 28.18 kg/(m^2).  Filed Vitals:    06/17/14 0458 06/17/14 0550 06/17/14 0731 06/17/14 0930   BP: 123/58   112/57   Pulse: 96   100   Temp: 98.1 F (36.7 C)   97.9 F (36.6 C)   TempSrc: Temporal Artery   Temporal Artery   Resp: 28 26  24    Height:       Weight:       SpO2: 95%  96% 93%     Intake and Output Summary (Last 24 hours) at Date Time    Intake/Output Summary (Last 24 hours) at 06/17/14 0959  Last data filed at 06/17/14 0715   Gross per 24 hour   Intake   1249 ml   Output   1125 ml   Net    124 ml       Labs:   I have reviewed the labs  Results     Procedure Component Value Units Date/Time    Chlamydia antibodies [[id number]]  (Abnormal) Collected:  06/09/14 1843    Specimen Information:  Blood Updated:  06/16/14 2025     Chlamydophila pneumoniae, IgM <1:10      Chlamydia trachomatis, IgM <1:10      Chlamydophila psittaci, IgM <1:10      Chlamydophila pneumoniae, IgG 1:128 (H)      Chlamydia trachomatis, IgG <1:64      Chlamydophila psittaci , IgG <1:64         Rads:   I have reviewed the Radiology reports.  Echocardiogram Adult Complete W Clr/ Dopp Waveform    06/11/2014   ECHOCARDIOGRAM  Sonographer:  Alain Honey  Indications:  Endocarditis  Height (in):  71 Weight (lb):  201 Blood Pressure:  113/58    2-D Measurements  Left Ventricle                                           Diastolic Dimension:  54  (40-56 mm)  Systolic Dimension:  37  (25-40 mm)      Septal Diastolic Thickness:  10  (6-11 mm)  Posterior Wall Thickness:  10  (6-11 mm)  Fractional Shortening Percentage:  31  (24-46 %)                        Visually Estimated Ejection Fraction:  60  (55-75 %)                           Right Ventricle Diastolic Dimension:  31  (7-26 mm)                             Left Atrium End Systolic Dimension:  38  (19-40 mm)                      Aortic Root:  37  (20-37 mm)                         Doppler Measurements and Color Flow Imaging Valves                                          Aortic Valve:  1.1  (0.9-1.8 m/s).          Regurgitation:  n  Pulmonic Valve:  0.6  (0.6-0.9 m/s).      Regurgitation:  tr  Mitral Valve:  0.9  (0.6-1.4 m/s).           Regurgitation:  mild  Tricuspid Valve:  0.6  (0.4-0.8 m/s.      Regurgitation:  mild  Left Ventricular Outflow Tract Velocity:  0.9 m/s.   E/A Ratio:  0.7 Est. PASP:  29 Est. RA Mean Pressure:  5  Echocardiogram M-mode, 2D, spectral Doppler and color flow imaging were performed and interpreted.     06/11/2014     1.  The quality of the study is technically  difficult interpretation. 2.  The left ventricle is normal in size. Left ventricular systolic function is normal. There are no regional wall motion abnormalities. Estimated EF is 60%. There is no left ventricular hypertrophy. There is  evidence of abnormal diastolic LV function. 3.  The left atrium is normal in size. 4.  The aortic valve is trileaflet and sclerotic. Normal valve excursion is present. There is no significant aortic insufficiency. No aortic stenosis is present. The aortic root is normal in size. 5.  The mitral valve is thickened with annular calcification. Mild mitral insufficiency is present. 6.  The right ventricle is normal in size and function. 7.  The right atrium is normal in size. 8. The tricuspid valve is structurally normal. There is mild tricuspid insufficiency. 9. The pulmonic valve is structurally  normal. There is no significant pulmonic insufficiency. 10. There is no evidence of pulmonary hypertension. 11. No pericardial effusion, intracardiac thrombi, or masses are seen. 12. The atrial septum is structurally normal. No shunt by color flow doppler.  CONCLUSION:  Technically difficult echo.  Left ventricular function appears to be adequate.    Grade 1 diastolic dysfunction   No obvious vegetations seen. Would suggest transesophageal echo if clinically indicated   Jodelle Gross, MD  06/11/2014 12:47 PM     Ct Head Without Contrast    06/09/2014   History: Trauma with pain.  FINDINGS: Brain CT without intravenous contrast. No comparison studies.  There is diffuse parenchymal volume loss. There is mild hypodensity in the deep supratentorial white matter, consistent with mild chronic small vessel ischemic disease. There is no mass effect, acute intracranial hemorrhage. The ventricular system and cisterns are normally configured.  There is apparent slight expansion of the sella turcica with soft tissue present in it. Finding could represent a sellar mass. There are atherosclerotic calcifications in the intracranial internal carotid arteries. There is mucosal disease nearly completely opacifying the sphenoid sinus on the left side, there is mild mucosal thickening in the ethmoid air cells and in the right maxillary sinus. The calvarium is intact.     06/09/2014    1. Volume loss, mild chronic small vessel ischemic disease. 2. Slight expansion of the sella turcica with soft tissue within it. Findings could indicate a sella turcica mass. This if clinically indicated could be better evaluated with an MRI. 3. Inflammatory appearing changes in the paranasal sinuses.  Terrilee Croak, MD  06/09/2014 1:45 PM     Ct Chest Wo Contrast    06/12/2014   Addendum: Additional finding  There is an additional drainage catheter in the left upper lobe for drainage of an empyema in the major fissure. There is near complete  resolution of the previous empyema in the left major fissure. The remainder of the report is unchanged.  Annamary Carolin, MD  06/12/2014 8:10 AM     06/12/2014   CLINICAL HISTORY: Follow-up of CT guided drainage of a left lower lobe empyema.  COMPARISON: Chest CT dated 06/09/2014  TECHNIQUE: Helical axial images were reconstructed through the thorax in 5 mm increments . High resolution images were subsequently reconstructed. No intravenous contrast was utilized.  FINDINGS:  The visualized thyroid gland is normal.  The heart size is normal without evidence of a pericardial effusion. Extensive coronary artery calcifications are noted.  There is mediastinal adenopathy measuring up to 2 cm which appears largely unchanged. Subcentimeter hilar adenopathy is also noted and appears stable.  There remains consolidations of the left upper lobe, lingula, right middle lobe and right lower lobe which appear mildly worsened since the prior study. There is a drainage catheter in the left lower lobe with near complete resolution of the previous empyema. Mild postprocedural changes are noted of the left hemithorax.  The visualized upper abdomen is unremarkable.  The visualized osseous structures are within normal limits for the patient's age.     06/12/2014    1. Drainage catheter in the left lower lobe with near complete resolution of the previous empyema. 2. Worsening multifocal pneumonia as above.  Annamary Carolin, MD  06/11/2014 9:24 AM     Ct Angio Chest (pe Study)    06/09/2014   Clinical Indication: Shortness of breath. Comparison chest x-ray performed the same day.  Technique: CTA of the chest was performed with nonionic contrast using pulmonary embolus protocol. Coronal and sagittal MIPs of the chest were obtained.  Findings: Exam slightly limited by respiratory motion. There is no evidence of pulmonary embolus or aortic dissection. No axillary adenopathy. There is mediastinal adenopathy. Precarinal vascular lymph node measures  2.0 x 1.5 cm. AP window lymph node measures 1.3 x 1.5 cm. Subcarinal lymph node measures 1.5 cm. Left hilar lymph node measures 2.1 x 1.2 cm. Small loculated left pleural effusion is present. Large air-fluid collection in the left lower lobe measures 11.5 x 2.6 cm. Collection likely represents lung abscess..   The heart is normal in size. Coronary vascular calcifications are present.  Calcifications are seen in the region of the aortic valve. Esophagus is not dilated.  Adrenal glands are mildly nodular. Otherwise the visualized portions of the upper abdomen are unremarkable given arterial phase imaging.  Airspace opacities in the left upper lobe and right middle lobe with air bronchograms. Bilateral lower lobe bronchial wall thickening is present. Patchy nodular glass opacities are present in the right lower lobe. Central airways are patent. Degenerative changes are present in the thoracic spine.     06/09/2014    1. Exam limited by respiratory motion. No evidence of pulmonary embolus or aortic dissection. 2. Bilateral pneumonia with large air-fluid collection in the left lower lobe, likely representing lung abscess. 3. Other findings as above.  Critical findings discussed and acknowledged by PA Cyndie Chime on 06/09/2014 3:08 PM  Jasmine December  D'Heureux, MD  06/09/2014 3:10 PM     Ct Cervical Spine Without Contrast    06/09/2014   History: Trauma with pain.  FINDINGS: Contiguous axial, reformatted coronal and sagittal images through the cervical spine. There are no comparison studies.  There is approximately 2 mm retrolisthesis at the C5-C6 level, 1 mm anterolisthesis at the C7-T1 level. This probably is degenerative in nature. The vertebral body heights are normally maintained. No acute cervical spine fractures are seen.  At the C3-C4 level there are degenerative uncovertebral and facet changes on the right side, there is moderate to marked narrowing of the right neural foramen. At the C4-C5 level there are degenerative  uncovertebral changes on the right, mild narrowing of the right neural foramen. At the C5-C6 level there is disc height narrowing, there are degenerative facet changes on the right, degenerative uncovertebral changes bilaterally, moderate neural foraminal narrowing. At the C6-C7 level there is disc height narrowing, there are degenerative uncovertebral changes, there is moderate to marked narrowing of the right neural foramen. There is mild to moderate narrowing of the right C7-T1 neural foramen related to degenerative uncovertebral changes.     06/09/2014    Degenerative changes. No CT evidence of an acute osseous injury to the cervical spine.  Terrilee Croak, MD  06/09/2014 2:07 PM     Xr Chest Ap Portable    06/17/2014   TECHNIQUE:  AP chest  INDICATION: Pneumonia.  COMPARISON: Chest radiograph dated one day prior.  FINDINGS:   Left PICC with tip projecting at the SVC. Multiple leads project over the chest.  Patchy opacities in the mid and lower portions of both lungs, likely pneumonia, unchanged. Cannot exclude small bilateral pleural effusions. No edema. No pneumothorax. Cardiopericardial contour is partially obscured by adjacent opacity, thus cannot adequately assess for enlargement or interval change.      06/17/2014    Patchy opacities in the mid and lower portions of both lungs, likely pneumonia, unchanged.   Cannot exclude small bilateral pleural effusions.   Johnsie Kindred, MD  06/17/2014 7:25 AM     X-ray Chest Ap Portable    06/16/2014   HISTORY: Follow-up lung opacities    COMPARISON: Chest radiograph from one day prior.  Portable chest radiograph. There has been interval removal of the left basilar chest tube. Left PICC terminates in the middle third of the superior vena cava. There is no pneumothorax. There are stable trace right and small left pleural effusions. Extensive patchy bibasilar consolidation is not appreciably changed.     06/16/2014      1. Stable extensive patchy bibasilar consolidation,  suspicious for multifocal pneumonia.  2. Stable trace right and small  left pleural effusions.    Cleone Slim, MD  06/16/2014 8:56 AM     X-ray Chest Ap Portable    06/15/2014   HISTORY: Follow-up pneumonia in this patient admitted with sepsis and empyema.  COMPARISON: 06/14/2014 and additional exams dating to chest CT of 06/09/2014.  FINDINGS: Single portable view of the chest was obtained. Cardiac size is unchanged. A left PICC terminates in the region of the mid superior vena cava. A pleural drainage catheter remains in the left lower lung. Patchy multifocal bibasilar and lingular airspace opacities persist. Trace effusions are suspected. There is no pneumothorax.      06/15/2014     1. No significant change in lines and tubes. 2. Unchanged multifocal pneumonia. 3. Negative for pneumothorax.  Elizebeth Koller, MD  06/15/2014 8:26 AM     Xr Chest Ap Portable    06/14/2014   Indication: Status post PICC insertion.  Procedure: Portable AP view of the chest.  Comparison: November 3.  Findings: There is a new left upper extremity PICC with tip in the superior vena cava. A pigtail drain previously seen in the superior left chest has been removed. Left basilar pigtail pleural drain unchanged. Unchanged predominantly inferior lung opacities. No visible pneumothorax.     06/14/2014   Impression: 1. New left upper extremity PICC with tip in the superior vena cava. 2. One of the left-sided chest tubes has been removed.  Wynema Birch, MD  06/14/2014 5:32 PM     X-ray Chest Ap Portable    06/14/2014   INDICATION: 80 year old male with history of hyperlipidemia.  Status post empyema drainage on 06/09/2014.                COMPARISON: Chest radiograph from yesterday.         TECHNIQUE: XR CHEST AP PORTABLE: AP portable sitting upright  FINDINGS:  The two small bore left thoracostomy pigtail catheters appear unchanged. One tube projects over the left base medially and the other over the left upper lobe laterally.  Extensive  bibasilar opacities appear grossly unchanged and continue to silhouette both hemidiaphragms.  The upper lobes show no focal infiltrate or mass. No cavitary lung lesions are identified.      No obvious pulmonary edema is seen, but mild pulmonary edema is difficult to exclude. An azygos fissure is seen in the right upper lobe which is a normal anatomic variant.      No pneumothorax is seen. There is no large pleural effusion.  The heart is not enlarged.      The upper mediastinum does not appear widened.      The left hilar structures are not well seen.  The right hilar structures appear unremarkable.       There is no apparent subdiaphragmatic free air.      No significant bony abnormalities detected.  Multiple cardiac monitoring wires overlie the chest.            06/14/2014    Portable AP radiographic examination of the chest shows no significant change in bibasilar opacities. No definite pneumothorax or large pleural effusion.      There is no significant change from the prior exam.  Miguel Dibble, MD  06/14/2014 6:39 AM     X-ray Chest Ap Portable    06/13/2014   INDICATION: 80 year old male with history of empyema.  Status post pleural drainage on 06/09/2014.  COMPARISON: Chest radiograph from yesterday. CT chest 06/11/2014     TECHNIQUE: XR CHEST AP PORTABLE: AP portable semierect  FINDINGS:  Smallbore thoracostomy pigtail catheters appear unchanged with tips overlying the medial left lung base and left upper lobe laterally. Bibasilar opacities appear grossly unchanged.  Overall lung volumes are shallow.       The upper lobes show no focal infiltrate or mass. An azygos fissure is seen in the right upper lobe which is a normal anatomic variant.      No obvious pulmonary edema is seen, but mild pulmonary edema is difficult to exclude.      No pneumothorax is seen. There is no obvious large pleural effusion. However, small pleural effusions are not excluded.       The heart is not enlarged.      There is no  mediastinal shift and the upper mediastinum does not appear widened.      The left hilar structures are not well seen.  The right hilar structures appear unremarkable.       There is no apparent subdiaphragmatic free air.      No significant bony abnormalities detected.  Spinal degenerative changes are noted. Multiple cardiac monitoring wires overlie the chest.        06/13/2014    Portable AP radiographic examination of the chest shows no significant change in bibasilar opacities and left-sided chest tubes. These opacities are likely due to a combination of factors including basilar lung consolidation and pleural effusions. At this point, I feel that the majority of the opacity is due to lung consolidation.   Overall lung volumes are shallow.     There is no significant change from the prior exam.  Miguel Dibble, MD  06/13/2014 5:32 AM     X-ray Chest Ap Portable    06/12/2014   CLINICAL HISTORY: Multifocal pneumonia  COMPARISON: Chest CT dated 06/11/2014  AP view of the chest was performed.  FINDINGS:  There are two left-sided drainage pigtail catheters in place. There remain consolidations of the right lower lobe and left lower lobe consistent with the patient's multifocal pneumonia which is largely unchanged.  The heart size and bones are within normal limits.     06/12/2014     Multifocal pneumonia, unchanged.  Annamary Carolin, MD  06/12/2014 8:11 AM     X-ray Chest Ap Portable    06/10/2014   Clinical history: Left empyema drainage.  FINDINGS: AP view of the chest. Compared with 06/09/2014.   Central silhouette is stable. Pulmonary vascularity is normal.   Left-sided chest tube tips over the upper lateral and lower medial left hemithorax. Persistent loculated fluid along the left lateral chest. Patchy airspace disease throughout the left lung with perihilar and basilar predominance appears stable. Right basilar airspace disease has mildly increased. New left-sided subcutaneous emphysema.     06/10/2014     1. Interval  placement of 2 left-sided chest tubes. Stable left lung airspace disease and loculated fluid along the left lateral chest. New left-sided subcutaneous emphysema. 2. Mildly increased right basilar airspace disease.  Darra Lis, MD  06/10/2014 8:49 AM     Xr Chest  Ap Portable    06/09/2014   History: Shortness of breath.  Comparison: None.  Findings: Portable chest x-ray is obtained. Elevation of the left hemidiaphragm with left lower lobe infiltrate and probable effusion is present. The right lung is clear. Heart size is within normal limits. Degenerative changes are present within the spine.  06/09/2014   Impression: Left lower lobe infiltrate, probable effusion and elevated left hemidiaphragm.  Phillips Odor, MD  06/09/2014 1:26 PM     Abscess Drain Check/change    06/10/2014   HISTORY: Empyema. The anterior superior drainage catheter has backed partially out of the pleural cavity.  KEY COMPONENT: Exchange of empyema drainage catheter under fluoroscopic guidance.  ANESTHESIA:  Local with 1% lidocaine.  SEDATION:  Benadryl and fentanyl.  CONTRAST: None.  SPECIMENS: None.  COMPLICATIONS: None.  PROCEDURE:  The nature of the procedure, risks, benefits and alternatives were discussed with the patient.  All questions were answered and consent was obtained.  Using standard sterile technique, a 0.035 inch Amplatz guidewire was advanced through the drainage catheter into the pleural cavity under fluoroscopic guidance.  The existing catheter was removed over a 0.035 inch Amplatz guidewire and an attempt was made to advance a 12 French catheter directly over the guidewire. However, this was not possible.  A 5 French Kumpe catheter was advanced into the pleural cavity over the guidewire. A new Amplatz guidewire was advanced into the pleural cavity and an 8 French 25 cm sheath was advanced into the pleural cavity. A 0.035 inch Lunderquist guidewire was then advanced into the pleural cavity. The sheath was  then removed over a 0.035 inch Lunderquist guidewire. The 12 French locking pigtail drainage catheter was successfully advanced into the chest. The Cope loop lateral locking apparatus was engaged. The catheter was secured to the skin with 0 silk suture material. Vaseline gauze and a sterile dressing was applied. TPA/dornase was then instilled into this catheter and the posterior lower chest to and both tubes were clamped. The procedure was well tolerated.     06/10/2014     1. The anterior upper drainage catheter had partially pulled out of the pleural cavity. 2. The existing catheter was exchanged for a larger 12 French locking pigtail catheter.  COMMENT: TPA/dornase was instilled into both drainage catheters. The tubes were clamped and will be opened to Pleur-evac with suction and 2 hours.  Joselyn Arrow, MD  06/10/2014 1:27 PM     Picc Line Placement    06/15/2014   HISTORY:  Requires IV access for antibiotics.  PICC line insertion at the bedside in the IR Recovery:  PROCEDURE: The nature of the procedure, risks, benefits, and alternatives were discussed with patient's daughter. All questions were answered and consent was obtained.  This procedure was performed by Berline Lopes, RT under my supervision.  Ultrasound was used to confirm patency of the superficial vein prior to puncture.  Using standard sterile technique and 1% lidocaine anesthesia, puncture of the vein was performed with a 21 gauge single wall needle under direct sonographic guidance.  Sonographic images were recorded pre- and post- puncture for documentation.   A 0.018 inch guidewire was advanced into the vessel. The needle was removed and a peel-away sheath was placed. Anatomic landmarks were used to estimate appropriate catheter length. The catheter was then trimmed and was advanced through the peel-away sheath. The sheath was removed. Excellent blood return and fluid infusion was confirmed.  The lumen was flushed and capped. The catheter was  secured with an adhesive device and a sterile dressing was applied.      06/15/2014    Successful placement of a 5 French double lumen PICC catheter via left basilic venous approach at bedside in recovery under ultrasound guidance. 47 cm catheter length was used. A chest x-ray will be obtained for tip  localization.  Lemar Livings, MD  06/15/2014 4:53 PM     Ct Guided Insert Pleural Cath    06/09/2014   HISTORY: Empyema versus pulmonary abscess at the posterior left lung base. Loculated left pleural effusion.  Key components: 1. CT-guided drainage of left empyema. 2. CT-guided drainage of pulmonary abscess versus second empyema collection.  ANESTHESIA:  Local with 1% lidocaine.  SEDATION:  Benadryl and fentanyl.  CONTRAST: None.  SPECIMENS: Sent for gram stain, culture and sensitivity.  COMPLICATIONS: None.  PROCEDURE:  The nature of the procedure, risks, benefits and alternatives were discussed with the patient.  All questions were answered and consent was obtained.  The patient was placed in a right lateral decubitus position on the CT scanner.  A limited CT was performed to localize the collections. Drainage of a collection extending into the superior aspect of the interlobar fissure was performed first via anterolateral approach. Using standard sterile technique, an 18 gauge Yueh needle was advanced into the collection. Brown opaque purulent material was aspirated. Appropriate needle tip position was ascertained with CT fluoroscopy.  The cannula was exchanged over a 0.035 inch Amplatz guidewire for a  10 French non locking pigtail catheter. The catheter was secured to the skin with suture material and was attached to a Pleur-evac.   The larger, mainly gas containing collection at the posterior lung base was approached at the posterior lateral base. Using standard sterile technique, an 18 gauge Yueh needle was advanced into the collection. Air and a brownish purulent material was aspirated. Appropriate needle tip  position was ascertained with CT fluoroscopy.  The cannula was exchanged over a 0.035 inch Amplatz guidewire for a 12 French locking pigtail catheter which was then advanced into the cavity over the guidewire. The Cope loop locking apparatus was engaged. The catheter was secured to the skin with suture material and was connected to a Pleur-evac. Vaseline gauze and sterile dressings were applied. CT images obtained following the drainage demonstrated appropriate position of the catheters. The procedure was well tolerated.     06/09/2014     1. Successful CT-guided drainage of a left empyema extending into the interlobar fissure.  A 10 French non locking pigtail catheter was placed returning  60 mL of purulent material. A specimen was sent for culture. 2. Successful CT-guided drainage of a gas containing collection at the posterior left lung base, likely also an empyema, although lung abscess is possible. A 12 French locking pigtail catheter was placed returning purulent material. A specimen was sent for culture.    Joselyn Arrow, MD  06/09/2014 6:53 PM     Discharge Medications:        Discharge Medication List      Taking          aspirin 81 MG chewable tablet   Dose:  81 mg   Chew 81 mg by mouth daily.       atorvastatin 10 MG tablet   Dose:  10 mg   Commonly known as:  LIPITOR   Take 10 mg by mouth daily.       cefTRIAXone 2 G injection   Dose:  2 g   Commonly known as:  ROCEPHIN   Inject 2,000 mg (2 g total) into the vein daily.       fenofibrate 54 MG tablet   Dose:  54 mg   Commonly known as:  TRICOR   Take 54 mg by mouth daily.       lactobacillus/streptococcus  Caps   Dose:  1 capsule   Take 1 capsule by mouth daily.       PRESERVISION AREDS 2 PO   Dose:  1 capsule   Take 1 capsule by mouth 2 (two) times daily.       sulindac 200 MG tablet   Dose:  200 mg   Commonly known as:  CLINORIL   Take 200 mg by mouth 2 (two) times daily.             Pending Labs:           Unresulted Labs     Procedure . . . Date/Time     X-ray chest AP portable [[id number]] Resulted:  06/17/14 0819     Updated:  06/17/14 0937    Chest Tube Removal [[id number]] Resulted:  06/15/14 1532     Updated:  06/15/14 1537    Mycoplasma pneumoniae Ab (IgG,IgM),EIA [[id number]] Collected:  06/09/14 1843     Updated:  06/10/14 2231         Discharge Destination:   rehab   Condition at Discharge :   stable   Labs/Images to be followed at your PCP office     Follow-up:     Follow-up Information     Follow up with Pcp, Notonfile, MD. Schedule an appointment as soon as possible for a visit in 1 week.        Follow up with Alfonzo Beers, MD. Schedule an appointment as soon as possible for a visit in 1 week.    Specialties:  Infectious Disease, Internal Medicine    Contact information:    [id number] [facility]  [address]  [place] [address]  [phone]          Follow up with Mendiguren, Leonia Reader I, MD. Schedule an appointment as soon as possible for a visit in 1 week.    Specialties:  Pulmonary Disease, Critical Care Medicine    Contact information:    [phone] [facility] Staff Office  [place] [address]  [phone]             Time spent for Discharge Care:        Signed by: Bedelia Person, MD

## 2014-06-17 NOTE — Plan of Care (Signed)
Problem: Safety  Goal: Patient will be free from injury during hospitalization  Outcome: Progressing  Provide and maintain safe environment  Hourly rounding  Call bell within reach.    Problem: Pain  Goal: Patient's pain/discomfort is manageable  Outcome: Progressing  Will give pain medications as needed.

## 2014-06-22 NOTE — Retrospective Coding Query (Signed)
PHYSICIAN'S DOCUMENTATION                                                                      REQUEST                                                                         Date of Request:  06/22/2014  Type of Request:  DOCUMENTATION CLARIFICATION                                         Patient Name: Brian Trevino, Brian Trevino  Account #: [id number]  MR #: [id number]  Discharge Date: 06/17/2014      Dear Dr. Truett Perna:    The medical record reflects the following     The patient presented with fever, fatigue, P 116, RR 24 and WBC 23.71 and was diagnosed with empyema lung and streptococcus intermedus pneumonia.  Sepsis was documented in ER records and Infectious consultation 10/30 by Dr Janalyn Rouse.   DxSum does not mention sepsis    Question to Physician:    Please clarify if sepsis is an appropriate diagnosis that explained the patient's clinical picture or if it was ruled out.    PHYSICIAN RESPONSE:                    Coder Waylan Boga  Date 06/22/2014         Sepsis with [facility] at [facility] with documentation of infection of empyema lung and streptococcus intermedus pneumonia even though no bacteremia.    Per up to date as of today , Sepsis -- Sepsis is the clinical syndrome that results from a dysregulated inflammatory response to an infection that is non resolving and deleterious, often leading to organ dysfunction. Sepsis is defined as the presence (probable or documented) of infection together with systemic manifestations of infection.     Not specifying the need of bacteremia per se to call it sepsis.

## 2014-06-23 ENCOUNTER — Ambulatory Visit
Admission: RE | Admit: 2014-06-23 | Discharge: 2014-06-23 | Disposition: A | Discharge status: Home / Self Care | Payer: Medicare Other | Source: Ambulatory Visit | Attending: Internal Medicine | Admitting: Internal Medicine

## 2014-06-23 ENCOUNTER — Other Ambulatory Visit: Payer: Self-pay | Admitting: Internal Medicine

## 2014-06-23 DIAGNOSIS — R059 Cough, unspecified: Secondary | ICD-10-CM

## 2014-06-24 ENCOUNTER — Other Ambulatory Visit: Payer: Self-pay | Admitting: Internal Medicine

## 2014-06-24 DIAGNOSIS — J90 Pleural effusion, not elsewhere classified: Secondary | ICD-10-CM

## 2014-06-26 ENCOUNTER — Other Ambulatory Visit: Payer: Self-pay | Admitting: Internal Medicine

## 2014-06-27 ENCOUNTER — Ambulatory Visit: Payer: Medicare Other | Attending: Internal Medicine

## 2014-06-27 ENCOUNTER — Ambulatory Visit (HOSPITAL_BASED_OUTPATIENT_CLINIC_OR_DEPARTMENT_OTHER): Payer: Medicare Other

## 2014-06-27 DIAGNOSIS — J90 Pleural effusion, not elsewhere classified: Secondary | ICD-10-CM | POA: Insufficient documentation

## 2014-06-27 LAB — CBC WITH MANUAL DIFFERENTIAL
Atypical Lymphocytes %: 3 %
Atypical Lymphocytes Absolute: 0.23 10*3/uL — ABNORMAL HIGH
Band Neutrophils Absolute: 0.08 10*3/uL (ref 0.00–1.00)
Band Neutrophils: 1 %
Basophils Absolute Manual: 0 10*3/uL (ref 0.00–0.20)
Basophils Manual: 0 %
Cell Morphology: ABNORMAL — AB
Eosinophils Absolute Manual: 0.15 10*3/uL (ref 0.00–0.70)
Eosinophils Manual: 2 %
Hematocrit: 27.7 % — ABNORMAL LOW (ref 42.0–52.0)
Hgb: 9.1 g/dL — ABNORMAL LOW (ref 13.0–17.0)
Lymphocytes Absolute Manual: 1.47 10*3/uL (ref 0.50–4.40)
Lymphocytes Manual: 19 %
MCH: 29 pg (ref 28.0–32.0)
MCHC: 32.9 g/dL (ref 32.0–36.0)
MCV: 88.2 fL (ref 80.0–100.0)
MPV: 10.2 fL (ref 9.4–12.3)
Monocytes Absolute: 0.31 10*3/uL (ref 0.00–1.20)
Monocytes Manual: 4 %
Neutrophils Absolute Manual: 5.5 10*3/uL (ref 1.80–8.10)
Nucleated RBC: 0 /100 WBC (ref 0–1)
Platelet Estimate: NORMAL
Platelets: 426 10*3/uL — ABNORMAL HIGH (ref 140–400)
RBC: 3.14 10*6/uL — ABNORMAL LOW (ref 4.70–6.00)
RDW: 18 % — ABNORMAL HIGH (ref 12–15)
Segmented Neutrophils: 71 %
WBC: 7.74 10*3/uL (ref 3.50–10.80)

## 2014-06-27 LAB — COMPREHENSIVE METABOLIC PANEL
ALT: 11 U/L (ref 0–55)
AST (SGOT): 21 U/L (ref 5–34)
Albumin/Globulin Ratio: 0.6 — ABNORMAL LOW (ref 0.9–2.2)
Albumin: 2.3 g/dL — ABNORMAL LOW (ref 3.5–5.0)
Alkaline Phosphatase: 75 U/L (ref 38–106)
Anion Gap: 9 (ref 5.0–15.0)
BUN: 20 mg/dL (ref 9.0–28.0)
Bilirubin, Total: 0.5 mg/dL (ref 0.2–1.2)
CO2: 24 mEq/L (ref 22–29)
Calcium: 8.4 mg/dL (ref 7.9–10.2)
Chloride: 103 mEq/L (ref 100–111)
Creatinine: 0.7 mg/dL (ref 0.7–1.3)
Globulin: 3.9 g/dL — ABNORMAL HIGH (ref 2.0–3.6)
Glucose: 81 mg/dL (ref 70–100)
Potassium: 4.5 mEq/L (ref 3.5–5.1)
Protein, Total: 6.2 g/dL (ref 6.0–8.3)
Sodium: 136 mEq/L (ref 136–145)

## 2014-06-27 LAB — GFR: EGFR: >60

## 2014-07-04 ENCOUNTER — Ambulatory Visit (HOSPITAL_BASED_OUTPATIENT_CLINIC_OR_DEPARTMENT_OTHER): Payer: Medicare Other

## 2014-07-04 ENCOUNTER — Ambulatory Visit: Payer: Medicare Other

## 2014-07-04 ENCOUNTER — Other Ambulatory Visit: Payer: Self-pay | Admitting: Internal Medicine

## 2014-07-04 ENCOUNTER — Ambulatory Visit
Admission: RE | Admit: 2014-07-04 | Discharge: 2014-07-04 | Disposition: A | Discharge status: Home / Self Care | Payer: Medicare Other | Source: Ambulatory Visit | Attending: Internal Medicine | Admitting: Internal Medicine

## 2014-07-04 ENCOUNTER — Other Ambulatory Visit: Payer: Medicare Other

## 2014-07-04 DIAGNOSIS — J851 Abscess of lung with pneumonia: Secondary | ICD-10-CM

## 2014-07-04 DIAGNOSIS — J939 Pneumothorax, unspecified: Secondary | ICD-10-CM | POA: Insufficient documentation

## 2014-07-04 LAB — COMPREHENSIVE METABOLIC PANEL
ALT: 10 U/L (ref 0–55)
AST (SGOT): 20 U/L (ref 5–34)
Albumin/Globulin Ratio: 0.7 — ABNORMAL LOW (ref 0.9–2.2)
Albumin: 2.7 g/dL — ABNORMAL LOW (ref 3.5–5.0)
Alkaline Phosphatase: 68 U/L (ref 38–106)
Anion Gap: 10 (ref 5.0–15.0)
BUN: 16 mg/dL (ref 9.0–28.0)
Bilirubin, Total: 0.6 mg/dL (ref 0.2–1.2)
CO2: 22 mEq/L (ref 22–29)
Calcium: 9 mg/dL (ref 7.9–10.2)
Chloride: 103 mEq/L (ref 100–111)
Creatinine: 0.7 mg/dL (ref 0.7–1.3)
Globulin: 3.9 g/dL — ABNORMAL HIGH (ref 2.0–3.6)
Glucose: 92 mg/dL (ref 70–100)
Potassium: 3.8 mEq/L (ref 3.5–5.1)
Protein, Total: 6.6 g/dL (ref 6.0–8.3)
Sodium: 135 mEq/L — ABNORMAL LOW (ref 136–145)

## 2014-07-04 LAB — CBC AND DIFFERENTIAL
Basophils Absolute Automated: 0.07 10*3/uL (ref 0.00–0.20)
Basophils Automated: 1 %
Eosinophils Absolute Automated: 0.65 10*3/uL (ref 0.00–0.70)
Eosinophils Automated: 7 %
Hematocrit: 31.1 % — ABNORMAL LOW (ref 42.0–52.0)
Hgb: 10.3 g/dL — ABNORMAL LOW (ref 13.0–17.0)
Lymphocytes Absolute Automated: 2 10*3/uL (ref 0.50–4.40)
Lymphocytes Automated: 23 %
MCH: 29.1 pg (ref 28.0–32.0)
MCHC: 33.1 g/dL (ref 32.0–36.0)
MCV: 87.9 fL (ref 80.0–100.0)
MPV: 10.1 fL (ref 9.4–12.3)
Monocytes Absolute Automated: 0.79 10*3/uL (ref 0.00–1.20)
Monocytes: 9 %
Neutrophils Absolute: 5.25 10*3/uL (ref 1.80–8.10)
Neutrophils: 60 %
Platelets: 233 10*3/uL (ref 140–400)
RBC: 3.54 10*6/uL — ABNORMAL LOW (ref 4.70–6.00)
RDW: 18 % — ABNORMAL HIGH (ref 12–15)
WBC: 8.76 10*3/uL (ref 3.50–10.80)

## 2014-07-04 LAB — GFR: EGFR: >60

## 2014-07-11 ENCOUNTER — Ambulatory Visit: Payer: Medicare Other | Attending: Internal Medicine

## 2014-07-11 ENCOUNTER — Ambulatory Visit: Payer: Medicare Other

## 2014-07-11 ENCOUNTER — Other Ambulatory Visit: Payer: Self-pay | Admitting: Internal Medicine

## 2014-07-11 DIAGNOSIS — J851 Abscess of lung with pneumonia: Secondary | ICD-10-CM

## 2014-07-11 LAB — COMPREHENSIVE METABOLIC PANEL
ALT: 7 U/L (ref 0–55)
AST (SGOT): 19 U/L (ref 5–34)
Albumin/Globulin Ratio: 0.8 — ABNORMAL LOW (ref 0.9–2.2)
Albumin: 3 g/dL — ABNORMAL LOW (ref 3.5–5.0)
Alkaline Phosphatase: 66 U/L (ref 38–106)
Anion Gap: 10 (ref 5.0–15.0)
BUN: 21 mg/dL (ref 9.0–28.0)
Bilirubin, Total: 0.4 mg/dL (ref 0.2–1.2)
CO2: 25 mEq/L (ref 22–29)
Calcium: 9.4 mg/dL (ref 7.9–10.2)
Chloride: 104 mEq/L (ref 100–111)
Creatinine: 0.8 mg/dL (ref 0.7–1.3)
Globulin: 3.9 g/dL — ABNORMAL HIGH (ref 2.0–3.6)
Glucose: 96 mg/dL (ref 70–100)
Potassium: 4.1 mEq/L (ref 3.5–5.1)
Protein, Total: 6.9 g/dL (ref 6.0–8.3)
Sodium: 139 mEq/L (ref 136–145)

## 2014-07-11 LAB — CBC AND DIFFERENTIAL
Basophils Absolute Automated: 0.05 10*3/uL (ref 0.00–0.20)
Basophils Automated: 1 %
Eosinophils Absolute Automated: 0.42 10*3/uL (ref 0.00–0.70)
Eosinophils Automated: 6 %
Hematocrit: 31.7 % — ABNORMAL LOW (ref 42.0–52.0)
Hgb: 10.9 g/dL — ABNORMAL LOW (ref 13.0–17.0)
Lymphocytes Absolute Automated: 2.25 10*3/uL (ref 0.50–4.40)
Lymphocytes Automated: 30 %
MCH: 30.9 pg (ref 28.0–32.0)
MCHC: 34.4 g/dL (ref 32.0–36.0)
MCV: 89.8 fL (ref 80.0–100.0)
MPV: 10.2 fL (ref 9.4–12.3)
Monocytes Absolute Automated: 0.63 10*3/uL (ref 0.00–1.20)
Monocytes: 8 %
Neutrophils Absolute: 4.05 10*3/uL (ref 1.80–8.10)
Neutrophils: 55 %
Platelets: 267 10*3/uL (ref 140–400)
RBC: 3.53 10*6/uL — ABNORMAL LOW (ref 4.70–6.00)
RDW: 19 % — ABNORMAL HIGH (ref 12–15)
WBC: 7.4 10*3/uL (ref 3.50–10.80)

## 2014-07-11 LAB — GFR: EGFR: >60

## 2014-07-14 ENCOUNTER — Ambulatory Visit
Admission: RE | Admit: 2014-07-14 | Discharge: 2014-07-14 | Disposition: A | Discharge status: Home / Self Care | Payer: Medicare Other | Source: Ambulatory Visit | Attending: Critical Care Medicine | Admitting: Critical Care Medicine

## 2014-07-14 ENCOUNTER — Other Ambulatory Visit: Payer: Self-pay | Admitting: Critical Care Medicine

## 2014-07-14 DIAGNOSIS — J984 Other disorders of lung: Secondary | ICD-10-CM

## 2014-07-14 DIAGNOSIS — J449 Chronic obstructive pulmonary disease, unspecified: Secondary | ICD-10-CM | POA: Insufficient documentation

## 2016-06-05 ENCOUNTER — Ambulatory Visit
Admission: RE | Admit: 2016-06-05 | Discharge: 2016-06-05 | Disposition: A | Discharge status: Home / Self Care | Payer: Medicare Other | Source: Ambulatory Visit | Attending: Family Medicine | Admitting: Family Medicine

## 2016-06-05 ENCOUNTER — Other Ambulatory Visit: Payer: Self-pay | Admitting: Family Medicine

## 2016-06-05 ENCOUNTER — Ambulatory Visit: Payer: Medicare Other

## 2016-06-05 DIAGNOSIS — Z8709 Personal history of other diseases of the respiratory system: Secondary | ICD-10-CM | POA: Insufficient documentation

## 2016-06-05 DIAGNOSIS — R042 Hemoptysis: Secondary | ICD-10-CM

## 2016-06-10 ENCOUNTER — Inpatient Hospital Stay
Admission: RE | Admit: 2016-06-10 | Discharge: 2016-06-10 | Disposition: A | Discharge status: Home / Self Care | Payer: Medicare Other | Source: Ambulatory Visit | Attending: Internal Medicine | Admitting: Internal Medicine

## 2016-06-10 VITALS — BP 120/64 | HR 83

## 2016-06-10 DIAGNOSIS — J449 Chronic obstructive pulmonary disease, unspecified: Secondary | ICD-10-CM

## 2016-06-10 HISTORY — DX: Chronic obstructive pulmonary disease, unspecified: J44.9

## 2016-06-10 HISTORY — DX: Malignant neoplasm of prostate: C61

## 2016-06-10 HISTORY — DX: Malignant (primary) neoplasm, unspecified: C80.1

## 2016-06-10 NOTE — Progress Notes (Signed)
Patient enrolled in Pulmonary Wellness per Dr. Truitt Leep. Patient and pt's daughter given instructions on pulmonary wellness program. Patient was instructed on exercise equipment and cleaning. Patient was able to verbalize and demonstrate exercise equipment cleaning. Patient departed on room air with oxygen saturation 98%

## 2016-06-17 ENCOUNTER — Inpatient Hospital Stay
Admission: RE | Admit: 2016-06-17 | Discharge: 2016-06-17 | Disposition: A | Discharge status: Home / Self Care | Payer: Medicare Other | Source: Ambulatory Visit | Attending: Internal Medicine | Admitting: Internal Medicine

## 2016-06-24 ENCOUNTER — Inpatient Hospital Stay
Admission: RE | Admit: 2016-06-24 | Discharge: 2016-06-24 | Disposition: A | Discharge status: Home / Self Care | Payer: Medicare Other | Source: Ambulatory Visit

## 2016-06-24 VITALS — BP 108/68 | HR 72

## 2016-06-24 DIAGNOSIS — J449 Chronic obstructive pulmonary disease, unspecified: Secondary | ICD-10-CM

## 2016-07-01 ENCOUNTER — Inpatient Hospital Stay
Admission: RE | Admit: 2016-07-01 | Discharge: 2016-07-01 | Disposition: A | Discharge status: Home / Self Care | Payer: Medicare Other | Source: Ambulatory Visit

## 2016-07-01 DIAGNOSIS — J449 Chronic obstructive pulmonary disease, unspecified: Secondary | ICD-10-CM

## 2016-07-08 ENCOUNTER — Inpatient Hospital Stay
Admission: RE | Admit: 2016-07-08 | Discharge: 2016-07-08 | Disposition: A | Discharge status: Home / Self Care | Payer: Medicare Other | Source: Ambulatory Visit

## 2016-07-08 DIAGNOSIS — J449 Chronic obstructive pulmonary disease, unspecified: Secondary | ICD-10-CM

## 2016-07-15 ENCOUNTER — Inpatient Hospital Stay
Admission: RE | Admit: 2016-07-15 | Discharge: 2016-07-15 | Disposition: A | Discharge status: Home / Self Care | Payer: Medicare Other | Source: Ambulatory Visit | Attending: Internal Medicine | Admitting: Internal Medicine

## 2016-07-15 DIAGNOSIS — J449 Chronic obstructive pulmonary disease, unspecified: Secondary | ICD-10-CM

## 2016-07-22 ENCOUNTER — Inpatient Hospital Stay
Admission: RE | Admit: 2016-07-22 | Discharge: 2016-07-22 | Disposition: A | Discharge status: Home / Self Care | Payer: Medicare Other | Source: Ambulatory Visit

## 2016-07-22 DIAGNOSIS — J449 Chronic obstructive pulmonary disease, unspecified: Secondary | ICD-10-CM

## 2016-07-29 ENCOUNTER — Inpatient Hospital Stay
Admission: RE | Admit: 2016-07-29 | Discharge: 2016-07-29 | Disposition: A | Discharge status: Home / Self Care | Payer: Medicare Other | Source: Ambulatory Visit

## 2016-07-29 ENCOUNTER — Ambulatory Visit: Payer: Medicare Other

## 2016-07-29 DIAGNOSIS — J449 Chronic obstructive pulmonary disease, unspecified: Secondary | ICD-10-CM

## 2016-08-05 ENCOUNTER — Ambulatory Visit: Payer: Medicare Other

## 2016-08-12 ENCOUNTER — Ambulatory Visit: Payer: Medicare Other

## 2016-08-19 ENCOUNTER — Inpatient Hospital Stay
Admission: RE | Admit: 2016-08-19 | Discharge: 2016-08-19 | Disposition: A | Discharge status: Home / Self Care | Payer: Medicare Other | Source: Ambulatory Visit | Attending: Internal Medicine | Admitting: Internal Medicine

## 2016-08-19 DIAGNOSIS — J449 Chronic obstructive pulmonary disease, unspecified: Secondary | ICD-10-CM

## 2016-08-26 ENCOUNTER — Inpatient Hospital Stay
Admission: RE | Admit: 2016-08-26 | Discharge: 2016-08-26 | Disposition: A | Discharge status: Home / Self Care | Payer: Medicare Other | Source: Ambulatory Visit

## 2016-08-26 DIAGNOSIS — J449 Chronic obstructive pulmonary disease, unspecified: Secondary | ICD-10-CM

## 2016-08-30 ENCOUNTER — Other Ambulatory Visit: Payer: Self-pay | Admitting: Pulmonary Disease

## 2016-08-30 DIAGNOSIS — R059 Cough, unspecified: Secondary | ICD-10-CM

## 2016-08-30 DIAGNOSIS — Z8709 Personal history of other diseases of the respiratory system: Secondary | ICD-10-CM

## 2016-09-02 ENCOUNTER — Inpatient Hospital Stay
Admission: RE | Admit: 2016-09-02 | Discharge: 2016-09-02 | Disposition: A | Discharge status: Home / Self Care | Payer: Medicare Other | Source: Ambulatory Visit

## 2016-09-04 ENCOUNTER — Other Ambulatory Visit: Payer: Self-pay | Admitting: Neurology

## 2016-09-04 DIAGNOSIS — R413 Other amnesia: Secondary | ICD-10-CM

## 2016-09-04 DIAGNOSIS — F0150 Vascular dementia without behavioral disturbance: Secondary | ICD-10-CM

## 2016-09-09 ENCOUNTER — Ambulatory Visit
Admission: RE | Admit: 2016-09-09 | Discharge: 2016-09-09 | Disposition: A | Discharge status: Home / Self Care | Payer: Medicare Other | Source: Ambulatory Visit | Attending: Pulmonary Disease | Admitting: Pulmonary Disease

## 2016-09-09 ENCOUNTER — Inpatient Hospital Stay
Admission: RE | Admit: 2016-09-09 | Discharge: 2016-09-09 | Disposition: A | Discharge status: Home / Self Care | Payer: Medicare Other | Source: Ambulatory Visit

## 2016-09-09 DIAGNOSIS — I059 Rheumatic mitral valve disease, unspecified: Secondary | ICD-10-CM | POA: Insufficient documentation

## 2016-09-09 DIAGNOSIS — J449 Chronic obstructive pulmonary disease, unspecified: Secondary | ICD-10-CM

## 2016-09-09 DIAGNOSIS — I2510 Atherosclerotic heart disease of native coronary artery without angina pectoris: Secondary | ICD-10-CM | POA: Insufficient documentation

## 2016-09-09 DIAGNOSIS — R918 Other nonspecific abnormal finding of lung field: Secondary | ICD-10-CM | POA: Insufficient documentation

## 2016-09-09 DIAGNOSIS — R590 Localized enlarged lymph nodes: Secondary | ICD-10-CM | POA: Insufficient documentation

## 2016-09-09 DIAGNOSIS — Z8709 Personal history of other diseases of the respiratory system: Secondary | ICD-10-CM | POA: Insufficient documentation

## 2016-09-09 DIAGNOSIS — R059 Cough, unspecified: Secondary | ICD-10-CM

## 2016-09-09 DIAGNOSIS — I2584 Coronary atherosclerosis due to calcified coronary lesion: Secondary | ICD-10-CM | POA: Insufficient documentation

## 2016-09-09 DIAGNOSIS — R05 Cough: Secondary | ICD-10-CM | POA: Insufficient documentation

## 2016-09-16 ENCOUNTER — Inpatient Hospital Stay
Admission: RE | Admit: 2016-09-16 | Discharge: 2016-09-16 | Disposition: A | Discharge status: Home / Self Care | Payer: Medicare Other | Source: Ambulatory Visit | Attending: Internal Medicine | Admitting: Internal Medicine

## 2016-09-16 DIAGNOSIS — J449 Chronic obstructive pulmonary disease, unspecified: Secondary | ICD-10-CM

## 2016-09-16 NOTE — Addendum Note (Signed)
Encounter addended by: Grahm Etsitty on: 09/16/2016  3:07 PM<BR>    Actions taken: Vitals modified

## 2016-09-20 ENCOUNTER — Ambulatory Visit
Admission: RE | Admit: 2016-09-20 | Discharge: 2016-09-20 | Disposition: A | Discharge status: Home / Self Care | Payer: Medicare Other | Source: Ambulatory Visit | Attending: Neurology | Admitting: Neurology

## 2016-09-20 DIAGNOSIS — R410 Disorientation, unspecified: Secondary | ICD-10-CM | POA: Insufficient documentation

## 2016-09-20 DIAGNOSIS — R220 Localized swelling, mass and lump, head: Secondary | ICD-10-CM | POA: Insufficient documentation

## 2016-09-20 DIAGNOSIS — F05 Delirium due to known physiological condition: Secondary | ICD-10-CM

## 2016-09-20 DIAGNOSIS — R413 Other amnesia: Secondary | ICD-10-CM

## 2016-09-20 DIAGNOSIS — G319 Degenerative disease of nervous system, unspecified: Secondary | ICD-10-CM | POA: Insufficient documentation

## 2016-09-20 DIAGNOSIS — I6782 Cerebral ischemia: Secondary | ICD-10-CM | POA: Insufficient documentation

## 2016-09-20 LAB — WHOLE BLOOD CREATININE WITH GFR POCT
GFR POCT: >60 mL/min/{1.73_m2} (ref >60)
Whole Blood Creatinine POCT: 0.8 mg/dL (ref 0.5–1.1)

## 2016-09-20 MED ORDER — GADOBUTROL 1 MMOL/ML IV SOLN
0.1000 mmol/kg | Freq: Once | INTRAVENOUS | Status: AC | PRN
Start: 2016-09-20 — End: 2016-09-20
  Administered 2016-09-20: 9.1 mmol via INTRAVENOUS
  Filled 2016-09-20: qty 10

## 2016-09-23 ENCOUNTER — Inpatient Hospital Stay
Admission: RE | Admit: 2016-09-23 | Discharge: 2016-09-23 | Disposition: A | Discharge status: Home / Self Care | Payer: Medicare Other | Source: Ambulatory Visit

## 2016-09-30 ENCOUNTER — Inpatient Hospital Stay
Admission: RE | Admit: 2016-09-30 | Discharge: 2016-09-30 | Disposition: A | Discharge status: Home / Self Care | Payer: Medicare Other | Source: Ambulatory Visit

## 2016-09-30 DIAGNOSIS — J449 Chronic obstructive pulmonary disease, unspecified: Secondary | ICD-10-CM

## 2016-10-07 ENCOUNTER — Inpatient Hospital Stay
Admission: RE | Admit: 2016-10-07 | Discharge: 2016-10-07 | Disposition: A | Discharge status: Home / Self Care | Payer: Medicare Other | Source: Ambulatory Visit

## 2016-10-07 DIAGNOSIS — J449 Chronic obstructive pulmonary disease, unspecified: Secondary | ICD-10-CM

## 2016-10-14 ENCOUNTER — Inpatient Hospital Stay
Admission: RE | Admit: 2016-10-14 | Discharge: 2016-10-14 | Disposition: A | Discharge status: Home / Self Care | Payer: Medicare Other | Source: Ambulatory Visit | Attending: Internal Medicine | Admitting: Internal Medicine

## 2016-10-14 DIAGNOSIS — J449 Chronic obstructive pulmonary disease, unspecified: Secondary | ICD-10-CM

## 2016-10-21 ENCOUNTER — Inpatient Hospital Stay
Admission: RE | Admit: 2016-10-21 | Discharge: 2016-10-21 | Disposition: A | Discharge status: Home / Self Care | Payer: Medicare Other | Source: Ambulatory Visit

## 2016-10-21 DIAGNOSIS — J449 Chronic obstructive pulmonary disease, unspecified: Secondary | ICD-10-CM

## 2016-10-28 ENCOUNTER — Inpatient Hospital Stay
Admission: RE | Admit: 2016-10-28 | Discharge: 2016-10-28 | Disposition: A | Discharge status: Home / Self Care | Payer: Medicare Other | Source: Ambulatory Visit

## 2016-10-28 DIAGNOSIS — J449 Chronic obstructive pulmonary disease, unspecified: Secondary | ICD-10-CM

## 2016-11-04 ENCOUNTER — Inpatient Hospital Stay
Admission: RE | Admit: 2016-11-04 | Discharge: 2016-11-04 | Disposition: A | Discharge status: Home / Self Care | Payer: Medicare Other | Source: Ambulatory Visit

## 2016-11-11 ENCOUNTER — Inpatient Hospital Stay
Admission: RE | Admit: 2016-11-11 | Discharge: 2016-11-11 | Disposition: A | Discharge status: Home / Self Care | Payer: Medicare Other | Source: Ambulatory Visit | Attending: Internal Medicine | Admitting: Internal Medicine

## 2016-11-11 DIAGNOSIS — J449 Chronic obstructive pulmonary disease, unspecified: Secondary | ICD-10-CM

## 2016-11-18 ENCOUNTER — Inpatient Hospital Stay
Admission: RE | Admit: 2016-11-18 | Discharge: 2016-11-18 | Disposition: A | Discharge status: Home / Self Care | Payer: Medicare Other | Source: Ambulatory Visit

## 2016-11-18 DIAGNOSIS — J449 Chronic obstructive pulmonary disease, unspecified: Secondary | ICD-10-CM

## 2016-11-25 ENCOUNTER — Inpatient Hospital Stay
Admission: RE | Admit: 2016-11-25 | Discharge: 2016-11-25 | Disposition: A | Discharge status: Home / Self Care | Payer: Medicare Other | Source: Ambulatory Visit

## 2016-11-25 DIAGNOSIS — J449 Chronic obstructive pulmonary disease, unspecified: Secondary | ICD-10-CM

## 2016-12-02 ENCOUNTER — Inpatient Hospital Stay
Admission: RE | Admit: 2016-12-02 | Discharge: 2016-12-02 | Disposition: A | Discharge status: Home / Self Care | Payer: Medicare Other | Source: Ambulatory Visit

## 2016-12-02 DIAGNOSIS — J449 Chronic obstructive pulmonary disease, unspecified: Secondary | ICD-10-CM

## 2016-12-09 ENCOUNTER — Inpatient Hospital Stay
Admission: RE | Admit: 2016-12-09 | Discharge: 2016-12-09 | Disposition: A | Discharge status: Home / Self Care | Payer: Medicare Other | Source: Ambulatory Visit

## 2016-12-09 DIAGNOSIS — J449 Chronic obstructive pulmonary disease, unspecified: Secondary | ICD-10-CM

## 2016-12-16 ENCOUNTER — Inpatient Hospital Stay
Admission: RE | Admit: 2016-12-16 | Discharge: 2016-12-16 | Disposition: A | Discharge status: Home / Self Care | Payer: Medicare Other | Source: Ambulatory Visit | Attending: Internal Medicine | Admitting: Internal Medicine

## 2016-12-16 DIAGNOSIS — J449 Chronic obstructive pulmonary disease, unspecified: Secondary | ICD-10-CM

## 2016-12-23 ENCOUNTER — Inpatient Hospital Stay
Admission: RE | Admit: 2016-12-23 | Discharge: 2016-12-23 | Disposition: A | Discharge status: Home / Self Care | Payer: Medicare Other | Source: Ambulatory Visit

## 2016-12-23 DIAGNOSIS — J449 Chronic obstructive pulmonary disease, unspecified: Secondary | ICD-10-CM

## 2016-12-30 ENCOUNTER — Inpatient Hospital Stay
Admission: RE | Admit: 2016-12-30 | Discharge: 2016-12-30 | Disposition: A | Discharge status: Home / Self Care | Payer: Medicare Other | Source: Ambulatory Visit

## 2016-12-30 DIAGNOSIS — J449 Chronic obstructive pulmonary disease, unspecified: Secondary | ICD-10-CM

## 2017-01-22 ENCOUNTER — Emergency Department
Admission: EM | Admit: 2017-01-22 | Discharge: 2017-01-22 | Disposition: A | Discharge status: Home / Self Care | Payer: Medicare Other | Attending: Emergency Medicine | Admitting: Emergency Medicine

## 2017-01-22 ENCOUNTER — Emergency Department: Payer: Medicare Other

## 2017-01-22 DIAGNOSIS — E785 Hyperlipidemia, unspecified: Secondary | ICD-10-CM | POA: Insufficient documentation

## 2017-01-22 DIAGNOSIS — M25332 Other instability, left wrist: Secondary | ICD-10-CM | POA: Insufficient documentation

## 2017-01-22 DIAGNOSIS — Z79899 Other long term (current) drug therapy: Secondary | ICD-10-CM | POA: Insufficient documentation

## 2017-01-22 DIAGNOSIS — Y9368 Activity, volleyball (beach) (court): Secondary | ICD-10-CM | POA: Insufficient documentation

## 2017-01-22 DIAGNOSIS — S86912A Strain of unspecified muscle(s) and tendon(s) at lower leg level, left leg, initial encounter: Secondary | ICD-10-CM | POA: Insufficient documentation

## 2017-01-22 DIAGNOSIS — W1830XA Fall on same level, unspecified, initial encounter: Secondary | ICD-10-CM | POA: Insufficient documentation

## 2017-01-22 HISTORY — DX: Pneumonia, unspecified organism: J18.9

## 2017-01-22 NOTE — ED Provider Notes (Signed)
Physician/Midlevel provider first contact with patient: 01/22/17 1904         History     Chief Complaint   Patient presents with   . Fall   . Leg Pain   . Hand Pain     Historian: Patient, daughter    Chief Complaint: Fall  Location: Left knee, left foot, left wrist  Timing: 10:30 AM  Severity: Moderate  Quality: Aching  Modifying Factors: Worse with walking  Associated signs and symptoms: No motor sensory deficit, neck or back pain  Context:      HPI:  A 83-year-old male presenting status post fall while playing volleyball this morning.  No head injury.  No loss of consciousness.  No neck or back pain.  Reports pain in the left wrist, left knee, left foot.  Patient has been ambulatory since the accident.      Nursing (triage) note reviewed for the following pertinent information:  Fall    Past Medical History:   Diagnosis Date   . Chronic obstructive pulmonary disease    . Hyperlipemia    . Malignant neoplasm    . Pneumonia    . Prostate cancer        Past Surgical History:   Procedure Laterality Date   . BLADDER SURGERY     . CATARACT EXTRACTION         History reviewed. No pertinent family history.    Social  Social History   Substance Use Topics   . Smoking status: Former Smoker     Years: 69.00     Types: Pipe   . Smokeless tobacco: Former Neurosurgeon   . Alcohol use Yes      Comment: Rarely       .     No Known Allergies    Home Medications     Med List Status:  In Progress Set By: Lennon Alstrom, RN at 01/22/2017  6:33 PM                acetaminophen (TYLENOL ARTHRITIS PAIN) 650 MG CR tablet     Take 650 mg by mouth every 8 (eight) hours as needed for Pain.     atorvastatin (LIPITOR) 10 MG tablet     Take 10 mg by mouth daily.     cetirizine (ZYRTEC) 10 MG tablet     Take 10 mg by mouth daily.     donepezil (ARICEPT) 5 MG tablet     Take 5 mg by mouth nightly.     metoprolol succinate XL (TOPROL-XL) 25 MG 24 hr tablet     Take 12.5 mg by mouth daily.     Multiple Vitamins-Minerals (CENTRUM SILVER PO)      Take by mouth.     Multiple Vitamins-Minerals (PRESERVISION AREDS 2 PO)     Take 1 capsule by mouth 2 (two) times daily.     solifenacin (VESICARE) 5 MG tablet     Take 10 mg by mouth daily.           Flagged for Removal             furosemide (LASIX) 20 MG tablet     Take 20 mg by mouth every other day.           Review of Systems   Constitutional: Negative for chills and fever.   Respiratory: Negative for cough and shortness of breath.    Musculoskeletal: Negative for back pain and neck pain.   Neurological:  Negative for weakness and numbness.   All other systems reviewed and are negative.      Physical Exam    BP: 155/81, Heart Rate: 81, Temp: 98 F (36.7 C), Resp Rate: 17, SpO2: 95 %, Weight: 94.8 kg     Physical Exam   Constitutional: He is oriented to person, place, and time. He appears well-developed and well-nourished.   HENT:   Head: Normocephalic and atraumatic.   Eyes: Conjunctivae and EOM are normal. Right eye exhibits no discharge. Left eye exhibits no discharge. No scleral icterus.   Neck: No JVD present. No tracheal deviation present.   Cardiovascular: Normal rate, regular rhythm and normal heart sounds.  Exam reveals no gallop and no friction rub.    No murmur heard.  Pulmonary/Chest: Effort normal and breath sounds normal. No stridor. No respiratory distress. He has no wheezes. He has no rales.   Abdominal: Soft. Bowel sounds are normal. He exhibits no distension and no mass. There is no tenderness. There is no rebound and no guarding.   Musculoskeletal: Normal range of motion. He exhibits no edema.        Right shoulder: He exhibits no bony tenderness.        Left shoulder: He exhibits no bony tenderness.        Right elbow: No tenderness found.        Left elbow: No tenderness found.        Right wrist: He exhibits no bony tenderness.        Left wrist: He exhibits bony tenderness. He exhibits no swelling and no deformity.        Right hip: He exhibits no bony tenderness.        Left hip: He  exhibits no bony tenderness.        Right knee: No tenderness found.        Left knee: He exhibits normal range of motion. Tenderness found.        Right ankle: No tenderness.        Left ankle: No tenderness.        Cervical back: He exhibits no bony tenderness.        Arms:       Left foot: There is bony tenderness.        Feet:    Neurological: He is alert and oriented to person, place, and time. He has normal strength. No sensory deficit. GCS eye subscore is 4. GCS verbal subscore is 5. GCS motor subscore is 6.   Skin: Skin is warm, dry and intact.   Psychiatric: He has a normal mood and affect. His speech is normal and behavior is normal. Judgment and thought content normal.         MDM and ED Course     ED Medication Orders     None             MDM  Number of Diagnoses or Management Options  Knee strain, left, initial encounter:   Scapholunate instability of left wrist:   Diagnosis management comments: I, Earline Mayotte MD, have been the primary provider for Brian Trevino during this Emergency Dept visit.      DDx includes, but is not limited to: Contusion, fracture, strain    Initial Plan: X-ray left wrist, left knee, left foot, splint, orthopedics follow-up        Results discussed.  Understands need to follow-up regarding his findings on left wrist x-ray with orthopedics.  Results     ** No results found for the last 24 hours. **        Radiology Results (24 Hour)     Procedure Component Value Units Date/Time    Foot Left AP Lateral And Oblique [[id number]] Collected:  01/22/17 2025    Order Status:  Completed Updated:  01/22/17 2032    Narrative:       Clinical history: Patient fell. Proximal pain.    Findings: AP, lateral, and oblique views of the left foot.     Degenerative changes along multiple partially visualized interphalangeal  joints. Prior osteotomy along the head of the fifth proximal phalanx.  Tiny plantar calcaneal spur. No gross fracture or dislocation is  identified. However, evaluation  of phalanges is significantly limited  due to multiple hammertoe deformities and evaluation for nondisplaced  fractures is limited due to marked bony demineralization.      Impression:        No evidence of displaced fracture. Please see above.    Darra Lis, MD   01/22/2017 8:27 PM    Wrist Left PA Lateral and Oblique [[id number]] Collected:  01/22/17 2021    Order Status:  Completed Updated:  01/22/17 2029    Narrative:       Clinical History: Patient fell. Pain.    Findings: PA, lateral, scaphoid, and oblique views of the left wrist.     Mild widening of the scapholunate interval measuring 3 mm. Mild  degenerative changes lung articulation of the scaphoid and trapezium  bones. Prominent osteoarthritic changes along the partially visualized  fifth PIP joint. Bone mineralization appears decreased. No fracture is  identified. 2 small radiopaque densities in the distal thenar soft  tissue region measuring up to 2 mm in size could represent foreign  bodies. Differential diagnosis includes dense calcifications.      Impression:          1. Mild widening of the scapholunate interval. Scapholunate ligament  tear should be excluded. No evidence of fracture.  2. Possible tiny radiopaque foreign bodies in the distal thenar soft  tissues.    Darra Lis, MD   01/22/2017 8:25 PM    Knee Left AP And Lateral (Limited) [[id number]] Collected:  01/22/17 2020    Order Status:  Completed Updated:  01/22/17 2025    Narrative:       Clinical History: Patient fell. Pain.    Findings: AP and lateral views of the left knee.     Mild spurring along the lateral tibial spine. Tiny patellofemoral  compartment osteophytes. Small suprapatellar knee effusion. Bone  mineralization appears decreased. Multifocal atherosclerotic  calcifications. No fracture or dislocation is identified.      Impression:        Minimal osteoarthritis and small knee effusion.    Darra Lis, MD   01/22/2017 8:21 PM                      Procedures    Clinical Impression & Disposition     Clinical Impression  Final diagnoses:   Scapholunate instability of left wrist   Knee strain, left, initial encounter        ED Disposition     ED Disposition Condition Date/Time Comment    Discharge  Wed Jan 22, 2017  9:02 PM Brian Trevino discharge to home/self care.    Condition at disposition: Stable           New Prescriptions  No medications on file                 Earline Mayotte, MD  01/22/17 2109

## 2017-01-22 NOTE — ED Triage Notes (Signed)
Patient was playing volleyball when he went to go for a ball and tripped.  Onset at 1030 this morning, now complains of left hand and left leg pain.  Able to ambulate but it is painful.  Notes bruising to the hand and a lump to left lower leg.  Denies CP/TB.  No weapons noted.

## 2017-01-22 NOTE — Discharge Instructions (Signed)
Tendon/Ligament Tear/Rupture    You have been seen for a tendon and/or ligament tear or rupture.    Tendons connect muscles to bones. Ligaments connect bones to each other. Both are types of connective tissue in the body.     A sprain is an injury to a ligament (a type of connective tissue). It is usually a tear or partial tear. Sprains can be as painful as broken bones. There are different degrees of injury. A first-degree sprain is a minor tear. With a third-degree sprain, there is often a chip in the bone torn away where the ligament attaches.    A strain is a when a tendon or muscle is pulled or torn. A strain may mean the muscle itself is torn. The tendon may be torn where it meets the muscle or the tendon has been pulled off the bone.     Sprains and strains occur when a part of the body is put under unusual stress. This can happen suddenly. It can also happen over time, as with repeated motions. Both sprains and strains can cause pain and swelling. The body will also try to protect the injured part. To do this, it will use other body parts when possible.     Sprains and strains are treated with medicine to lower pain, and sometimes splints to reduce movement. They are also treated with Resting, Icing, Compressing and Elevating the injured area. Remember this as "RICE."   REST: Limit the use of the injured body part.   ICE: By applying ice to the affected area, swelling and pain can be reduced. Place some ice cubes in a re-sealable (Ziploc) bag and add some water. Put a thin washcloth between the bag and the skin. Apply the ice bag to the area for at least 20 minutes. Do this at least 4 times per day. It is okay to do this more often than directed. You can also do it for longer than directed. NEVER APPLY ICE DIRECTLY TO THE SKIN.   COMPRESS: Compression means to apply pressure around the injured area such as with a splint, cast or an ACE bandage. Compression decreases swelling and improves comfort.  Compression should be tight enough to relieve swelling but not so tight as to decrease circulation. Increasing pain, numbness, tingling, or change in skin color, are all signs of decreased circulation.   ELEVATE: Elevate the injured part. You can prop an injured leg on a chair while sitting, or on pillows while lying down.    You have been given a splint or a sling to wear. The splint or sling will help support the injured part. It will also keep it from moving. Use the splint or sling:   Until you follow up with a doctor.    You may take off the splint or sling when you sleep and bathe. You can also take it off during the day when you are resting.     You may have been referred to an orthopedist (bone and joint doctor). Follow up as instructed.     YOU SHOULD SEEK MEDICAL ATTENTION IMMEDIATELY, EITHER HERE OR AT THE NEAREST EMERGENCY DEPARTMENT, IF ANY OF THE FOLLOWING OCCURS:   You experience a severe increase in pain.   You have new numbness or tingling in or below the affected area.   Your foot is cold or pale. This could mean it is having problems with its blood supply.  Knee Immobilizer Instructions    You have been given a knee immobilizer.    Wear the immobilizer on your knee as directed to lower pain and keep the knee joint from moving. The splint does the job of the knee ligaments and supports the joint when it is injured. If the splint is used continuously for more than 7-10 days, however, the thigh muscles may atrophy (become weak). Use the splint:   Until follow-up.    The splint can be taken off for sleeping and bathing and a few times during the day when you are not walking. Bend the knee a few times to help with stiffness.    YOU SHOULD SEEK MEDICAL ATTENTION IMMEDIATELY, EITHER HERE OR AT THE NEAREST EMERGENCY DEPARTMENT, IF ANY OF THE FOLLOWING OCCURS:   Severe (serious) pain increase in the affected area.   New numbness or tingling in or below the affected area.   Your  foot gets cold or pale or seems to have blood supply problems.   A large amount of leg or calf swelling, shortness of breath or chest pain.

## 2017-10-20 ENCOUNTER — Encounter: Payer: Self-pay | Admitting: Urology

## 2017-12-22 ENCOUNTER — Observation Stay (HOSPITAL_COMMUNITY): Payer: Medicare Other

## 2017-12-22 ENCOUNTER — Observation Stay (HOSPITAL_COMMUNITY)
Admission: EM | Admit: 2017-12-22 | Discharge: 2017-12-23 | Disposition: A | Discharge status: Home / Self Care | Payer: Medicare Other | Attending: Internal Medicine | Admitting: Internal Medicine

## 2017-12-22 ENCOUNTER — Emergency Department (HOSPITAL_COMMUNITY): Payer: Medicare Other

## 2017-12-22 ENCOUNTER — Encounter (HOSPITAL_COMMUNITY): Payer: Self-pay | Admitting: Neurology

## 2017-12-22 DIAGNOSIS — Z79899 Other long term (current) drug therapy: Secondary | ICD-10-CM | POA: Insufficient documentation

## 2017-12-22 DIAGNOSIS — I10 Essential (primary) hypertension: Secondary | ICD-10-CM | POA: Diagnosis not present

## 2017-12-22 DIAGNOSIS — R29898 Other symptoms and signs involving the musculoskeletal system: Secondary | ICD-10-CM | POA: Insufficient documentation

## 2017-12-22 DIAGNOSIS — E7800 Pure hypercholesterolemia, unspecified: Secondary | ICD-10-CM | POA: Diagnosis present

## 2017-12-22 DIAGNOSIS — E236 Other disorders of pituitary gland: Secondary | ICD-10-CM | POA: Diagnosis present

## 2017-12-22 DIAGNOSIS — F0390 Unspecified dementia without behavioral disturbance: Secondary | ICD-10-CM | POA: Diagnosis not present

## 2017-12-22 DIAGNOSIS — R4701 Aphasia: Secondary | ICD-10-CM | POA: Diagnosis present

## 2017-12-22 DIAGNOSIS — N3281 Overactive bladder: Secondary | ICD-10-CM | POA: Diagnosis present

## 2017-12-22 DIAGNOSIS — G459 Transient cerebral ischemic attack, unspecified: Secondary | ICD-10-CM | POA: Diagnosis not present

## 2017-12-22 DIAGNOSIS — R2990 Unspecified symptoms and signs involving the nervous system: Secondary | ICD-10-CM | POA: Insufficient documentation

## 2017-12-22 HISTORY — DX: Essential (primary) hypertension: I10

## 2017-12-22 HISTORY — DX: Overactive bladder: N32.81

## 2017-12-22 HISTORY — DX: Other disorders of pituitary gland: E23.6

## 2017-12-22 HISTORY — DX: Pure hypercholesterolemia, unspecified: E78.00

## 2017-12-22 LAB — URINALYSIS, ROUTINE W REFLEX MICROSCOPIC
BILIRUBIN URINE: NEGATIVE
Glucose, UA: NEGATIVE mg/dL
HGB URINE DIPSTICK: NEGATIVE
KETONES UR: NEGATIVE mg/dL
Leukocytes, UA: NEGATIVE
NITRITE: NEGATIVE
PROTEIN: NEGATIVE mg/dL
Specific Gravity, Urine: 1.029 (ref 1.005–1.030)
pH: 6 (ref 5.0–8.0)

## 2017-12-22 LAB — DIFFERENTIAL
BASOS ABS: 0.1 10*3/uL (ref 0.0–0.1)
BASOS PCT: 1 %
Eosinophils Absolute: 0.3 10*3/uL (ref 0.0–0.7)
Eosinophils Relative: 4 %
LYMPHS PCT: 37 %
Lymphs Abs: 2.7 10*3/uL (ref 0.7–4.0)
MONO ABS: 0.7 10*3/uL (ref 0.1–1.0)
MONOS PCT: 9 %
NEUTROS ABS: 3.8 10*3/uL (ref 1.7–7.7)
Neutrophils Relative %: 49 %

## 2017-12-22 LAB — PROTIME-INR
INR: 1
Prothrombin Time: 13.1 seconds (ref 11.4–15.2)

## 2017-12-22 LAB — COMPREHENSIVE METABOLIC PANEL
ALK PHOS: 77 U/L (ref 38–126)
ALT: 20 U/L (ref 17–63)
AST: 29 U/L (ref 15–41)
Albumin: 3.4 g/dL — ABNORMAL LOW (ref 3.5–5.0)
Anion gap: 9 (ref 5–15)
BUN: 25 mg/dL — ABNORMAL HIGH (ref 6–20)
CALCIUM: 9.1 mg/dL (ref 8.9–10.3)
CHLORIDE: 104 mmol/L (ref 101–111)
CO2: 23 mmol/L (ref 22–32)
CREATININE: 1.01 mg/dL (ref 0.61–1.24)
Glucose, Bld: 117 mg/dL — ABNORMAL HIGH (ref 65–99)
Potassium: 4.1 mmol/L (ref 3.5–5.1)
SODIUM: 136 mmol/L (ref 135–145)
Total Bilirubin: 0.3 mg/dL (ref 0.3–1.2)
Total Protein: 6.9 g/dL (ref 6.5–8.1)

## 2017-12-22 LAB — ETHANOL: Alcohol, Ethyl (B): <10 mg/dL (ref <10)

## 2017-12-22 LAB — CBC
HEMATOCRIT: 37.7 % — AB (ref 39.0–52.0)
Hemoglobin: 12.8 g/dL — ABNORMAL LOW (ref 13.0–17.0)
MCH: 31.1 pg (ref 26.0–34.0)
MCHC: 34 g/dL (ref 30.0–36.0)
MCV: 91.5 fL (ref 78.0–100.0)
Platelets: 198 10*3/uL (ref 150–400)
RBC: 4.12 MIL/uL — ABNORMAL LOW (ref 4.22–5.81)
RDW: 15.4 % (ref 11.5–15.5)
WBC: 7.5 10*3/uL (ref 4.0–10.5)

## 2017-12-22 LAB — CORTISOL: CORTISOL PLASMA: 6.3 ug/dL

## 2017-12-22 LAB — I-STAT CHEM 8, ED
BUN: 27 mg/dL — ABNORMAL HIGH (ref 6–20)
CALCIUM ION: 1.16 mmol/L (ref 1.15–1.40)
CHLORIDE: 103 mmol/L (ref 101–111)
CREATININE: 0.9 mg/dL (ref 0.61–1.24)
GLUCOSE: 115 mg/dL — AB (ref 65–99)
HCT: 37 % — ABNORMAL LOW (ref 39.0–52.0)
Hemoglobin: 12.6 g/dL — ABNORMAL LOW (ref 13.0–17.0)
POTASSIUM: 4.1 mmol/L (ref 3.5–5.1)
Sodium: 138 mmol/L (ref 135–145)
TCO2: 24 mmol/L (ref 22–32)

## 2017-12-22 LAB — I-STAT TROPONIN, ED: Troponin i, poc: 0 ng/mL (ref 0.00–0.08)

## 2017-12-22 LAB — LIPID PANEL
CHOLESTEROL: 159 mg/dL (ref 0–200)
HDL: 44 mg/dL (ref >40)
LDL Cholesterol: 58 mg/dL (ref 0–99)
TRIGLYCERIDES: 284 mg/dL — AB (ref <150)
Total CHOL/HDL Ratio: 3.6 RATIO
VLDL: 57 mg/dL — ABNORMAL HIGH (ref 0–40)

## 2017-12-22 LAB — CBG MONITORING, ED: GLUCOSE-CAPILLARY: 111 mg/dL — AB (ref 65–99)

## 2017-12-22 LAB — HEMOGLOBIN A1C
HEMOGLOBIN A1C: 5.6 % (ref 4.8–5.6)
MEAN PLASMA GLUCOSE: 114.02 mg/dL

## 2017-12-22 LAB — RAPID URINE DRUG SCREEN, HOSP PERFORMED
AMPHETAMINES: NOT DETECTED
BENZODIAZEPINES: NOT DETECTED
Barbiturates: NOT DETECTED
Cocaine: NOT DETECTED
Opiates: NOT DETECTED
TETRAHYDROCANNABINOL: NOT DETECTED

## 2017-12-22 LAB — APTT: APTT: 33 s (ref 24–36)

## 2017-12-22 LAB — TSH: TSH: 2.493 u[IU]/mL (ref 0.350–4.500)

## 2017-12-22 MED ORDER — TRAZODONE HCL 50 MG PO TABS
50.0000 mg | ORAL_TABLET | Freq: Every day | ORAL | Status: DC
  Administered 2017-12-22: 50 mg via ORAL
  Filled 2017-12-22: qty 1

## 2017-12-22 MED ORDER — IOPAMIDOL (ISOVUE-370) INJECTION 76%
INTRAVENOUS | Status: AC
  Filled 2017-12-22: qty 50

## 2017-12-22 MED ORDER — MIRABEGRON ER 50 MG PO TB24
50.0000 mg | ORAL_TABLET | Freq: Every day | ORAL | Status: DC
  Administered 2017-12-23: 50 mg via ORAL
  Filled 2017-12-22: qty 1

## 2017-12-22 MED ORDER — ACETAMINOPHEN 650 MG RE SUPP: 650.0000 mg | RECTAL | Status: DC | PRN

## 2017-12-22 MED ORDER — IOPAMIDOL (ISOVUE-370) INJECTION 76%
50.0000 mL | Freq: Once | INTRAVENOUS | Status: AC | PRN
  Administered 2017-12-22: 50 mL via INTRAVENOUS

## 2017-12-22 MED ORDER — SODIUM CHLORIDE 0.9 % IV SOLN: INTRAVENOUS | Status: AC

## 2017-12-22 MED ORDER — ATORVASTATIN CALCIUM 10 MG PO TABS: 10.0000 mg | ORAL_TABLET | Freq: Every day | ORAL | Status: DC

## 2017-12-22 MED ORDER — ATORVASTATIN CALCIUM 80 MG PO TABS
80.0000 mg | ORAL_TABLET | Freq: Every day | ORAL | Status: DC
  Administered 2017-12-22: 80 mg via ORAL
  Filled 2017-12-22: qty 1

## 2017-12-22 MED ORDER — ENOXAPARIN SODIUM 40 MG/0.4ML ~~LOC~~ SOLN
40.0000 mg | SUBCUTANEOUS | Status: DC
  Administered 2017-12-22: 40 mg via SUBCUTANEOUS
  Filled 2017-12-22: qty 0.4

## 2017-12-22 MED ORDER — ASPIRIN EC 325 MG PO TBEC
325.0000 mg | DELAYED_RELEASE_TABLET | Freq: Once | ORAL | Status: AC
Start: 2017-12-22 — End: 2017-12-22
  Administered 2017-12-22: 325 mg via ORAL
  Filled 2017-12-22: qty 1

## 2017-12-22 MED ORDER — SODIUM CHLORIDE 0.9 % IV SOLN: INTRAVENOUS | Status: DC

## 2017-12-22 MED ORDER — DONEPEZIL HCL 10 MG PO TABS
10.0000 mg | ORAL_TABLET | Freq: Every day | ORAL | Status: DC
  Administered 2017-12-22: 10 mg via ORAL
  Filled 2017-12-22 (×2): qty 1

## 2017-12-22 MED ORDER — SENNOSIDES-DOCUSATE SODIUM 8.6-50 MG PO TABS: 1.0000 | ORAL_TABLET | Freq: Every evening | ORAL | Status: DC | PRN

## 2017-12-22 MED ORDER — STROKE: EARLY STAGES OF RECOVERY BOOK
Freq: Once | Status: AC
  Administered 2017-12-22: 19:00:00
  Filled 2017-12-22: qty 1

## 2017-12-22 MED ORDER — ACETAMINOPHEN 160 MG/5ML PO SOLN: 650.0000 mg | ORAL | Status: DC | PRN

## 2017-12-22 MED ORDER — ACETAMINOPHEN 325 MG PO TABS: 650.0000 mg | ORAL_TABLET | ORAL | Status: DC | PRN

## 2017-12-22 NOTE — H&P (Addendum)
History and Physical    Billy [name] GMW:[id number] DOB: 1927/01/29 DOA: 12/22/2017  PCP: No primary care provider on file. Patient coming from: assisted living  Chief Complaint: facial droop/slurred speech  HPI: Billy [name] is a delightful 84 y.o. male with medical history significant hypertension, mild dementia, overactive bladder presents to the emergency Department chief complaint facial droop and slurred speech. Code stroke was called and patient evaluated by neurology who opined TIA and recommended admission with stroke workup. [facility] asked to admit  Information is obtained from the patient and the chart noting that information from patient may be unreliable secondary to mild dementia. Patient states he was walking down the hall on his way to lunch he developed "difficulty speaking". He was aware he was not making sense and understood that people around him were concerned. Reportedly during this time he also had a facial droop. He denies any headache blurred vision dizziness syncope or near-syncope. He denies any chest pain palpitations shortness of breath difficulty chewing or swallowing. He denies abdominal pain nausea vomiting lower extremity edema. He does report a chronic cough that is nonproductive. He denies dysuria hematuria frequency or urgency.     ED Course: n the emergency department he's afebrile hemodynamically stable and not hypoxic. His symptoms resolved upon arrival to the emergency department. He was evaluated by neurology and deemed not a candidate for TPA.  Review of Systems: As per HPI otherwise all other systems reviewed and are negative.   Ambulatory Status: ambulates with a cane has had no recent falls independent with ADLs  Past Medical History:  Diagnosis Date  . Hypercholesterolemia   . Hypertension   . Overactive bladder     History reviewed. No pertinent surgical history.  Social History   Socioeconomic History  . Marital status:  Widowed    Spouse name: Not on file  . Number of children: Not on file  . Years of education: Not on file  . Highest education level: Not on file  Occupational History  . Not on file  Social Needs  . Financial resource strain: Not on file  . Food insecurity:    Worry: Not on file    Inability: Not on file  . Transportation needs:    Medical: Not on file    Non-medical: Not on file  Tobacco Use  . Smoking status: Not on file  Substance and Sexual Activity  . Alcohol use: Not on file  . Drug use: Not on file  . Sexual activity: Not on file  Lifestyle  . Physical activity:    Days per week: Not on file    Minutes per session: Not on file  . Stress: Not on file  Relationships  . Social connections:    Talks on phone: Not on file    Gets together: Not on file    Attends religious service: Not on file    Active member of club or organization: Not on file    Attends meetings of clubs or organizations: Not on file    Relationship status: Not on file  . Intimate partner violence:    Fear of current or ex partner: Not on file    Emotionally abused: Not on file    Physically abused: Not on file    Forced sexual activity: Not on file  Other Topics Concern  . Not on file  Social History Narrative  . Not on file    No Known Allergies  Family History  Problem Relation Age  of Onset  . Hypertension Mother   . Hypertension Father     Prior to Admission medications   Medication Sig Start Date End Date Taking? Authorizing Provider  acetaminophen (TYLENOL) 650 MG CR tablet Take 650 mg by mouth daily.   Yes [provider]  atorvastatin (LIPITOR) 10 MG tablet Take 10 mg by mouth at bedtime.   Yes [provider]  donepezil (ARICEPT) 10 MG tablet Take 10 mg by mouth at bedtime.   Yes [provider]  metoprolol tartrate (LOPRESSOR) 25 MG tablet Take 12.5 mg by mouth daily.   Yes [provider]  mirabegron ER (MYRBETRIQ) 50 MG TB24 tablet Take  50 mg by mouth daily.   Yes [provider]  Multiple Vitamins-Minerals (CENTRUM SILVER 50+MEN PO) Take 1 tablet by mouth daily.   Yes [provider]  Multiple Vitamins-Minerals (PRESERVISION AREDS 2+MULTI VIT PO) Take 1 capsule by mouth 2 (two) times daily.   Yes [provider]  traZODone (DESYREL) 50 MG tablet Take 50 mg by mouth at bedtime.   Yes [provider]    Physical Exam: Vitals:   12/22/17 1545 12/22/17 1615 12/22/17 1700 12/22/17 1730  BP: 132/71 (!) 157/74 133/66 (!) 153/76  Pulse: 73 76 64 67  Resp:  15 17 (!) 21  Temp:      TempSrc:      SpO2: 98% 94% 95%      General:  Appears calm and comfortable in no acute distress Eyes:  PERRL, EOMI, normal lids, iris ENT:  grossly normal hearing, lips & tongue, his membranes of his mouth are moist and pink Neck:  no LAD, masses or thyromegaly Cardiovascular:  RRR, no m/r/g. No LE edema.  Respiratory:  CTA bilaterally, no w/r/r. Normal respiratory effort. Abdomen:  soft, ntnd, NABS Skin:  no rash or induration seen on limited exam Musculoskeletal:  grossly normal tone BUE/BLE, good ROM, no bony abnormality Psychiatric:  grossly normal mood and affect, speech fluent and appropriate, AOx3 Neurologic:  CN 2-12 grossly intact, moves all extremities in coordinated fashion, sensation intact speech clear facial symmetry bilateral grip 5 out of 5 lower extremity strength 5 out of 5 tongue midline  Labs on Admission: I have personally reviewed following labs and imaging studies  CBC: Recent Labs  Lab 12/22/17 1515 12/22/17 1518  WBC 7.5  --   NEUTROABS 3.8  --   HGB 12.8* 12.6*  HCT 37.7* 37.0*  MCV 91.5  --   PLT 198  --    Basic Metabolic Panel: Recent Labs  Lab 12/22/17 1515 12/22/17 1518  NA 136 138  K 4.1 4.1  CL 104 103  CO2 23  --   GLUCOSE 117* 115*  BUN 25* 27*  CREATININE 1.01 0.90  CALCIUM 9.1  --    GFR: CrCl cannot be calculated (Unknown ideal weight.). Liver  Function Tests: Recent Labs  Lab 12/22/17 1515  AST 29  ALT 20  ALKPHOS 77  BILITOT 0.3  PROT 6.9  ALBUMIN 3.4*   No results for input(s): LIPASE, AMYLASE in the last 168 hours. No results for input(s): AMMONIA in the last 168 hours. Coagulation Profile: Recent Labs  Lab 12/22/17 1515  INR 1.00   Cardiac Enzymes: No results for input(s): CKTOTAL, CKMB, CKMBINDEX, TROPONINI in the last 168 hours. BNP (last 3 results) No results for input(s): PROBNP in the last 8760 hours. HbA1C: No results for input(s): HGBA1C in the last 72 hours. CBG: Recent Labs  Lab  12/22/17 1512  GLUCAP 111*   Lipid Profile: No results for input(s): CHOL, HDL, LDLCALC, TRIG, CHOLHDL, LDLDIRECT in the last 72 hours. Thyroid Function Tests: No results for input(s): TSH, T4TOTAL, FREET4, T3FREE, THYROIDAB in the last 72 hours. Anemia Panel: No results for input(s): VITAMINB12, FOLATE, FERRITIN, TIBC, IRON, RETICCTPCT in the last 72 hours. Urine analysis:    Component Value Date/Time   COLORURINE YELLOW 12/22/2017 1616   APPEARANCEUR CLEAR 12/22/2017 1616   LABSPEC 1.029 12/22/2017 1616   PHURINE 6.0 12/22/2017 1616   GLUCOSEU NEGATIVE 12/22/2017 1616   HGBUR NEGATIVE 12/22/2017 1616   BILIRUBINUR NEGATIVE 12/22/2017 1616   KETONESUR NEGATIVE 12/22/2017 1616   PROTEINUR NEGATIVE 12/22/2017 1616   NITRITE NEGATIVE 12/22/2017 1616   LEUKOCYTESUR NEGATIVE 12/22/2017 1616    Creatinine Clearance: CrCl cannot be calculated (Unknown ideal weight.).  Sepsis Labs: @LABRCNTIP (procalcitonin:4,lacticidven:4) )No results found for this or any previous visit (from the past 240 hour(s)).   Radiological Exams on Admission: Ct Angio Head W Or Wo Contrast  Result Date: 12/22/2017 CLINICAL DATA:  Right-sided facial droop. EXAM: CT ANGIOGRAPHY HEAD AND NECK TECHNIQUE: Multidetector CT imaging of the head and neck was performed using the standard protocol during bolus administration of intravenous contrast.  Multiplanar CT image reconstructions and MIPs were obtained to evaluate the vascular anatomy. Carotid stenosis measurements (when applicable) are obtained utilizing NASCET criteria, using the distal internal carotid diameter as the denominator. CONTRAST:  38mL ISOVUE-370 IOPAMIDOL (ISOVUE-370) INJECTION 76% COMPARISON:  CT earlier same day FINDINGS: CTA NECK FINDINGS Aortic arch: The aortic arch shows atherosclerotic calcification. Branching pattern of the brachiocephalic vessels from the arch is normal. No origin stenosis. Right carotid system: Common carotid artery widely patent to the bifurcation. Mild calcified plaque at the carotid bifurcation but no stenosis or irregularity. Cervical ICA widely patent. Left carotid system: Common carotid artery widely patent to the bifurcation. Minimal atherosclerotic plaque at the carotid bifurcation but no stenosis or irregularity. Cervical ICA is tortuous but widely patent. Vertebral arteries: Both vertebral arteries are approximately equal in size. The origins are widely patent. No stenotic disease in the cervical region. Skeleton: Ordinary mild midcervical spondylosis. Other neck: No mass or lymphadenopathy. Upper chest: Incidental azygos fissure on the right. Mild pulmonary scarring. No active process evident. Review of the MIP images confirms the above findings CTA HEAD FINDINGS Anterior circulation: Both internal carotid arteries are patent through the skull base and siphon regions. There is atherosclerotic calcification in the carotid siphon regions but no stenosis greater than about 30%. The anterior and middle cerebral vessels are patent without proximal stenosis, aneurysm or vascular malformation. Posterior circulation: Both vertebral arteries are widely patent to the basilar. No basilar stenosis. Posterior circulation branch vessels are normal. Venous sinuses: Patent and normal. Anatomic variants: None significant Delayed phase: 2 cm pituitary macro adenoma egg  and demonstrated. Review of the MIP images confirms the above findings IMPRESSION: No significant vascular finding. Ordinary atherosclerosis. No flow limiting stenosis or significant irregularity. No large or medium vessel occlusion. Redemonstration of 2 cm pituitary macro adenoma. Electronically Signed   By: Nelson Chimes M.D.   On: 12/22/2017 15:45   Ct Angio Neck W Or Wo Contrast  Result Date: 12/22/2017 CLINICAL DATA:  Right-sided facial droop. EXAM: CT ANGIOGRAPHY HEAD AND NECK TECHNIQUE: Multidetector CT imaging of the head and neck was performed using the standard protocol during bolus administration of intravenous contrast. Multiplanar CT image reconstructions and MIPs were obtained to evaluate the vascular anatomy. Carotid stenosis measurements (  when applicable) are obtained utilizing NASCET criteria, using the distal internal carotid diameter as the denominator. CONTRAST:  37mL ISOVUE-370 IOPAMIDOL (ISOVUE-370) INJECTION 76% COMPARISON:  CT earlier same day FINDINGS: CTA NECK FINDINGS Aortic arch: The aortic arch shows atherosclerotic calcification. Branching pattern of the brachiocephalic vessels from the arch is normal. No origin stenosis. Right carotid system: Common carotid artery widely patent to the bifurcation. Mild calcified plaque at the carotid bifurcation but no stenosis or irregularity. Cervical ICA widely patent. Left carotid system: Common carotid artery widely patent to the bifurcation. Minimal atherosclerotic plaque at the carotid bifurcation but no stenosis or irregularity. Cervical ICA is tortuous but widely patent. Vertebral arteries: Both vertebral arteries are approximately equal in size. The origins are widely patent. No stenotic disease in the cervical region. Skeleton: Ordinary mild midcervical spondylosis. Other neck: No mass or lymphadenopathy. Upper chest: Incidental azygos fissure on the right. Mild pulmonary scarring. No active process evident. Review of the MIP images  confirms the above findings CTA HEAD FINDINGS Anterior circulation: Both internal carotid arteries are patent through the skull base and siphon regions. There is atherosclerotic calcification in the carotid siphon regions but no stenosis greater than about 30%. The anterior and middle cerebral vessels are patent without proximal stenosis, aneurysm or vascular malformation. Posterior circulation: Both vertebral arteries are widely patent to the basilar. No basilar stenosis. Posterior circulation branch vessels are normal. Venous sinuses: Patent and normal. Anatomic variants: None significant Delayed phase: 2 cm pituitary macro adenoma egg and demonstrated. Review of the MIP images confirms the above findings IMPRESSION: No significant vascular finding. Ordinary atherosclerosis. No flow limiting stenosis or significant irregularity. No large or medium vessel occlusion. Redemonstration of 2 cm pituitary macro adenoma. Electronically Signed   By: Nelson Chimes M.D.   On: 12/22/2017 15:45   Ct Head Code Stroke Wo Contrast  Result Date: 12/22/2017 CLINICAL DATA:  Code stroke.  Right-sided facial droop. EXAM: CT HEAD WITHOUT CONTRAST TECHNIQUE: Contiguous axial images were obtained from the base of the skull through the vertex without intravenous contrast. COMPARISON:  None. FINDINGS: Brain: No evidence of acute infarction, hemorrhage, hydrocephalus, extra-axial collection or mass effect. Generalized atrophy with ventriculomegaly. Mild chronic small vessel ischemic change in the cerebral white matter. 18 mm sellar mass with sellar expansion, usually pituitary adenoma. No destructive changes. Vascular: No hyperdense vessel or unexpected calcification. Skull: Negative Sinuses/Orbits: Bilateral cataract resection. Other: These results were communicated to Dr. Rory Percy at 3:25 pmon 5/13/2019by text page via the [facility] messaging system. ASPECTS [facility] Stroke Program Early CT Score) - Ganglionic level infarction (caudate,  lentiform nuclei, internal capsule, insula, M1-M3 cortex): 7 - Supraganglionic infarction (M4-M6 cortex): 3 Total score (0-10 with 10 being normal): 10 IMPRESSION: 1. No acute finding.  ASPECTS is 10. 2. 18 mm mass in the expanded sella, probable pituitary adenoma. 3. Generalized atrophy. Electronically Signed   By: Monte Fantasia M.D.   On: 12/22/2017 15:26    EKG: Sinus rhythm Prolonged PR interval IVCD, consider atypical RBBB   Assessment/Plan Principal Problem:   Stroke-like symptoms Active Problems:   Overactive bladder   Hypertension   Hypercholesterolemia   Dementia   #1. Stroke like symptoms/facial droop and slurred speech. esolved at the time of admission. Evaluated by neurology. CTH with no acute changes, CTA no LVO. Incidental pituitary adenoma. Recommend admission with stroke workup -dmit to telemetry -Obtain a hemoglobin A1c and a fasting lipid panel -MRI -Echocardiogram -Continue aspirin and increase statin to 80 mg -Heart healthy diet as  he has passed the swallow eval -Frequent neuro checks  #2. Hypercholesterolemia. Home medications include Lipitor -See above -Increase Lipitor  #3. Hypertension. Controlled in the emergency department. Home medications include metoprolol 12.5 mg daily -monitor -Hold home meds for now  #4. Dementia. Mild. -Continue Aricept  #5. Overactive bladder. Stable at baseline -Continue home meds  6. Pituitary mass.  -Outpatient follow-up   DVT prophylaxis: lovenox  Code Status: dnr  Family Communication: son at bedside  Disposition Plan: back to facility  Consults called: arora  Admission status: obs    Radene Gunning MD [facility]  If 7PM-7AM, please contact night-coverage [email or web address] Password [facility]  12/22/2017, 5:46 PM        I have directly reviewed the clinical findings, lab results and imaging studies. I have interviewed and examined the patient and agree with the documentation and management as  recorded by the Physician extender. In short 84 yo Right handed white male who moved from [place] to a local ALF few days ago, presents with almost resolved Expressive aphasia and R.sided weakness, CT-CTA head and Neck non acute, pleased on High dose ASA and Lipitor, seen by Neuro, admit for TIA/CVA workup. He is DNR.   Lala Lund M.D on 12/22/2017 at [facility] Office  [phone]

## 2017-12-22 NOTE — Consult Note (Addendum)
Requesting Physician: ED MD    Chief Complaint: Aphasia which resolved by the time patient had entered emergency room  History obtained from:  Patient   EMS HPI:                                                                                                                                         Billy [name] is an 84 y.o. male who recently is from [place] but lives in a nursing home.  Patient states he has no past medical history such as hypertension, smoking, hyperlipidemia, diabetes.  Patient does admit to smoking a pipe for quite a long time ago but quit cold [place].  He admits to not taking aspirin.  Apparently at approximately 1230 this afternoon he had lunch and was doing fine 10 minutes later he was checked upon and was noted to have expressive aphasia.  EMS arrived and noted some expressive aphasia however in route this cleared.   It is noted that patient has a right facial droop but patient does not know when this occurred or if it is new.   He does state that his daughter notes that he often drools out of that side of his mouth (could not say right or left).  Currently he has only a facial droop and possibly some slow speech.  Date last known well: Date: 12/22/2017 Time last known well: Time: 12:30 tPA Given: No: NIH stroke scale of 2 with minimal symptoms NIH stroke scale of 2 Modified Rankin: Rankin Score=1   No past medical history on file and patient only reports smoking a pipe.  He is due for a bladder surgery.  No surgical history.  Family History  Problem Relation Age of Onset  . Hypertension Mother   . Hypertension Father      Social History:  has no tobacco, alcohol, and drug history on file.  Allergies: Allergies not on file  Medications:                                                                                                                           Current Facility-Administered Medications  Medication Dose Route Frequency Provider Last Rate Last Dose   . iopamidol (ISOVUE-370) 76 % injection            No current outpatient medications on file.   ROS:  History obtained from the patient  General ROS: negative for - chills, fatigue, fever, night sweats, weight gain or weight loss Psychological ROS: negative for - , hallucinations, memory difficulties, mood swings or  Ophthalmic ROS: negative for - blurry vision, double vision, eye pain or loss of vision ENT ROS: negative for - epistaxis, nasal discharge, oral lesions, sore throat, tinnitus or vertigo Respiratory ROS: negative for - cough,  shortness of breath or wheezing Cardiovascular ROS: negative for - chest pain, dyspnea on exertion,  Gastrointestinal ROS: negative for - abdominal pain, diarrhea,  nausea/vomiting or stool incontinence Genito-Urinary ROS: negative for - dysuria, hematuria, incontinence or urinary frequency/urgency Musculoskeletal ROS: negative for - joint swelling or muscular weakness Neurological ROS: as noted in HPI  General Examination:                                                                                                      There were no vitals taken for this visit. HEENT-  Normocephalic, no lesions, without obvious abnormality.  Normal external eye and conjunctiva.   Extremities- Warm, dry and intact Musculoskeletal-no joint tenderness, deformity or swelling Skin-warm and dry, no hyperpigmentation, vitiligo, or suspicious lesions  Neurological Examination Mental Status: Alert, oriented, thought content appropriate.  Speech fluent without evidence of aphasia.  Able to follow 3 step commands without difficulty. Cranial Nerves: II:  Visual fields grossly normal,  III,IV, VI: ptosis not present, extra-ocular motions intact bilaterally, pupils equal, round, reactive to light and accommodation V,VII: smile symmetric but at  rest he does have a right facial droop, facial light touch sensation normal bilaterally VIII: hearing normal bilaterally IX,X: uvula rises symmetrically XI: bilateral shoulder shrug XII: midline tongue extension Motor: Right : Upper extremity   5/5    Left:     Upper extremity   5/5  Lower extremity   5/5     Lower extremity   5/5 Tone and bulk:normal tone throughout; no atrophy noted Sensory: Pinprick and light touch intact throughout, bilaterally Deep Tendon Reflexes: 1+ and symmetric throughout Plantars: Right: downgoing   Left: downgoing Cerebellar: normal finger-to-nose,  and normal heel-to-shin test Gait: Not tested   Lab Results: Basic Metabolic Panel: Recent Labs  Lab 12/22/17 1518  NA 138  K 4.1  CL 103  GLUCOSE 115*  BUN 27*  CREATININE 0.90    CBC: Recent Labs  Lab 12/22/17 1518  HGB 12.6*  HCT 37.0*  Imaging: Ct Angio Head W Or Wo Contrast  Result Date: 12/22/2017 CLINICAL DATA:  Right-sided facial droop. EXAM: CT ANGIOGRAPHY HEAD AND NECK TECHNIQUE: Multidetector CT imaging of the head and neck was performed using the standard protocol during bolus administration of intravenous contrast. Multiplanar CT image reconstructions and MIPs were obtained to evaluate the vascular anatomy. Carotid stenosis measurements (when applicable) are obtained utilizing NASCET criteria, using the distal internal carotid diameter as the denominator. CONTRAST:  49mL ISOVUE-370 IOPAMIDOL (ISOVUE-370) INJECTION 76% COMPARISON:  CT earlier same day FINDINGS: CTA NECK FINDINGS Aortic arch: The aortic arch shows atherosclerotic calcification. Branching pattern of the brachiocephalic vessels from the arch  is normal. No origin stenosis. Right carotid system: Common carotid artery widely patent to the bifurcation. Mild calcified plaque at the carotid bifurcation but no stenosis or irregularity. Cervical ICA widely patent. Left carotid system: Common carotid artery widely patent to the  bifurcation. Minimal atherosclerotic plaque at the carotid bifurcation but no stenosis or irregularity. Cervical ICA is tortuous but widely patent. Vertebral arteries: Both vertebral arteries are approximately equal in size. The origins are widely patent. No stenotic disease in the cervical region. Skeleton: Ordinary mild midcervical spondylosis. Other neck: No mass or lymphadenopathy. Upper chest: Incidental azygos fissure on the right. Mild pulmonary scarring. No active process evident. Review of the MIP images confirms the above findings CTA HEAD FINDINGS Anterior circulation: Both internal carotid arteries are patent through the skull base and siphon regions. There is atherosclerotic calcification in the carotid siphon regions but no stenosis greater than about 30%. The anterior and middle cerebral vessels are patent without proximal stenosis, aneurysm or vascular malformation. Posterior circulation: Both vertebral arteries are widely patent to the basilar. No basilar stenosis. Posterior circulation branch vessels are normal. Venous sinuses: Patent and normal. Anatomic variants: None significant Delayed phase: 2 cm pituitary macro adenoma egg and demonstrated. Review of the MIP images confirms the above findings IMPRESSION: No significant vascular finding. Ordinary atherosclerosis. No flow limiting stenosis or significant irregularity. No large or medium vessel occlusion. Redemonstration of 2 cm pituitary macro adenoma. Electronically Signed   By: Nelson Chimes M.D.   On: 12/22/2017 15:45   Ct Angio Neck W Or Wo Contrast  Result Date: 12/22/2017 CLINICAL DATA:  Right-sided facial droop. EXAM: CT ANGIOGRAPHY HEAD AND NECK TECHNIQUE: Multidetector CT imaging of the head and neck was performed using the standard protocol during bolus administration of intravenous contrast. Multiplanar CT image reconstructions and MIPs were obtained to evaluate the vascular anatomy. Carotid stenosis measurements (when applicable)  are obtained utilizing NASCET criteria, using the distal internal carotid diameter as the denominator. CONTRAST:  60mL ISOVUE-370 IOPAMIDOL (ISOVUE-370) INJECTION 76% COMPARISON:  CT earlier same day FINDINGS: CTA NECK FINDINGS Aortic arch: The aortic arch shows atherosclerotic calcification. Branching pattern of the brachiocephalic vessels from the arch is normal. No origin stenosis. Right carotid system: Common carotid artery widely patent to the bifurcation. Mild calcified plaque at the carotid bifurcation but no stenosis or irregularity. Cervical ICA widely patent. Left carotid system: Common carotid artery widely patent to the bifurcation. Minimal atherosclerotic plaque at the carotid bifurcation but no stenosis or irregularity. Cervical ICA is tortuous but widely patent. Vertebral arteries: Both vertebral arteries are approximately equal in size. The origins are widely patent. No stenotic disease in the cervical region. Skeleton: Ordinary mild midcervical spondylosis. Other neck: No mass or lymphadenopathy. Upper chest: Incidental azygos fissure on the right. Mild pulmonary scarring. No active process evident. Review of the MIP images confirms the above findings CTA HEAD FINDINGS Anterior circulation: Both internal carotid arteries are patent through the skull base and siphon regions. There is atherosclerotic calcification in the carotid siphon regions but no stenosis greater than about 30%. The anterior and middle cerebral vessels are patent without proximal stenosis, aneurysm or vascular malformation. Posterior circulation: Both vertebral arteries are widely patent to the basilar. No basilar stenosis. Posterior circulation branch vessels are normal. Venous sinuses: Patent and normal. Anatomic variants: None significant Delayed phase: 2 cm pituitary macro adenoma egg and demonstrated. Review of the MIP images confirms the above findings IMPRESSION: No significant vascular finding. Ordinary atherosclerosis. No  flow limiting stenosis  or significant irregularity. No large or medium vessel occlusion. Redemonstration of 2 cm pituitary macro adenoma. Electronically Signed   By: Nelson Chimes M.D.   On: 12/22/2017 15:45   Ct Head Code Stroke Wo Contrast  Result Date: 12/22/2017 CLINICAL DATA:  Code stroke.  Right-sided facial droop. EXAM: CT HEAD WITHOUT CONTRAST TECHNIQUE: Contiguous axial images were obtained from the base of the skull through the vertex without intravenous contrast. COMPARISON:  None. FINDINGS: Brain: No evidence of acute infarction, hemorrhage, hydrocephalus, extra-axial collection or mass effect. Generalized atrophy with ventriculomegaly. Mild chronic small vessel ischemic change in the cerebral white matter. 18 mm sellar mass with sellar expansion, usually pituitary adenoma. No destructive changes. Vascular: No hyperdense vessel or unexpected calcification. Skull: Negative Sinuses/Orbits: Bilateral cataract resection. Other: These results were communicated to Dr. Rory Percy at 3:25 pmon 5/13/2019by text page via the [facility] messaging system. ASPECTS [facility] Stroke Program Early CT Score) - Ganglionic level infarction (caudate, lentiform nuclei, internal capsule, insula, M1-M3 cortex): 7 - Supraganglionic infarction (M4-M6 cortex): 3 Total score (0-10 with 10 being normal): 10 IMPRESSION: 1. No acute finding.  ASPECTS is 10. 2. 18 mm mass in the expanded sella, probable pituitary adenoma. 3. Generalized atrophy. Electronically Signed   By: Monte Fantasia M.D.   On: 12/22/2017 15:26    Assessment and plan discussed with with attending physician and they are in agreement.    Etta Quill PA-C [facility] [phone]  12/22/2017, 3:22 PM Attending Neurohospitalist Addendum Patient seen and examined with APP/Resident. Agree with the history and physical as documented above. CTH no acute changes. Incidental pituitary adenoma. CTA-no LVO  Assessment: 84 y.o. male with no significant past  medical history other than smoking a pipe.  Patient arrives today with expressive aphasia that is clear by the time entering the emergency department.  Currently exam only shows a right facial droop with is unknown onset along with mild slow speech.  At this point etiology is likely either stroke that is resolved versus TIA.  Patient was not a TPA candidate due to no significant neurological deficits.  Stroke Risk Factors - none  Recommend --HgbA1c, fasting lipid panel --MRI --PT consult, OT consult, Speech consult --Echocardiogram --80 mg of Atorvistatin --Prophylactic therapy-Antiplatelet med: --Aspirin 325 mg daily --Telemetry monitoring --Frequent neuro checks --NPO until passes stroke swallow screen --Pituatary mass w/u as outpatient  --please page stroke NP  Or  PA  Or MD from 8am -4 pm  as this patient from this time will be  followed by the stroke.   You can look them up on [email or web address]  Password [email or web address]  I have independently reviewed the chart, obtained history, review of systems and examined the patient.I have personally reviewed pertinent head/neck/spine imaging (CT/MRI). Please feel free to call with any questions. --- Amie Portland, MD [facility] Pager: [phone]  If 7pm to 7am, please call on call as listed on AMION.

## 2017-12-22 NOTE — ED Notes (Signed)
Pt aware of need for urine specimen. 

## 2017-12-22 NOTE — ED Triage Notes (Signed)
Pt arrives via EMS from [facility] with expressive aphasia and R facial droop. LKW 1230. Pt with slurred speech and R facial droop on arrival. EMS reports expressive aphasia has cleared.

## 2017-12-22 NOTE — ED Provider Notes (Signed)
[facility] EMERGENCY DEPARTMENT Provider Note   CSN: [id number] Arrival date & time: 12/22/17  1514   An emergency department physician performed an initial assessment on this suspected stroke patient at 1514.  History   Chief Complaint Chief Complaint  Patient presents with  . Code Stroke    HPI Billy [name] is a 84 y.o. male.  84 year old male with prior medical history significant for hypertension and dementia presents with possible TIA symptoms.  Patient was last known normal around 1230 this afternoon.  At that time, he developed a aphasia and a right facial droop.  His symptoms have resolved upon arrival to the ED.  He was seen as a code stroke by neurology.  He does not meet criteria for TPA at this time.  Upon my evaluation, patient is back to his baseline.  He is without specific acute complaint.  He denies associated chest pain, shortness of breath, nausea, vomiting, or other acute complaint.  The history is provided by the patient, medical records and the EMS personnel.  Neurologic Problem  This is a new problem. The current episode started 3 to 5 hours ago. The problem occurs rarely. The problem has been resolved. Pertinent negatives include no chest pain and no abdominal pain. Nothing aggravates the symptoms. Nothing relieves the symptoms. He has tried nothing for the symptoms.    History reviewed. No pertinent past medical history.  There are no active problems to display for this patient.   History reviewed. No pertinent surgical history.      Home Medications    Prior to Admission medications   Medication Sig Start Date End Date Taking? Authorizing Provider  acetaminophen (TYLENOL) 650 MG CR tablet Take 650 mg by mouth daily.   Yes [provider]  atorvastatin (LIPITOR) 10 MG tablet Take 10 mg by mouth at bedtime.   Yes [provider]  donepezil (ARICEPT) 10 MG tablet Take 10 mg by mouth at bedtime.   Yes [provider]  metoprolol tartrate (LOPRESSOR) 25 MG tablet Take 12.5 mg by mouth daily.   Yes [provider]  mirabegron ER (MYRBETRIQ) 50 MG TB24 tablet Take 50 mg by mouth daily.   Yes [provider]  Multiple Vitamins-Minerals (CENTRUM SILVER 50+MEN PO) Take 1 tablet by mouth daily.   Yes [provider]  Multiple Vitamins-Minerals (PRESERVISION AREDS 2+MULTI VIT PO) Take 1 capsule by mouth 2 (two) times daily.   Yes [provider]  traZODone (DESYREL) 50 MG tablet Take 50 mg by mouth at bedtime.   Yes [provider]    Family History Family History  Problem Relation Age of Onset  . Hypertension Mother   . Hypertension Father     Social History Social History   Tobacco Use  . Smoking status: Not on file  Substance Use Topics  . Alcohol use: Not on file  . Drug use: Not on file     Allergies   Patient has no known allergies.   Review of Systems Review of Systems  Cardiovascular: Negative for chest pain.  Gastrointestinal: Negative for abdominal pain.  All other systems reviewed and are negative.    Physical Exam Updated Vital Signs BP (!) 152/71 (BP Location: Right Arm)   Pulse 75   Temp 98.3 F (36.8 C) (Oral)   Resp 20   SpO2 96%   Physical Exam  Constitutional: He is oriented to person, place, and time. He appears well-developed and well-nourished. No distress.  HENT:  Head: Normocephalic and atraumatic.  Mouth/Throat: Oropharynx is clear and moist.  Eyes: Pupils are equal, round, and reactive to light. Conjunctivae and EOM are normal.  Neck: Normal range of motion. Neck supple.  Cardiovascular: Normal rate, regular rhythm and normal heart sounds.  Pulmonary/Chest: Effort normal and breath sounds normal. No respiratory distress.  Abdominal: Soft. He exhibits no distension. There is no tenderness.  Musculoskeletal: Normal range of motion. He exhibits no edema or deformity.  Neurological: He is alert and  oriented to person, place, and time. He exhibits abnormal muscle tone.  Normal speech No facial droop 5 out of 5 strength in all 4 extremities VAN negative  Skin: Skin is warm and dry.  Psychiatric: He has a normal mood and affect.  Nursing note and vitals reviewed.    ED Treatments / Results  Labs (all labs ordered are listed, but only abnormal results are displayed) Labs Reviewed  CBC - Abnormal; Notable for the following components:      Result Value   RBC 4.12 (*)    Hemoglobin 12.8 (*)    HCT 37.7 (*)    All other components within normal limits  COMPREHENSIVE METABOLIC PANEL - Abnormal; Notable for the following components:   Glucose, Bld 117 (*)    BUN 25 (*)    Albumin 3.4 (*)    All other components within normal limits  I-STAT CHEM 8, ED - Abnormal; Notable for the following components:   BUN 27 (*)    Glucose, Bld 115 (*)    Hemoglobin 12.6 (*)    HCT 37.0 (*)    All other components within normal limits  CBG MONITORING, ED - Abnormal; Notable for the following components:   Glucose-Capillary 111 (*)    All other components within normal limits  ETHANOL  PROTIME-INR  APTT  DIFFERENTIAL  URINALYSIS, ROUTINE W REFLEX MICROSCOPIC  RAPID URINE DRUG SCREEN, HOSP PERFORMED  PROLACTIN  TSH  CORTISOL  I-STAT TROPONIN, ED    EKG EKG Interpretation  Date/Time:  Monday Dec 22 2017 15:41:09 EDT Ventricular Rate:  71 PR Interval:    QRS Duration: 140 QT Interval:  440 QTC Calculation: 479 R Axis:   -39 Text Interpretation:  Sinus rhythm Prolonged PR interval IVCD, consider atypical RBBB Consider anterior infarct Confirmed by Dene Gentry [phone]) on 12/22/2017 3:46:06 PM   Radiology Ct Angio Head W Or Wo Contrast  Result Date: 12/22/2017 CLINICAL DATA:  Right-sided facial droop. EXAM: CT ANGIOGRAPHY HEAD AND NECK TECHNIQUE: Multidetector CT imaging of the head and neck was performed using the standard protocol during bolus administration of intravenous  contrast. Multiplanar CT image reconstructions and MIPs were obtained to evaluate the vascular anatomy. Carotid stenosis measurements (when applicable) are obtained utilizing NASCET criteria, using the distal internal carotid diameter as the denominator. CONTRAST:  64mL ISOVUE-370 IOPAMIDOL (ISOVUE-370) INJECTION 76% COMPARISON:  CT earlier same day FINDINGS: CTA NECK FINDINGS Aortic arch: The aortic arch shows atherosclerotic calcification. Branching pattern of the brachiocephalic vessels from the arch is normal. No origin stenosis. Right carotid system: Common carotid artery widely patent to the bifurcation. Mild calcified plaque at the carotid bifurcation but no stenosis or irregularity. Cervical ICA widely patent. Left carotid system: Common carotid artery widely patent to the bifurcation. Minimal atherosclerotic plaque at the carotid bifurcation but no stenosis or irregularity. Cervical ICA is tortuous but widely patent. Vertebral arteries: Both vertebral arteries are approximately equal in size. The origins are widely patent. No stenotic disease in the  cervical region. Skeleton: Ordinary mild midcervical spondylosis. Other neck: No mass or lymphadenopathy. Upper chest: Incidental azygos fissure on the right. Mild pulmonary scarring. No active process evident. Review of the MIP images confirms the above findings CTA HEAD FINDINGS Anterior circulation: Both internal carotid arteries are patent through the skull base and siphon regions. There is atherosclerotic calcification in the carotid siphon regions but no stenosis greater than about 30%. The anterior and middle cerebral vessels are patent without proximal stenosis, aneurysm or vascular malformation. Posterior circulation: Both vertebral arteries are widely patent to the basilar. No basilar stenosis. Posterior circulation branch vessels are normal. Venous sinuses: Patent and normal. Anatomic variants: None significant Delayed phase: 2 cm pituitary macro  adenoma egg and demonstrated. Review of the MIP images confirms the above findings IMPRESSION: No significant vascular finding. Ordinary atherosclerosis. No flow limiting stenosis or significant irregularity. No large or medium vessel occlusion. Redemonstration of 2 cm pituitary macro adenoma. Electronically Signed   By: Nelson Chimes M.D.   On: 12/22/2017 15:45   Ct Angio Neck W Or Wo Contrast  Result Date: 12/22/2017 CLINICAL DATA:  Right-sided facial droop. EXAM: CT ANGIOGRAPHY HEAD AND NECK TECHNIQUE: Multidetector CT imaging of the head and neck was performed using the standard protocol during bolus administration of intravenous contrast. Multiplanar CT image reconstructions and MIPs were obtained to evaluate the vascular anatomy. Carotid stenosis measurements (when applicable) are obtained utilizing NASCET criteria, using the distal internal carotid diameter as the denominator. CONTRAST:  72mL ISOVUE-370 IOPAMIDOL (ISOVUE-370) INJECTION 76% COMPARISON:  CT earlier same day FINDINGS: CTA NECK FINDINGS Aortic arch: The aortic arch shows atherosclerotic calcification. Branching pattern of the brachiocephalic vessels from the arch is normal. No origin stenosis. Right carotid system: Common carotid artery widely patent to the bifurcation. Mild calcified plaque at the carotid bifurcation but no stenosis or irregularity. Cervical ICA widely patent. Left carotid system: Common carotid artery widely patent to the bifurcation. Minimal atherosclerotic plaque at the carotid bifurcation but no stenosis or irregularity. Cervical ICA is tortuous but widely patent. Vertebral arteries: Both vertebral arteries are approximately equal in size. The origins are widely patent. No stenotic disease in the cervical region. Skeleton: Ordinary mild midcervical spondylosis. Other neck: No mass or lymphadenopathy. Upper chest: Incidental azygos fissure on the right. Mild pulmonary scarring. No active process evident. Review of the MIP  images confirms the above findings CTA HEAD FINDINGS Anterior circulation: Both internal carotid arteries are patent through the skull base and siphon regions. There is atherosclerotic calcification in the carotid siphon regions but no stenosis greater than about 30%. The anterior and middle cerebral vessels are patent without proximal stenosis, aneurysm or vascular malformation. Posterior circulation: Both vertebral arteries are widely patent to the basilar. No basilar stenosis. Posterior circulation branch vessels are normal. Venous sinuses: Patent and normal. Anatomic variants: None significant Delayed phase: 2 cm pituitary macro adenoma egg and demonstrated. Review of the MIP images confirms the above findings IMPRESSION: No significant vascular finding. Ordinary atherosclerosis. No flow limiting stenosis or significant irregularity. No large or medium vessel occlusion. Redemonstration of 2 cm pituitary macro adenoma. Electronically Signed   By: Nelson Chimes M.D.   On: 12/22/2017 15:45   Ct Head Code Stroke Wo Contrast  Result Date: 12/22/2017 CLINICAL DATA:  Code stroke.  Right-sided facial droop. EXAM: CT HEAD WITHOUT CONTRAST TECHNIQUE: Contiguous axial images were obtained from the base of the skull through the vertex without intravenous contrast. COMPARISON:  None. FINDINGS: Brain: No evidence of acute  infarction, hemorrhage, hydrocephalus, extra-axial collection or mass effect. Generalized atrophy with ventriculomegaly. Mild chronic small vessel ischemic change in the cerebral white matter. 18 mm sellar mass with sellar expansion, usually pituitary adenoma. No destructive changes. Vascular: No hyperdense vessel or unexpected calcification. Skull: Negative Sinuses/Orbits: Bilateral cataract resection. Other: These results were communicated to Dr. Rory Percy at 3:25 pmon 5/13/2019by text page via the [facility] messaging system. ASPECTS [facility] Stroke Program Early CT Score) - Ganglionic level infarction  (caudate, lentiform nuclei, internal capsule, insula, M1-M3 cortex): 7 - Supraganglionic infarction (M4-M6 cortex): 3 Total score (0-10 with 10 being normal): 10 IMPRESSION: 1. No acute finding.  ASPECTS is 10. 2. 18 mm mass in the expanded sella, probable pituitary adenoma. 3. Generalized atrophy. Electronically Signed   By: Monte Fantasia M.D.   On: 12/22/2017 15:26    Procedures Procedures (including critical care time)  Medications Ordered in ED Medications  aspirin EC tablet 325 mg (has no administration in time range)  iopamidol (ISOVUE-370) 76 % injection 50 mL (50 mLs Intravenous Contrast Given 12/22/17 1532)     Initial Impression / Assessment and Plan / ED Course  I have reviewed the triage vital signs and the nursing notes.  Pertinent labs & imaging results that were available during my care of the patient were reviewed by me and considered in my medical decision making (see chart for details).     MDM  Screen Complete   Patient is presenting with possible TIA/CVA symptoms.  His symptoms have resolved upon evaluation in the ED.  Per neurology, he does not meet criteria for TPA at this time.  Patient will be admitted for further work-up to the medicine service.  Patient is comfortable at time of admission.  Patient and his family understand the plan of care.    Final Clinical Impressions(s) / ED Diagnoses   Final diagnoses:  TIA (transient ischemic attack)    ED Discharge Orders    None       Valarie Merino, MD 12/22/17 1710

## 2017-12-23 ENCOUNTER — Other Ambulatory Visit (HOSPITAL_COMMUNITY): Payer: Self-pay

## 2017-12-23 ENCOUNTER — Observation Stay (HOSPITAL_BASED_OUTPATIENT_CLINIC_OR_DEPARTMENT_OTHER): Payer: Medicare Other

## 2017-12-23 DIAGNOSIS — I10 Essential (primary) hypertension: Secondary | ICD-10-CM | POA: Diagnosis not present

## 2017-12-23 DIAGNOSIS — G459 Transient cerebral ischemic attack, unspecified: Secondary | ICD-10-CM | POA: Diagnosis not present

## 2017-12-23 DIAGNOSIS — R2990 Unspecified symptoms and signs involving the nervous system: Secondary | ICD-10-CM | POA: Diagnosis not present

## 2017-12-23 DIAGNOSIS — I351 Nonrheumatic aortic (valve) insufficiency: Secondary | ICD-10-CM | POA: Diagnosis not present

## 2017-12-23 DIAGNOSIS — E7800 Pure hypercholesterolemia, unspecified: Secondary | ICD-10-CM | POA: Diagnosis not present

## 2017-12-23 LAB — MRSA PCR SCREENING: MRSA by PCR: NEGATIVE

## 2017-12-23 LAB — ECHOCARDIOGRAM COMPLETE

## 2017-12-23 MED ORDER — ASPIRIN 81 MG PO CHEW
81.0000 mg | CHEWABLE_TABLET | Freq: Every day | ORAL | Status: DC
  Filled 2017-12-23: qty 1

## 2017-12-23 MED ORDER — ATORVASTATIN CALCIUM 10 MG PO TABS: 10.0000 mg | ORAL_TABLET | Freq: Every day | ORAL | Status: DC

## 2017-12-23 MED ORDER — CLOPIDOGREL BISULFATE 75 MG PO TABS
75.0000 mg | ORAL_TABLET | Freq: Every day | ORAL | Status: DC
  Administered 2017-12-23: 75 mg via ORAL
  Filled 2017-12-23: qty 1

## 2017-12-23 MED ORDER — ASPIRIN 81 MG PO TBEC: 81.0000 mg | DELAYED_RELEASE_TABLET | Freq: Every day | ORAL | Status: DC

## 2017-12-23 MED ORDER — ASPIRIN EC 81 MG PO TBEC
81.0000 mg | DELAYED_RELEASE_TABLET | Freq: Every day | ORAL | Status: DC
  Administered 2017-12-23: 81 mg via ORAL

## 2017-12-23 MED ORDER — CLOPIDOGREL BISULFATE 75 MG PO TABS: 75.0000 mg | ORAL_TABLET | Freq: Every day | ORAL | 0 refills | Status: DC

## 2017-12-23 NOTE — Progress Notes (Signed)
Attempted to call report to Claiborne Billings, RN at facility. Will attempt again.

## 2017-12-23 NOTE — Progress Notes (Signed)
SLP Cancellation Note  Patient Details Name: Billy [name] MRN: [id number] DOB: Dec 30, 1926   Cancelled treatment:       Reason Eval/Treat Not Completed: SLP screened, no needs identified, will sign off   Juan Quam Laurice 12/23/2017, 10:36 AM

## 2017-12-23 NOTE — Care Management Note (Signed)
Case Management Note  Patient Details  Name: Billy [name] MRN: [id number] Date of Birth: 03/08/1927  Subjective/Objective:   Pt admitted with stroke like symptoms. He is from [facility].               Action/Plan: Plan is for patient to return to [facility] when medically ready. CM following.  Expected Discharge Date:                  Expected Discharge Plan:  Assisted Living / Rest Home  In-House Referral:  Clinical Social Work  Discharge planning Services     Post Acute Care Choice:    Choice offered to:     DME Arranged:    DME Agency:     HH Arranged:    [place] Agency:     Status of Service:  In process, will continue to follow  If discussed at Long Length of Stay Meetings, dates discussed:    Additional Comments:  Pollie Friar, RN 12/23/2017, 11:39 AM

## 2017-12-23 NOTE — NC FL2 (Signed)
  [place] MEDICAID FL2 LEVEL OF CARE SCREENING TOOL     IDENTIFICATION  Patient Name: Billy [name] Birthdate: 06/04/1927 Sex: male Admission Date (Current Location): 12/22/2017  [facility] and [place] Number:  Herbalist and Address:  [facility] [place], [place] [address], [place], [place] [address]      Provider Number: [id number]  Attending Physician Name and Address:  Geradine Girt, DO  Relative Name and Phone Number:  [name], son, [phone]    Current Level of Care: Hospital Recommended Level of Care: [place] Prior Approval Number:    Date Approved/Denied:   PASRR Number:    Discharge Plan: Other (Comment)(ALF)    Current Diagnoses: Patient Active Problem List   Diagnosis Date Noted  . Stroke-like symptoms 12/22/2017  . Overactive bladder   . Hypertension   . Hypercholesterolemia   . Dementia   . 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[place])     Orientation RESPIRATION BLADDER Height & Weight     Self, Time, Situation, Place  Normal Continent Weight:   Height:     BEHAVIORAL SYMPTOMS/MOOD NEUROLOGICAL BOWEL NUTRITION STATUS      Continent Diet(Regular)  AMBULATORY STATUS COMMUNICATION OF NEEDS Skin   Supervision Verbally Normal                       Personal Care Assistance Level of Assistance  Bathing, Feeding, Dressing Bathing Assistance: Limited assistance Feeding assistance: Independent Dressing Assistance: Independent     Functional Limitations Info  Sight, Hearing, Speech Sight Info: Adequate Hearing Info: Adequate Speech Info: Adequate    SPECIAL CARE FACTORS FREQUENCY                       Contractures      Additional Factors Info  Code Status, Allergies, Psychotropic Code Status Info: DNR Allergies Info: NKA Psychotropic Info: trazodone         Current Medications (12/23/2017):    Discharge Medications: TAKE these medications   acetaminophen 650 MG CR tablet Commonly known as:  TYLENOL Take  650 mg by mouth daily.   aspirin 81 MG EC tablet Take 1 tablet (81 mg total) by mouth daily. Start taking on:  12/24/2017   atorvastatin 10 MG tablet Commonly known as:  LIPITOR Take 10 mg by mouth at bedtime.   CENTRUM SILVER 50+MEN PO Take 1 tablet by mouth daily.   PRESERVISION AREDS 2+MULTI VIT PO Take 1 capsule by mouth 2 (two) times daily.   clopidogrel 75 MG tablet Commonly known as:  PLAVIX Take 1 tablet (75 mg total) by mouth daily. Start taking on:  12/24/2017   donepezil 10 MG tablet Commonly known as:  ARICEPT Take 10 mg by mouth at bedtime.   metoprolol tartrate 25 MG tablet Commonly known as:  LOPRESSOR Take 12.5 mg by mouth daily.   mirabegron ER 50 MG Tb24 tablet Commonly known as:  MYRBETRIQ Take 50 mg by mouth daily.   traZODone 50 MG tablet Commonly known as:  DESYREL Take 50 mg by mouth at bedtime.       Relevant Imaging Results:  Relevant Lab Results:   Additional [place] Cliffard Hair, LCSWA

## 2017-12-23 NOTE — Progress Notes (Signed)
Gave report to [employer] at [facility].

## 2017-12-23 NOTE — Progress Notes (Addendum)
STROKE TEAM PROGRESS NOTE   INTERVAL HISTORY  I have reviewed in detail patient's history of present illness with the patient and his daughter-in-law and the bedside.His son and daughter in law are at the bedside.  He states he language has improved and not recurred (knew what he wanted to say just couldn't get the words out). No new complaints. Walked with therapy in the hall with cane, like he does at home. Family agrees he does sound dysarthric at times, but mostly at baseline. Recently moved from [place] to the [place] to an assisted living.  Vitals:   12/23/17 0020 12/23/17 0220 12/23/17 0420 12/23/17 0805  BP: (!) 119/97 134/64 131/67 (!) 153/68  Pulse: 76 80 62 66  Resp: 20 20 20 20   Temp:   98 F (36.7 C) 97.7 F (36.5 C)  TempSrc:   Oral Oral  SpO2: 96% 96% 93% 97%    CBC:  Recent Labs  Lab 12/22/17 1515 12/22/17 1518  WBC 7.5  --   NEUTROABS 3.8  --   HGB 12.8* 12.6*  HCT 37.7* 37.0*  MCV 91.5  --   PLT 198  --     Basic Metabolic Panel:  Recent Labs  Lab 12/22/17 1515 12/22/17 1518  NA 136 138  K 4.1 4.1  CL 104 103  CO2 23  --   GLUCOSE 117* 115*  BUN 25* 27*  CREATININE 1.01 0.90  CALCIUM 9.1  --    Lipid Panel:     Component Value Date/Time   CHOL 159 12/22/2017 1826   TRIG 284 (H) 12/22/2017 1826   HDL 44 12/22/2017 1826   CHOLHDL 3.6 12/22/2017 1826   VLDL 57 (H) 12/22/2017 1826   LDLCALC 58 12/22/2017 1826   HgbA1c:  Lab Results  Component Value Date   HGBA1C 5.6 12/22/2017   Urine Drug Screen:     Component Value Date/Time   LABOPIA NONE DETECTED 12/22/2017 1616   COCAINSCRNUR NONE DETECTED 12/22/2017 1616   LABBENZ NONE DETECTED 12/22/2017 1616   AMPHETMU NONE DETECTED 12/22/2017 1616   THCU NONE DETECTED 12/22/2017 1616   LABBARB NONE DETECTED 12/22/2017 1616    Alcohol Level     Component Value Date/Time   ETH <10 12/22/2017 1515    IMAGING Ct Angio Head W Or Wo Contrast  Result Date: 12/22/2017 CLINICAL DATA:   Right-sided facial droop. EXAM: CT ANGIOGRAPHY HEAD AND NECK TECHNIQUE: Multidetector CT imaging of the head and neck was performed using the standard protocol during bolus administration of intravenous contrast. Multiplanar CT image reconstructions and MIPs were obtained to evaluate the vascular anatomy. Carotid stenosis measurements (when applicable) are obtained utilizing NASCET criteria, using the distal internal carotid diameter as the denominator. CONTRAST:  64mL ISOVUE-370 IOPAMIDOL (ISOVUE-370) INJECTION 76% COMPARISON:  CT earlier same day FINDINGS: CTA NECK FINDINGS Aortic arch: The aortic arch shows atherosclerotic calcification. Branching pattern of the brachiocephalic vessels from the arch is normal. No origin stenosis. Right carotid system: Common carotid artery widely patent to the bifurcation. Mild calcified plaque at the carotid bifurcation but no stenosis or irregularity. Cervical ICA widely patent. Left carotid system: Common carotid artery widely patent to the bifurcation. Minimal atherosclerotic plaque at the carotid bifurcation but no stenosis or irregularity. Cervical ICA is tortuous but widely patent. Vertebral arteries: Both vertebral arteries are approximately equal in size. The origins are widely patent. No stenotic disease in the cervical region. Skeleton: Ordinary mild midcervical spondylosis. Other neck: No mass or lymphadenopathy. Upper chest:  Incidental azygos fissure on the right. Mild pulmonary scarring. No active process evident. Review of the MIP images confirms the above findings CTA HEAD FINDINGS Anterior circulation: Both internal carotid arteries are patent through the skull base and siphon regions. There is atherosclerotic calcification in the carotid siphon regions but no stenosis greater than about 30%. The anterior and middle cerebral vessels are patent without proximal stenosis, aneurysm or vascular malformation. Posterior circulation: Both vertebral arteries are widely  patent to the basilar. No basilar stenosis. Posterior circulation branch vessels are normal. Venous sinuses: Patent and normal. Anatomic variants: None significant Delayed phase: 2 cm pituitary macro adenoma egg and demonstrated. Review of the MIP images confirms the above findings IMPRESSION: No significant vascular finding. Ordinary atherosclerosis. No flow limiting stenosis or significant irregularity. No large or medium vessel occlusion. Redemonstration of 2 cm pituitary macro adenoma. Electronically Signed   By: Nelson Chimes M.D.   On: 12/22/2017 15:45   Ct Angio Neck W Or Wo Contrast  Result Date: 12/22/2017 CLINICAL DATA:  Right-sided facial droop. EXAM: CT ANGIOGRAPHY HEAD AND NECK TECHNIQUE: Multidetector CT imaging of the head and neck was performed using the standard protocol during bolus administration of intravenous contrast. Multiplanar CT image reconstructions and MIPs were obtained to evaluate the vascular anatomy. Carotid stenosis measurements (when applicable) are obtained utilizing NASCET criteria, using the distal internal carotid diameter as the denominator. CONTRAST:  68mL ISOVUE-370 IOPAMIDOL (ISOVUE-370) INJECTION 76% COMPARISON:  CT earlier same day FINDINGS: CTA NECK FINDINGS Aortic arch: The aortic arch shows atherosclerotic calcification. Branching pattern of the brachiocephalic vessels from the arch is normal. No origin stenosis. Right carotid system: Common carotid artery widely patent to the bifurcation. Mild calcified plaque at the carotid bifurcation but no stenosis or irregularity. Cervical ICA widely patent. Left carotid system: Common carotid artery widely patent to the bifurcation. Minimal atherosclerotic plaque at the carotid bifurcation but no stenosis or irregularity. Cervical ICA is tortuous but widely patent. Vertebral arteries: Both vertebral arteries are approximately equal in size. The origins are widely patent. No stenotic disease in the cervical region. Skeleton:  Ordinary mild midcervical spondylosis. Other neck: No mass or lymphadenopathy. Upper chest: Incidental azygos fissure on the right. Mild pulmonary scarring. No active process evident. Review of the MIP images confirms the above findings CTA HEAD FINDINGS Anterior circulation: Both internal carotid arteries are patent through the skull base and siphon regions. There is atherosclerotic calcification in the carotid siphon regions but no stenosis greater than about 30%. The anterior and middle cerebral vessels are patent without proximal stenosis, aneurysm or vascular malformation. Posterior circulation: Both vertebral arteries are widely patent to the basilar. No basilar stenosis. Posterior circulation branch vessels are normal. Venous sinuses: Patent and normal. Anatomic variants: None significant Delayed phase: 2 cm pituitary macro adenoma egg and demonstrated. Review of the MIP images confirms the above findings IMPRESSION: No significant vascular finding. Ordinary atherosclerosis. No flow limiting stenosis or significant irregularity. No large or medium vessel occlusion. Redemonstration of 2 cm pituitary macro adenoma. Electronically Signed   By: Nelson Chimes M.D.   On: 12/22/2017 15:45   Mr Brain Wo Contrast  Result Date: 12/22/2017 CLINICAL DATA:  Facial droop and slurred speech. EXAM: MRI HEAD WITHOUT CONTRAST TECHNIQUE: Multiplanar, multiecho pulse sequences of the brain and surrounding structures were obtained without intravenous contrast. COMPARISON:  CTA head neck 12/22/2017 FINDINGS: BRAIN: Intra sellar mass measures up to 2.2 cm in craniocaudal dimension. There is no acute infarct or acute hemorrhage. No mass  lesion, hydrocephalus, dural abnormality or extra-axial collection. Multifocal white matter hyperintensity, most commonly due to chronic ischemic microangiopathy. There is a small, old infarct in the left cerebellum. No age-advanced or lobar predominant atrophy. No chronic microhemorrhage or  superficial siderosis. VASCULAR: Major intracranial arterial and venous sinus flow voids are preserved. SKULL AND UPPER CERVICAL SPINE: The visualized skull base, calvarium, upper cervical spine and extracranial soft tissues are normal. SINUSES/ORBITS: No fluid levels or advanced mucosal thickening. No mastoid or middle ear effusion. Normal orbits. IMPRESSION: 1. No acute intracranial abnormality. 2. 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 3. Mild chronic ischemic microangiopathy. Small, old left cerebellar infarct. Electronically Signed   By: Ulyses Jarred M.D.   On: 12/22/2017 19:40   Ct Head Code Stroke Wo Contrast  Result Date: 12/22/2017 CLINICAL DATA:  Code stroke.  Right-sided facial droop. EXAM: CT HEAD WITHOUT CONTRAST TECHNIQUE: Contiguous axial images were obtained from the base of the skull through the vertex without intravenous contrast. COMPARISON:  None. FINDINGS: Brain: No evidence of acute infarction, hemorrhage, hydrocephalus, extra-axial collection or mass effect. Generalized atrophy with ventriculomegaly. Mild chronic small vessel ischemic change in the cerebral white matter. 18 mm sellar mass with sellar expansion, usually pituitary adenoma. No destructive changes. Vascular: No hyperdense vessel or unexpected calcification. Skull: Negative Sinuses/Orbits: Bilateral cataract resection. Other: These results were communicated to Dr. Rory Percy at 3:25 pmon 5/13/2019by text page via the [facility] messaging system. ASPECTS [facility] Stroke Program Early CT Score) - Ganglionic level infarction (caudate, lentiform nuclei, internal capsule, insula, M1-M3 cortex): 7 - Supraganglionic infarction (M4-M6 cortex): 3 Total score (0-10 with 10 being normal): 10 IMPRESSION: 1. No acute finding.  ASPECTS is 10. 2. 18 mm mass in the expanded sella, probable pituitary adenoma. 3. Generalized atrophy. Electronically Signed   By: Monte Fantasia M.D.   On: 12/22/2017 15:26    PHYSICAL EXAM HEENT-   Normocephalic, no lesions, without obvious abnormality.  Normal external eye and conjunctiva.   Extremities- Warm, dry and intact Musculoskeletal-no joint tenderness, deformity or swelling Skin-warm and dry, no hyperpigmentation, vitiligo, or suspicious lesions  Neurological Examination Mental Status: Alert, oriented, thought content appropriate.  Speech fluent without evidence of aphasia.  Able to follow 3 step commands without difficulty. Can name, repeat and follow complex commands. Cranial Nerves: II:  Visual fields grossly normal,  III,IV, VI: ptosis not present, extra-ocular motions intact bilaterally, pupils equal, round, reactive to light and accommodation V,VII: smile symmetric but at rest he does have a right facial droop, facial light touch sensation normal bilaterally. Sporadic dysarthria, worse in recumbent position. VIII: hearing normal bilaterally IX,X: uvula rises symmetrically XI: bilateral shoulder shrug XII: midline tongue extension Motor: Right :  Upper extremity   5/5                                      Left:     Upper extremity   5/5             Lower extremity   5/5                                                  Lower extremity   5/5 Tone and bulk:normal tone throughout; no atrophy noted Sensory: Pinprick and light touch intact  throughout, bilaterally Deep Tendon Reflexes: 1+ and symmetric throughout Plantars: Right: downgoing                                Left: downgoing Cerebellar: normal finger-to-nose,  and normal heel-to-shin test Gait: Not tested   ASSESSMENT/PLAN [name] is a 84 y.o. male with history of hypertension, mild dementia, overactive bladder  presenting with transient aphasia.   L brain TIAikely from small vessel disease.  Code Stroke CT head No acute stroke. Atrophy. ASPECTS 10.     CTA head & neck no significant vascular finding. 2cm pituitary macroadenoma  MRI  No acute stroke. 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 Small vessel disease.   2D Echo  pending   LDL 58  HgbA1c 5.6  Lovenox 30 mg sq daily for VTE prophylaxis  No antithrombotic prior to admission, now on aspirin 325 mg daily. Given TIA, will place on aspirin 81 mg and plavix 75 mg daily x 3 weeks, then aspirin alone. Orders adjusted.   Therapy recommendations:  No therapy needs  Disposition:  Return to [facility] in [place] for d/c once 2D completed  Follow up with neuro in 4 weeks. Order placed  Hypertension  Stable . BP goal normotensive  Hyperlipidemia  Home meds:  lipitor 10, increased to lipitor 80 on admission  LDL 58, goal < 70  Given low LDL on lipitor 10, adjusted dose back there  Continue statin at discharge   Other Stroke Risk Factors  Advanced age  Pipe smoker, advised to stop smoking  Other Active Problems  Mild cognitive decline, on Aricept. continue at d/c  Overactive bladder  Pituitary mass, stable  Hospital day # 0  Burnetta Sabin, MSN, APRN, ANVP-BC, AGPCNP-BC Advanced Practice Stroke Nurse [place] for Schedule & Pager information 12/23/2017 11:13 AM  I have personally examined this patient, reviewed notes, independently viewed imaging studies, participated in medical decision making and plan of care.ROS completed by me personally and pertinent positives fully documented  I have made any additions or clarifications directly to the above note. Agree with note above.  He has presented with a TIA secondary to small vessel disease. Recommend dual antiplatelet therapy for 3 weeks followed by aspirin alone. He has a stable pituitary lesion which does not require intervention at the present time. Long discussion with the patient, son and daughter-in-law and answered questions. Patient was given an opportunity to consider participation in the AXIOMATIC    stroke prevention trial but he declined. Greater than 50% time during this 25 minute visit was spent on counseling and  coordination of care about his TIA and answering questions.  Antony Contras, MD Medical Director [place] Pager: [phone] 12/23/2017 2:53 PM  To contact Stroke Continuity provider, please refer to [email or web address] After hours, contact General Neurology

## 2017-12-23 NOTE — Progress Notes (Signed)
Pt and family given d/c instructions, able to teach back and verbalize understanding. No new questions or concerns. IV and tele removed. Discharge summary given. Prescriptions and DNR form given to family. Son to transport to facility. D/C from unit in wheelchair per staff. Echo results pending, MD to call results to PCP.

## 2017-12-23 NOTE — Evaluation (Signed)
Physical Therapy Evaluation Patient Details Name: Billy [name] MRN: [id number] DOB: [birth year] Today's Date: 12/23/2017   History of Present Illness  84 y.o.malewith medical history significanthypertension, mild dementia, overactive bladder presents to the emergency Department chief complaint facial droop and slurred speech. Neuro work up underway.  Clinical Impression  Orders received for PT evaluation. Patient demonstrates modest deficits in functional mobility as indicated below, but overall appears to be at his functional baseline per PLOF reports from patient and son. At this time, no further acute PT needs. Will sign off.    Follow Up Recommendations (return to ALF)    Equipment Recommendations  None recommended by PT    Recommendations for Other Services       Precautions / Restrictions Precautions Precautions: Fall      Mobility  Bed Mobility Overal bed mobility: Modified Independent             General bed mobility comments: increased time to perform, no physical assist required  Transfers Overall transfer level: Needs assistance Equipment used: Straight cane Transfers: Sit to/from Stand Sit to Stand: Supervision         General transfer comment: no physical assist required, no overt sway or LOB. Supervision for safety upon standing initally but not required  Ambulation/Gait Ambulation/Gait assistance: Supervision Ambulation Distance (Feet): 240 Feet Assistive device: Straight cane Gait Pattern/deviations: Step-through pattern;Decreased stride length;Drifts right/left Gait velocity: decreased Gait velocity interpretation: <1.8 ft/sec, indicate of risk for recurrent falls General Gait Details: steady with ambulation. No physical assist. supervision for safety  Stairs            Wheelchair Mobility    Modified Rankin (Stroke Patients Only) Modified Rankin (Stroke Patients Only) Pre-Morbid Rankin Score: No symptoms Modified Rankin:  Moderate disability     Balance Overall balance assessment: Needs assistance Sitting-balance support: Feet supported Sitting balance-Leahy Scale: Good     Standing balance support: During functional activity Standing balance-Leahy Scale: Fair Standing balance comment: able to release UE support             High level balance activites: Side stepping;Backward walking;Direction changes;Turns;Head turns;Sudden stops High Level Balance Comments: min guard to supervision. no physical assist required             Pertinent Vitals/Pain Pain Assessment: No/denies pain    Home Living Family/patient expects to be discharged to:: Assisted living Living Arrangements: Other (Comment)(assisted living) Available Help at Discharge: Available 24 hours/day Type of Home: Assisted living Home Access: Level entry;Elevator     Home Layout: One level Home Equipment: Cane - single point;Grab bars - toilet;Grab bars - tub/shower      Prior Function Level of Independence: Independent with assistive device(s);Needs assistance   Gait / Transfers Assistance Needed: modified independent with cane     Comments: went to dining hall for measl 3x day and has assist to change the linens. Reports he does all his dressing and bathing himself     Hand Dominance   Dominant Hand: Right    Extremity/Trunk Assessment   Upper Extremity Assessment Upper Extremity Assessment: Overall WFL for tasks assessed    Lower Extremity Assessment Lower Extremity Assessment: Overall WFL for tasks assessed       Communication   Communication: (modestly slurred speach)  Cognition Arousal/Alertness: Awake/alert Behavior During Therapy: WFL for tasks assessed/performed Overall Cognitive Status: Within Functional Limits for tasks assessed  General Comments      Exercises     Assessment/Plan    PT Assessment Patent does not need any further PT  services  PT Problem List Decreased activity tolerance;Decreased balance;Decreased mobility       PT Treatment Interventions      PT Goals (Current goals can be found in the Care Plan section)  Acute Rehab PT Goals Patient Stated Goal: to go home PT Goal Formulation: With patient/family Time For Goal Achievement: 01/06/18 Potential to Achieve Goals: Good    Frequency     Barriers to discharge        Co-evaluation               AM-PAC PT "6 Clicks" Daily Activity  Outcome Measure Difficulty turning over in bed (including adjusting bedclothes, sheets and blankets)?: A Little Difficulty moving from lying on back to sitting on the side of the bed? : A Little Difficulty sitting down on and standing up from a chair with arms (e.g., wheelchair, bedside commode, etc,.)?: A Little Help needed moving to and from a bed to chair (including a wheelchair)?: A Little Help needed walking in hospital room?: A Little Help needed climbing 3-5 steps with a railing? : A Little 6 Click Score: 18    End of Session   Activity Tolerance: Patient tolerated treatment well Patient left: in bed(sitting EOB with OT) Nurse Communication: Mobility status PT Visit Diagnosis: Other symptoms and signs involving the nervous system (Q98.264)    Time: 1583-0940 PT Time Calculation (min) (ACUTE ONLY): 18 min   Charges:   PT Evaluation $PT Eval Low Complexity: 1 Low     PT G Codes:        Alben Deeds, PT DPT  Board Certified Neurologic Specialist [place] 12/23/2017, 9:04 AM

## 2017-12-23 NOTE — Evaluation (Signed)
Occupational Therapy Evaluation Patient Details Name: Billy [name] MRN: [id number] DOB: [birth year] Today's Date: 12/23/2017    History of Present Illness 84 y.o.malewith medical history significanthypertension, mild dementia, overactive bladder presents to the emergency Department chief complaint facial droop and slurred speech. Neuro work up underway.   Clinical Impression   This 84 y/o male presents with the above. Pt resides in ALF, reports mod independence with ADLs and functional mobility using SPC, ambulates to dining hall for meals in facility. Pt completing functional mobility using SPC with minguard assist this session; completing standing grooming ADLs with supervision and demonstrating UB/LB ADLs with minguard assist. Pt appears to be at his baseline regarding ADL and mobility completion. Education provided and questions answered throughout. Feel pt is safe to return to ALF from OT standpoint once medically ready. No further acute OT needs identified at this time. Will sign off.     Follow Up Recommendations  No OT follow up;Supervision - Intermittent    Equipment Recommendations  None recommended by OT           Precautions / Restrictions Precautions Precautions: Fall Restrictions Weight Bearing Restrictions: No      Mobility Bed Mobility Overal bed mobility: Modified Independent             General bed mobility comments: increased time to perform, no physical assist required  Transfers Overall transfer level: Needs assistance Equipment used: Straight cane Transfers: Sit to/from Stand Sit to Stand: Supervision         General transfer comment: supervision for safety     Balance Overall balance assessment: Needs assistance Sitting-balance support: Feet supported Sitting balance-Leahy Scale: Good     Standing balance support: During functional activity;Single extremity supported Standing balance-Leahy Scale: Fair Standing balance comment: able  to maintain static standing without UE support              High level balance activites: Side stepping;Backward walking;Direction changes;Turns;Head turns;Sudden stops High Level Balance Comments: min guard to supervision. no physical assist required           ADL either performed or assessed with clinical judgement   ADL Overall ADL's : Needs assistance/impaired Eating/Feeding: Modified independent;Sitting   Grooming: Wash/dry face;Supervision/safety;Standing Grooming Details (indicate cue type and reason): standing at sink level  Upper Body Bathing: Supervision/ safety;Sitting   Lower Body Bathing: Supervison/ safety;Sit to/from stand   Upper Body Dressing : Min guard;Sitting Upper Body Dressing Details (indicate cue type and reason): assist for lines to doff/don new gown this session  Lower Body Dressing: Min guard;Sit to/from stand Lower Body Dressing Details (indicate cue type and reason): pt able to reach and adjust socks sitting EOB  Toilet Transfer: Supervision/safety;Ambulation Toilet Transfer Details (indicate cue type and reason): using SPC; simulated in transfer to/from Becker and Hygiene: Supervision/safety;Sit to/from stand       Functional mobility during ADLs: Supervision/safety;Min guard;Cane       Vision Baseline Vision/History: Cataracts;Wears glasses Wears Glasses: Reading only Patient Visual Report: No change from baseline Vision Assessment?: No apparent visual deficits                Pertinent Vitals/Pain Pain Assessment: No/denies pain     Hand Dominance Right   Extremity/Trunk Assessment Upper Extremity Assessment Upper Extremity Assessment: Overall WFL for tasks assessed   Lower Extremity Assessment Lower Extremity Assessment: Overall WFL for tasks assessed       Communication Communication Communication: (modestly slurred speech )   Cognition Arousal/Alertness:  Awake/alert Behavior  During Therapy: WFL for tasks assessed/performed Overall Cognitive Status: Within Functional Limits for tasks assessed                                 General Comments: hx of mild dementia at baseline, cognition overall appears WFL during functional task completion this session, suspect pt may be at his baseline regarding ADL completion    General Comments                  Home Living Family/patient expects to be discharged to:: Assisted living Living Arrangements: Other (Comment)(assisted living ) Available Help at Discharge: Available 24 hours/day Type of Home: Assisted living Home Access: Level entry;Elevator     Home Layout: One level     Bathroom Shower/Tub: Occupational psychologist: Handicapped height Bathroom Accessibility: Yes   Home Equipment: [place] - single point;Grab bars - toilet;Grab bars - tub/shower          Prior Functioning/Environment Level of Independence: Independent with assistive device(s);Needs assistance  Gait / Transfers Assistance Needed: modified independent with cane     Comments: went to dining hall for measl 3x day and has assist to change the linens. Reports he does all his dressing and bathing himself        OT Problem List: Impaired balance (sitting and/or standing);Decreased activity tolerance      OT Treatment/Interventions: Self-care/ADL training;DME and/or AE instruction;Therapeutic activities;Balance training;Therapeutic exercise;Patient/family education    OT Goals(Current goals can be found in the care plan section) Acute Rehab OT Goals Patient Stated Goal: to go home OT Goal Formulation: All assessment and education complete, DC therapy                                 AM-PAC PT "6 Clicks" Daily Activity     Outcome Measure Help from another person eating meals?: None Help from another person taking care of personal grooming?: A Little Help from another person toileting, which includes  using toliet, bedpan, or urinal?: A Little Help from another person bathing (including washing, rinsing, drying)?: A Little Help from another person to put on and taking off regular upper body clothing?: None Help from another person to put on and taking off regular lower body clothing?: A Little 6 Click Score: 20   End of Session Equipment Utilized During Treatment: Other (comment)(SPC) Nurse Communication: Mobility status  Activity Tolerance: Patient tolerated treatment well Patient left: in bed;with call bell/phone within reach;with bed alarm set;with nursing/sitter in room  OT Visit Diagnosis: Other symptoms and signs involving the nervous system (T24.580)                Time: 9983-3825 OT Time Calculation (min): 15 min Charges:  OT General Charges $OT Visit: 1 Visit OT Evaluation $OT Eval Moderate Complexity: 1 Mod G-Codes:     Lou Cal, OT Pager [phone] 12/23/2017   Billy [name] 12/23/2017, 9:14 AM

## 2017-12-23 NOTE — Progress Notes (Signed)
  Echocardiogram 2D Echocardiogram has been performed.  Jannett Celestine 12/23/2017, 1:46 PM

## 2017-12-23 NOTE — Clinical Social Work Note (Signed)
Clinical Social Work Assessment  Patient Details  Name: Billy [name] MRN: [id number] Date of Birth: 04/19/1927  Date of referral:  12/23/17               Reason for consult:  Discharge Planning                Permission sought to share information with:  Facility Sport and exercise psychologist, Family Supports Permission granted to share information::  Yes, Verbal Permission Granted  Name::     [employer]  Agency::  [facility]  Relationship::  Son  Contact Information:  [phone]  Housing/Transportation Living arrangements for the past 2 months:  [place] of Information:  Patient Patient Interpreter Needed:  None Criminal Activity/Legal Involvement Pertinent to Current Situation/Hospitalization:  No - Comment as needed Significant Relationships:  Adult Children Lives with:  Facility Resident Do you feel safe going back to the place where you live?  Yes Need for family participation in patient care:  No (Coment)  Care giving concerns:  CSW received consult regarding discharge planning. Patient resides at [place] and will return there at discharge. CSW to continue to follow and assist with discharge planning needs.   Social Worker assessment / plan:  CSW spoke with patient concerning return to ALF.   Employment status:  Retired Forensic scientist:  Medicare PT Recommendations:  No Follow Up Information / Referral to community resources:     Patient/Family's Response to care:  Patient reports agreement with discharge plan back to ALF.   Patient/Family's Understanding of and Emotional Response to Diagnosis, Current Treatment, and Prognosis:  Patient/family is realistic regarding therapy needs and expressed being hopeful for return to ALF placement. Patient expressed understanding of CSW role and discharge process as well as medical condition. No questions/concerns about plan or treatment.    Emotional Assessment Appearance:  Appears stated  age Attitude/Demeanor/Rapport:  Gracious Affect (typically observed):  Accepting, Appropriate Orientation:  Oriented to Self, Oriented to Situation, Oriented to Place, Oriented to  Time Alcohol / Substance use:  Not Applicable Psych involvement (Current and /or in the community):  No (Comment)  Discharge Needs  Concerns to be addressed:  Care Coordination Readmission within the last 30 days:  No Current discharge risk:  None Barriers to Discharge:  No Barriers Identified   Benard Halsted, [place] 12/23/2017, 2:08 PM

## 2017-12-23 NOTE — Progress Notes (Signed)
Patient will DC to: [facility] Anticipated DC date: 12/23/17 Family notified: Son Transport by: Son by car   Per MD patient ready for DC to ALF. RN, patient, patient's family, and facility notified of DC. Discharge Summary, FL2, and script sent to facility. RN given number for report [phone]).  CSW signing off.  Cedric Fishman, LCSW Clinical Social Worker [phone]

## 2017-12-23 NOTE — Discharge Summary (Signed)
Physician Discharge Summary  Billy [name] LZJ:[id number] DOB: [birth year] DOA: 12/22/2017  PCP: Reymundo Poll, MD  Admit date: 12/22/2017 Discharge date: 12/23/2017   Recommendations for Outpatient Follow-Up:   1. Echo results pending 2. Asa/plavix for 3 weeks then ASA alone 3. Neurology follow up 4. DNR   Discharge Diagnosis:   Principal Problem:   Stroke-like symptoms Active Problems:   Overactive bladder   Hypertension   Hypercholesterolemia   Dementia   Pituitary mass [facility])   Discharge disposition:  alf  Discharge Condition: Improved.  Diet recommendation: Low sodium, heart healthy..  Wound care: None.   History of Present Illness:   Billy [name] is a delightful 84 y.o. male with medical history significant hypertension, mild dementia, overactive bladder presents to the emergency Department chief complaint facial droop and slurred speech. Code stroke was called and patient evaluated by neurology who opined TIA and recommended admission with stroke workup. [facility] asked to admit  Information is obtained from the patient and the chart noting that information from patient may be unreliable secondary to mild dementia. Patient states he was walking down the hall on his way to lunch he developed "difficulty speaking". He was aware he was not making sense and understood that people around him were concerned. Reportedly during this time he also had a facial droop. He denies any headache blurred vision dizziness syncope or near-syncope. He denies any chest pain palpitations shortness of breath difficulty chewing or swallowing. He denies abdominal pain nausea vomiting lower extremity edema. He does report a chronic cough that is nonproductive. He denies dysuria hematuria frequency or urgency     Hospital Course by Problem:   L brain TIA  CTA head & neck no significant vascular finding. 2cm pituitary macroadenoma  MRI  No acute stroke. 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 Small vessel disease.   2D Echo-- read still pending  LDL 58-- on statin  HgbA1c 5.6  Per neuro: aspirin 81 mg and plavix 75 mg daily x 3 weeks, then aspirin alone.  Hypertension  Resume home meds  Hyperlipidemia  LDL 58, goal < 70  Continue home lipitor        Medical Consultants:    Neuro   Discharge Exam:   Vitals:   12/23/17 0805 12/23/17 1151  BP: (!) 153/68 (!) 145/70  Pulse: 66 62  Resp: 20 16  Temp: 97.7 F (36.5 C) 97.6 F (36.4 C)  SpO2: 97% 96%   Vitals:   12/23/17 0220 12/23/17 0420 12/23/17 0805 12/23/17 1151  BP: 134/64 131/67 (!) 153/68 (!) 145/70  Pulse: 80 62 66 62  Resp: 20 20 20 16   Temp:  98 F (36.7 C) 97.7 F (36.5 C) 97.6 F (36.4 C)  TempSrc:  Oral Oral Oral  SpO2: 96% 93% 97% 96%    Gen:  NAD-- feels back to baseline  The results of significant diagnostics from this hospitalization (including imaging, microbiology, ancillary and laboratory) are listed below for reference.     Procedures and Diagnostic Studies:   Ct Angio Head W Or Wo Contrast  Result Date: 12/22/2017 CLINICAL DATA:  Right-sided facial droop. EXAM: CT ANGIOGRAPHY HEAD AND NECK TECHNIQUE: Multidetector CT imaging of the head and neck was performed using the standard protocol during bolus administration of intravenous contrast. Multiplanar CT image reconstructions and MIPs were obtained to evaluate the vascular anatomy. Carotid stenosis measurements (when applicable) are obtained utilizing NASCET criteria, using the distal internal carotid diameter as the denominator. CONTRAST:  66mL ISOVUE-370 IOPAMIDOL (ISOVUE-370) INJECTION 76% COMPARISON:  CT earlier same day FINDINGS: CTA NECK FINDINGS Aortic arch: The aortic arch shows atherosclerotic calcification. Branching pattern of the brachiocephalic vessels from the arch is normal. No origin stenosis. Right carotid system: Common carotid artery widely patent to the  bifurcation. Mild calcified plaque at the carotid bifurcation but no stenosis or irregularity. Cervical ICA widely patent. Left carotid system: Common carotid artery widely patent to the bifurcation. Minimal atherosclerotic plaque at the carotid bifurcation but no stenosis or irregularity. Cervical ICA is tortuous but widely patent. Vertebral arteries: Both vertebral arteries are approximately equal in size. The origins are widely patent. No stenotic disease in the cervical region. Skeleton: Ordinary mild midcervical spondylosis. Other neck: No mass or lymphadenopathy. Upper chest: Incidental azygos fissure on the right. Mild pulmonary scarring. No active process evident. Review of the MIP images confirms the above findings CTA HEAD FINDINGS Anterior circulation: Both internal carotid arteries are patent through the skull base and siphon regions. There is atherosclerotic calcification in the carotid siphon regions but no stenosis greater than about 30%. The anterior and middle cerebral vessels are patent without proximal stenosis, aneurysm or vascular malformation. Posterior circulation: Both vertebral arteries are widely patent to the basilar. No basilar stenosis. Posterior circulation branch vessels are normal. Venous sinuses: Patent and normal. Anatomic variants: None significant Delayed phase: 2 cm pituitary macro adenoma egg and demonstrated. Review of the MIP images confirms the above findings IMPRESSION: No significant vascular finding. Ordinary atherosclerosis. No flow limiting stenosis or significant irregularity. No large or medium vessel occlusion. Redemonstration of 2 cm pituitary macro adenoma. Electronically Signed   By: Nelson Chimes M.D.   On: 12/22/2017 15:45   Ct Angio Neck W Or Wo Contrast  Result Date: 12/22/2017 CLINICAL DATA:  Right-sided facial droop. EXAM: CT ANGIOGRAPHY HEAD AND NECK TECHNIQUE: Multidetector CT imaging of the head and neck was performed using the standard protocol during  bolus administration of intravenous contrast. Multiplanar CT image reconstructions and MIPs were obtained to evaluate the vascular anatomy. Carotid stenosis measurements (when applicable) are obtained utilizing NASCET criteria, using the distal internal carotid diameter as the denominator. CONTRAST:  58mL ISOVUE-370 IOPAMIDOL (ISOVUE-370) INJECTION 76% COMPARISON:  CT earlier same day FINDINGS: CTA NECK FINDINGS Aortic arch: The aortic arch shows atherosclerotic calcification. Branching pattern of the brachiocephalic vessels from the arch is normal. No origin stenosis. Right carotid system: Common carotid artery widely patent to the bifurcation. Mild calcified plaque at the carotid bifurcation but no stenosis or irregularity. Cervical ICA widely patent. Left carotid system: Common carotid artery widely patent to the bifurcation. Minimal atherosclerotic plaque at the carotid bifurcation but no stenosis or irregularity. Cervical ICA is tortuous but widely patent. Vertebral arteries: Both vertebral arteries are approximately equal in size. The origins are widely patent. No stenotic disease in the cervical region. Skeleton: Ordinary mild midcervical spondylosis. Other neck: No mass or lymphadenopathy. Upper chest: Incidental azygos fissure on the right. Mild pulmonary scarring. No active process evident. Review of the MIP images confirms the above findings CTA HEAD FINDINGS Anterior circulation: Both internal carotid arteries are patent through the skull base and siphon regions. There is atherosclerotic calcification in the carotid siphon regions but no stenosis greater than about 30%. The anterior and middle cerebral vessels are patent without proximal stenosis, aneurysm or vascular malformation. Posterior circulation: Both vertebral arteries are widely patent to the basilar. No basilar stenosis. Posterior circulation branch vessels are normal. Venous sinuses: Patent and normal. Anatomic variants:  None significant  Delayed phase: 2 cm pituitary macro adenoma egg and demonstrated. Review of the MIP images confirms the above findings IMPRESSION: No significant vascular finding. Ordinary atherosclerosis. No flow limiting stenosis or significant irregularity. No large or medium vessel occlusion. Redemonstration of 2 cm pituitary macro adenoma. Electronically Signed   By: Nelson Chimes M.D.   On: 12/22/2017 15:45   Mr Brain Wo Contrast  Result Date: 12/22/2017 CLINICAL DATA:  Facial droop and slurred speech. EXAM: MRI HEAD WITHOUT CONTRAST TECHNIQUE: Multiplanar, multiecho pulse sequences of the brain and surrounding structures were obtained without intravenous contrast. COMPARISON:  CTA head neck 12/22/2017 FINDINGS: BRAIN: Intra sellar mass measures up to 2.2 cm in craniocaudal dimension. There is no acute infarct or acute hemorrhage. No mass lesion, hydrocephalus, dural abnormality or extra-axial collection. Multifocal white matter hyperintensity, most commonly due to chronic ischemic microangiopathy. There is a small, old infarct in the left cerebellum. No age-advanced or lobar predominant atrophy. No chronic microhemorrhage or superficial siderosis. VASCULAR: Major intracranial arterial and venous sinus flow voids are preserved. SKULL AND UPPER CERVICAL SPINE: The visualized skull base, calvarium, upper cervical spine and extracranial soft tissues are normal. SINUSES/ORBITS: No fluid levels or advanced mucosal thickening. No mastoid or middle ear effusion. Normal orbits. IMPRESSION: 1. No acute intracranial abnormality. 2. 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 3. Mild chronic ischemic microangiopathy. Small, old left cerebellar infarct. Electronically Signed   By: Ulyses Jarred M.D.   On: 12/22/2017 19:40   Ct Head Code Stroke Wo Contrast  Result Date: 12/22/2017 CLINICAL DATA:  Code stroke.  Right-sided facial droop. EXAM: CT HEAD WITHOUT CONTRAST TECHNIQUE: Contiguous axial images were obtained from  the base of the skull through the vertex without intravenous contrast. COMPARISON:  None. FINDINGS: Brain: No evidence of acute infarction, hemorrhage, hydrocephalus, extra-axial collection or mass effect. Generalized atrophy with ventriculomegaly. Mild chronic small vessel ischemic change in the cerebral white matter. 18 mm sellar mass with sellar expansion, usually pituitary adenoma. No destructive changes. Vascular: No hyperdense vessel or unexpected calcification. Skull: Negative Sinuses/Orbits: Bilateral cataract resection. Other: These results were communicated to Dr. Rory Percy at 3:25 pmon 5/13/2019by text page via the [facility] messaging system. ASPECTS [facility] Stroke Program Early CT Score) - Ganglionic level infarction (caudate, lentiform nuclei, internal capsule, insula, M1-M3 cortex): 7 - Supraganglionic infarction (M4-M6 cortex): 3 Total score (0-10 with 10 being normal): 10 IMPRESSION: 1. No acute finding.  ASPECTS is 10. 2. 18 mm mass in the expanded sella, probable pituitary adenoma. 3. Generalized atrophy. Electronically Signed   By: Monte Fantasia M.D.   On: 12/22/2017 15:26     Labs:   Basic Metabolic Panel: Recent Labs  Lab 12/22/17 1515 12/22/17 1518  NA 136 138  K 4.1 4.1  CL 104 103  CO2 23  --   GLUCOSE 117* 115*  BUN 25* 27*  CREATININE 1.01 0.90  CALCIUM 9.1  --    GFR CrCl cannot be calculated (Unknown ideal weight.). Liver Function Tests: Recent Labs  Lab 12/22/17 1515  AST 29  ALT 20  ALKPHOS 77  BILITOT 0.3  PROT 6.9  ALBUMIN 3.4*   No results for input(s): LIPASE, AMYLASE in the last 168 hours. No results for input(s): AMMONIA in the last 168 hours. Coagulation profile Recent Labs  Lab 12/22/17 1515  INR 1.00    CBC: Recent Labs  Lab 12/22/17 1515 12/22/17 1518  WBC 7.5  --   NEUTROABS 3.8  --   HGB 12.8*  12.6*  HCT 37.7* 37.0*  MCV 91.5  --   PLT 198  --    Cardiac Enzymes: No results for input(s): CKTOTAL, CKMB, CKMBINDEX, TROPONINI in  the last 168 hours. BNP: Invalid input(s): POCBNP CBG: Recent Labs  Lab 12/22/17 1512  GLUCAP 111*   D-Dimer No results for input(s): DDIMER in the last 72 hours. Hgb A1c Recent Labs    12/22/17 1826  HGBA1C 5.6   Lipid Profile Recent Labs    12/22/17 1826  CHOL 159  HDL 44  LDLCALC 58  TRIG 284*  CHOLHDL 3.6   Thyroid function studies Recent Labs    12/22/17 1826  TSH 2.493   Anemia work up No results for input(s): VITAMINB12, FOLATE, FERRITIN, TIBC, IRON, RETICCTPCT in the last 72 hours. Microbiology Recent Results (from the past 240 hour(s))  MRSA PCR Screening     Status: None   Collection Time: 12/23/17 10:00 AM  Result Value Ref Range Status   MRSA by PCR NEGATIVE NEGATIVE Final    Comment:        The GeneXpert MRSA Assay (FDA approved for NASAL specimens only), is one component of a comprehensive MRSA colonization surveillance program. It is not intended to diagnose MRSA infection nor to guide or monitor treatment for MRSA infections. Performed at [facility], [place] [address], [place],  [address]      Discharge Instructions:   Discharge Instructions    Ambulatory referral to Neurology   Complete by:  As directed    Follow up with stroke clinic NP (Evyn Kooyman Vanschaick or Cecille Rubin, if both not available, consider Dr. Antony Contras, Dr. Bess Harvest, or Dr. Sarina Ill) at [facility] in about 4 weeks.   Diet - low sodium heart healthy   Complete by:  As directed    Discharge instructions   Complete by:  As directed    ASA 81 mg plus plavix x 3 weeks and the ASA 81 mg ALONE   Increase activity slowly   Complete by:  As directed      Allergies as of 12/23/2017   No Known Allergies     Medication List    TAKE these medications   acetaminophen 650 MG CR tablet Commonly known as:  TYLENOL Take 650 mg by mouth daily.   aspirin 81 MG EC tablet Take 1 tablet (81 mg total) by mouth daily. Start  taking on:  12/24/2017   atorvastatin 10 MG tablet Commonly known as:  LIPITOR Take 10 mg by mouth at bedtime.   CENTRUM SILVER 50+MEN PO Take 1 tablet by mouth daily.   PRESERVISION AREDS 2+MULTI VIT PO Take 1 capsule by mouth 2 (two) times daily.   clopidogrel 75 MG tablet Commonly known as:  PLAVIX Take 1 tablet (75 mg total) by mouth daily. Start taking on:  12/24/2017   donepezil 10 MG tablet Commonly known as:  ARICEPT Take 10 mg by mouth at bedtime.   metoprolol tartrate 25 MG tablet Commonly known as:  LOPRESSOR Take 12.5 mg by mouth daily.   mirabegron ER 50 MG Tb24 tablet Commonly known as:  MYRBETRIQ Take 50 mg by mouth daily.   traZODone 50 MG tablet Commonly known as:  DESYREL Take 50 mg by mouth at bedtime.      Follow-up Information    [facility] Follow up in 4 week(s).   Specialty:  Neurology Why:  stroke clinic. office will call with appt date and time. anticipate it  will be a 1 time visit.  if you prefer another neurologist in your area, that is acceptable. Contact information: [address] [place] [phone]       Reymundo Poll, MD Follow up.   Specialty:  Family Medicine Contact information: [place] [place] [address] [phone]            Time coordinating discharge: 25 min  Signed:  Geradine Girt   [facility] 12/23/2017, 1:41 PM

## 2017-12-24 LAB — PROLACTIN: Prolactin: 8.1 ng/mL (ref 4.0–15.2)

## 2018-01-21 ENCOUNTER — Ambulatory Visit (INDEPENDENT_AMBULATORY_CARE_PROVIDER_SITE_OTHER): Payer: Medicare Other | Admitting: Adult Health

## 2018-01-21 ENCOUNTER — Encounter: Payer: Self-pay | Admitting: Adult Health

## 2018-01-21 VITALS — BP 115/61 | HR 62 | Ht 70.0 in | Wt 204.4 lb

## 2018-01-21 DIAGNOSIS — E7800 Pure hypercholesterolemia, unspecified: Secondary | ICD-10-CM | POA: Diagnosis not present

## 2018-01-21 DIAGNOSIS — I10 Essential (primary) hypertension: Secondary | ICD-10-CM

## 2018-01-21 DIAGNOSIS — G459 Transient cerebral ischemic attack, unspecified: Secondary | ICD-10-CM | POA: Diagnosis not present

## 2018-01-21 NOTE — Patient Instructions (Addendum)
Continue aspirin 81 mg daily  and lipitor  for secondary stroke prevention  Ensure you are not taking Plavix any longer as you have completed 3 week course  Continue to follow up with PCP regarding cholesterol and hyperlipidema management  Continue to monitor blood pressure at home  Maintain strict control of hypertension with blood pressure goal below 130/90, diabetes with hemoglobin A1c goal below 6.5% and cholesterol with LDL cholesterol (bad cholesterol) goal below 70 mg/dL. I also advised the patient to eat a healthy diet with plenty of whole grains, cereals, fruits and vegetables, exercise regularly and maintain ideal body weight.  Followup in the future with me in 4 months or call earlier if needed        Thank you for coming to see [place] at [facility]. I hope we have been able to provide you high quality care today.  You may receive a patient satisfaction survey over the next few weeks. We would appreciate your feedback and comments so that we may continue to improve ourselves and the health of our patients.

## 2018-01-21 NOTE — Progress Notes (Signed)
[facility] [address] [place]. [place] [address] [phone]       OFFICE FOLLOW UP NOTE  [name] Date of Birth:  10-11-1926 Medical Record Number:  [id number]   Reason for Referral:  hospital TIA follow up  CHIEF COMPLAINT:  Chief Complaint  Patient presents with  . Follow-up    TIA follow up pt seen by Dr. Leonie Man in the hospital pt is in room 9 with Vinnie Level daughter in law, Patient is at [facility] facility    HPI: Billy [name] is being seen today for initial visit in the office for TIA on 12/22/2017. History obtained from Patient and chart review. Reviewed all radiology images and labs personally.  Billy [name] is a 84 y.o. male with history of hypertension, mild dementia, overactive bladder   who presented with transient aphasia.   CT head reviewed and showed no acute abnormality.  MRA head reviewed and showed no acute stroke.  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  2D echo showed an EF of 60 to 65%.  A1c satisfactory at 5.6.  LDL 58 and as patient was on Lipitor 10 mg PTA it was recommended to continue current dose.  Patient was not on antithrombotic prior to admission but recommended DAPT for 3 weeks and then aspirin alone.  Patient is being seen today for hospital follow-up and is accompanied by his daughter in law.  Overall he is doing well with all symptoms resolved.  He is currently living at [facility] assisted living facility.  It was recommended at discharge to continue aspirin and Plavix for 3 weeks and then stop Plavix and continue aspirin alone.  His patient is an assisted living facility and papers were not sent, it is unclear whether patient is taking both aspirin and Plavix at this time.  It was placed on discharge paperwork to stop Plavix after 3 weeks but daughter will ensure this happened.  Patient denies bleeding or bruising.  Continues to take Lipitor without  side effects of myalgias.  Blood pressure today 115/61.  PCP prescribes his Aricept 10 mg daily and denies decline in memory or concerns.  Patient has recently started physical therapy due to left hip weakness 3 times per week and does use a cane for balance.  Denies new or worsening stroke/TIA symptoms.  ROS:   14 system review of systems performed and negative with exception of cough, incontinence, and insomnia  PMH:  Past Medical History:  Diagnosis Date  . Hearing loss    hearing aids to both ears  . Hypercholesterolemia   . Hypertension   . Overactive bladder   . 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[place])   . Stroke [facility])     PSH: History reviewed. No pertinent surgical history.  Social History:  Social History   Socioeconomic History  . Marital status: Widowed    Spouse name: Not on file  . Number of children: Not on file  . Years of education: Not on file  . Highest education level: Not on file  Occupational History  . Not on file  Social Needs  . Financial resource strain: Not on file  . Food insecurity:    Worry: Not on file    Inability: Not on file  . Transportation needs:    Medical: Not on file    Non-medical: Not on file  Tobacco Use  . Smoking status: Former Research scientist (life sciences)  . Smokeless tobacco: Never Used  Substance and Sexual Activity  . Alcohol use: Not Currently  . Drug use: Not Currently  . Sexual activity: Not on file  Lifestyle  . Physical activity:    Days per week: Not on file    Minutes per session: Not on file  . Stress: Not on file  Relationships  . Social connections:    Talks on phone: Not on file    Gets together: Not on file    Attends religious service: Not on file    Active member of club or organization: Not on file    Attends meetings of clubs or organizations: Not on file    Relationship status: Not on file  . Intimate partner violence:    Fear of current or ex partner: Not on file    Emotionally abused: Not on file    Physically abused: Not on file      Forced sexual activity: Not on file  Other Topics Concern  . Not on file  Social History Narrative  . Not on file    Family History:  Family History  Problem Relation Age of Onset  . Hypertension Mother   . Hypertension Father     Medications:   Current Outpatient Medications on File Prior to Visit  Medication Sig Dispense Refill  . acetaminophen (TYLENOL) 650 MG CR tablet Take 650 mg by mouth daily.    Marland Kitchen aspirin EC 81 MG EC tablet Take 1 tablet (81 mg total) by mouth daily.    Marland Kitchen atorvastatin (LIPITOR) 10 MG tablet Take 10 mg by mouth at bedtime.    . donepezil (ARICEPT) 10 MG tablet Take 10 mg by mouth at bedtime.    . metoprolol tartrate (LOPRESSOR) 25 MG tablet Take 12.5 mg by mouth daily.    . mirabegron ER (MYRBETRIQ) 50 MG TB24 tablet Take 50 mg by mouth daily.    . Multiple Vitamins-Minerals (CENTRUM SILVER 50+MEN PO) Take 1 tablet by mouth daily.    . Multiple Vitamins-Minerals (PRESERVISION AREDS 2+MULTI VIT PO) Take 1 capsule by mouth 2 (two) times daily.    . traZODone (DESYREL) 50 MG tablet Take 50 mg by mouth at bedtime.     No current facility-administered medications on file prior to visit.     Allergies:  No Known Allergies   Physical Exam  Vitals:   01/21/18 1336  BP: 115/61  Pulse: 62  Weight: 204 lb 6.4 oz (92.7 kg)  Height: 5\' 10"  (1.778 m)   Body mass index is 29.33 kg/m. No exam data present  General: well developed, pleasant elderly Caucasian male, well nourished, seated, in no evident distress Head: head normocephalic and atraumatic.   Neck: supple with no carotid or supraclavicular bruits Cardiovascular: regular rate and rhythm, no murmurs Musculoskeletal: no deformity Skin:  no rash/petichiae Vascular:  Normal pulses all extremities  Neurologic Exam Mental Status: Awake and fully alert. Oriented to place and time. Recent and remote memory intact. Attention span, concentration and fund of knowledge appropriate. Mood and affect  appropriate.  Cranial Nerves: Fundoscopic exam reveals sharp disc margins. Pupils equal, briskly reactive to light. Extraocular movements full without nystagmus. Visual fields full to confrontation. Hearing intact. Facial sensation intact. Face, tongue, palate moves normally and symmetrically.  Motor: Normal bulk and tone. Normal strength in all tested extremity muscles. Sensory.: intact to touch , pinprick , position and vibratory sensation.  Coordination: Rapid alternating movements normal in all extremities. Finger-to-nose and heel-to-shin performed accurately bilaterally. Gait and Station: Arises from chair  without difficulty. Stance is normal. Gait demonstrates normal stride length and balance . Able to heel, toe and tandem walk without difficulty.  Reflexes: 1+ and symmetric. Toes downgoing.    NIHSS  0 Modified Rankin  0   Diagnostic Data (Labs, Imaging, Testing)  CT HEAD WO CONTRAST 12/22/2017 IMPRESSION: 1. No acute finding.  ASPECTS is 10. 2. 18 mm mass in the expanded sella, probable pituitary adenoma. 3. Generalized atrophy.  CT ANGIO HEAD W OR WO CONTRAST CT ANGIO NECK W OR WO CONTRAST 12/22/2017 IMPRESSION: No significant vascular finding. Ordinary atherosclerosis. No flow limiting stenosis or significant irregularity. No large or medium vessel occlusion. Redemonstration of 2 cm pituitary macro adenoma.  MR BRAIN WO CONTRAST 12/22/2017 IMPRESSION: 1. No acute intracranial abnormality. 2. 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 3. Mild chronic ischemic microangiopathy. Small, old left cerebellar Infarct.  ECHOCARDIOGRAM 12/23/2017 Study Conclusions - Left ventricle: The cavity size was normal. There was moderate   concentric hypertrophy. Systolic function was normal. The   estimated ejection fraction was in the range of 60% to 65%. Wall   motion was normal; there were no regional wall motion   abnormalities. Doppler parameters are consistent  with abnormal   left ventricular relaxation (grade 1 diastolic dysfunction).   There was no evidence of elevated ventricular filling pressure by   Doppler parameters. - Aortic valve: Trileaflet; mildly thickened, mildly calcified   leaflets. There was mild stenosis. There was no regurgitation. - Mitral valve: Calcified annulus. Mildly thickened leaflets .   There was trivial regurgitation. - Left atrium: The atrium was mildly dilated. - Right ventricle: The cavity size was normal. Wall thickness was   normal. Systolic function was normal. - Right atrium: The atrium was normal in size. - Pulmonic valve: There was trivial regurgitation. - Pulmonary arteries: Systolic pressure was mildly increased. PA   peak pressure: 36 mm Hg (S). - Inferior vena cava: The vessel was normal in size. - Pericardium, extracardiac: There was no pericardial effusion.    ASSESSMENT: Billy [name] is a 84 y.o. year old male here with TIA on 12/22/17. Vascular risk factors include HTN and HLD.     PLAN: -Continue aspirin 81 mg daily  and Lipitor for secondary stroke prevention -Advised daughter to ensure that facility has stopped Plavix and continuing only on aspirin as it 3-week of DAPT therapy has been completed -F/u with PCP regarding your HLD and HTN management -continue to monitor BP at home -Maintain strict control of hypertension with blood pressure goal below 130/90, diabetes with hemoglobin A1c goal below 6.5% and cholesterol with LDL cholesterol (bad cholesterol) goal below 70 mg/dL. I also advised the patient to eat a healthy diet with plenty of whole grains, cereals, fruits and vegetables, exercise regularly and maintain ideal body weight.  Follow up in 4 months or call earlier if needed   Greater than 50% of time during this 25 minute visit was spent on counseling,explanation of diagnosis of TIA, reviewing risk factor management of HLD and HTN, planning of further management, discussion with  patient and family and coordination of care    Venancio Poisson, [facility]  [facility] [address] [place], [place] [address]  Phone [phone] Fax [phone]

## 2018-01-21 NOTE — Progress Notes (Signed)
I agree with the above plan 

## 2018-02-26 ENCOUNTER — Ambulatory Visit: Admit: 2018-02-26 | Payer: Self-pay | Admitting: Urology

## 2018-02-26 SURGERY — CYSTOSCOPY, TUR BLADDER TUMOR
Anesthesia: Choice | Site: Pelvis

## 2018-05-25 ENCOUNTER — Ambulatory Visit (INDEPENDENT_AMBULATORY_CARE_PROVIDER_SITE_OTHER): Payer: Medicare Other | Admitting: Adult Health

## 2018-05-25 ENCOUNTER — Encounter: Payer: Self-pay | Admitting: Adult Health

## 2018-05-25 VITALS — BP 123/73 | HR 79 | Wt 195.0 lb

## 2018-05-25 DIAGNOSIS — I10 Essential (primary) hypertension: Secondary | ICD-10-CM | POA: Diagnosis not present

## 2018-05-25 DIAGNOSIS — G459 Transient cerebral ischemic attack, unspecified: Secondary | ICD-10-CM | POA: Diagnosis not present

## 2018-05-25 DIAGNOSIS — E785 Hyperlipidemia, unspecified: Secondary | ICD-10-CM | POA: Diagnosis not present

## 2018-05-25 NOTE — Patient Instructions (Addendum)
Continue aspirin 81 mg daily  and lipitor 10mg   for secondary stroke prevention  Continue to follow up with PCP regarding cholesterol and blood pressure management   Encourage increase in activity during the day such as joining walk group/program - advise that patient is within close parameters of staff or longer walks with supervision  Continue Aricept 10mg  daily for memory   Continue to monitor blood pressure at home  Maintain strict control of hypertension with blood pressure goal below 130/90, diabetes with hemoglobin A1c goal below 6.5% and cholesterol with LDL cholesterol (bad cholesterol) goal below 70 mg/dL. I also advised the patient to eat a healthy diet with plenty of whole grains, cereals, fruits and vegetables, exercise regularly and maintain ideal body weight.  Followup in the future with me in 6 months or call earlier if needed       Thank you for coming to see [place] at [facility]. I hope we have been able to provide you high quality care today.  You may receive a patient satisfaction survey over the next few weeks. We would appreciate your feedback and comments so that we may continue to improve ourselves and the health of our patients.

## 2018-05-25 NOTE — Progress Notes (Signed)
I agree with the above plan 

## 2018-05-25 NOTE — Progress Notes (Signed)
[facility] [address] [place]. [place] [address] [phone]       OFFICE FOLLOW UP NOTE  [name] Date of Birth:  1927/05/05 Medical Record Number:  [id number]   Reason for visit: TIA follow up  CHIEF COMPLAINT:  Chief Complaint  Patient presents with  . Follow-up    TIA follow up room in backhallway pt with son Raquan Iannone, Pt is at [facility] asst living     HPI: Billy [name] is being seen today in the office for TIA on 12/22/2017. History obtained from Patient and chart review. Reviewed all radiology images and labs personally.  [name] is a 84 y.o. male with history of hypertension, mild dementia, overactive bladder   who presented with transient aphasia.   CT head reviewed and showed no acute abnormality.  MRA head reviewed and showed no acute stroke.  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  2D echo showed an EF of 60 to 65%.  A1c satisfactory at 5.6.  LDL 58 and as patient was on Lipitor 10 mg PTA it was recommended to continue current dose.  Patient was not on antithrombotic prior to admission but recommended DAPT for 3 weeks and then aspirin alone.  01/21/2018 visit: Patient is being seen today for hospital follow-up and is accompanied by his daughter in law.  Overall he is doing well with all symptoms resolved.  He is currently living at [facility] assisted living facility.  It was recommended at discharge to continue aspirin and Plavix for 3 weeks and then stop Plavix and continue aspirin alone.  As patient is an assisted living facility and papers were not sent, it is unclear whether patient is taking both aspirin and Plavix at this time.  It was placed on discharge paperwork to stop Plavix after 3 weeks but daughter will ensure this happened.  Patient denies bleeding or bruising.  Continues to take Lipitor without side effects of myalgias.  Blood pressure today 115/61.   PCP prescribes his Aricept 10 mg daily and denies decline in memory or concerns.  Patient has recently started physical therapy due to left hip weakness 3 times per week and does use a cane for balance.  Denies new or worsening stroke/TIA symptoms.  Interval history 05/25/2018: Patient is being seen today for follow-up visit and is accompanied by his son.  He continues to reside at [facility].  He continues to take aspirin 81 mg without side effects of bleeding or bruising.  Continues to take Lipitor without side effects myalgias.  Blood pressure today satisfactory 123/73.  Continues Aricept 10 mg daily for memory management without any side effects.  Son declines any recent worsening of memory.  He does continue to use his cane due to balance concerns but denies any recent falls.  He does admit to being sedentary and does plan on increasing his activity level with ambulating around the facility.  No further concerns at this time.  Denies new or worsening stroke/TIA symptoms.     ROS:   14 system review of systems performed and negative with exception of no complaints  PMH:  Past Medical History:  Diagnosis Date  . Hearing loss    hearing aids to both ears  . Hypercholesterolemia   . Hypertension   . Overactive bladder   . 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Billy [name])   . Stroke [facility])     PSH: History reviewed. No pertinent surgical  history.  Social History:  Social History   Socioeconomic History  . Marital status: Widowed    Spouse name: Not on file  . Number of children: Not on file  . Years of education: Not on file  . Highest education level: Not on file  Occupational History  . Not on file  Social Needs  . Financial resource strain: Not on file  . Food insecurity:    Worry: Not on file    Inability: Not on file  . Transportation needs:    Medical: Not on file    Non-medical: Not on file  Tobacco Use  . Smoking status: Former Research scientist (life sciences)  . Smokeless tobacco: Never Used  Substance and  Sexual Activity  . Alcohol use: Not Currently  . Drug use: Not Currently  . Sexual activity: Not on file  Lifestyle  . Physical activity:    Days per week: Not on file    Minutes per session: Not on file  . Stress: Not on file  Relationships  . Social connections:    Talks on phone: Not on file    Gets together: Not on file    Attends religious service: Not on file    Active member of club or organization: Not on file    Attends meetings of clubs or organizations: Not on file    Relationship status: Not on file  . Intimate partner violence:    Fear of current or ex partner: Not on file    Emotionally abused: Not on file    Physically abused: Not on file    Forced sexual activity: Not on file  Other Topics Concern  . Not on file  Social History Narrative  . Not on file    Family History:  Family History  Problem Relation Age of Onset  . Hypertension Mother   . Hypertension Father     Medications:   Current Outpatient Medications on File Prior to Visit  Medication Sig Dispense Refill  . acetaminophen (TYLENOL) 650 MG CR tablet Take 650 mg by mouth daily.    Marland Kitchen aspirin EC 81 MG EC tablet Take 1 tablet (81 mg total) by mouth daily.    Marland Kitchen atorvastatin (LIPITOR) 10 MG tablet Take 10 mg by mouth at bedtime.    . donepezil (ARICEPT) 10 MG tablet Take 10 mg by mouth at bedtime.    . metoprolol tartrate (LOPRESSOR) 25 MG tablet Take 12.5 mg by mouth daily.    . mirabegron ER (MYRBETRIQ) 50 MG TB24 tablet Take 50 mg by mouth daily.    . Multiple Vitamins-Minerals (CENTRUM SILVER 50+MEN PO) Take 1 tablet by mouth daily.    . Multiple Vitamins-Minerals (PRESERVISION AREDS 2+MULTI VIT PO) Take 1 capsule by mouth 2 (two) times daily.    . traZODone (DESYREL) 50 MG tablet Take 50 mg by mouth at bedtime.     No current facility-administered medications on file prior to visit.     Allergies:  No Known Allergies   Physical Exam  Vitals:   05/25/18 1440  BP: 123/73  Pulse: 79    Weight: 195 lb (88.5 kg)   Body mass index is 27.98 kg/m. No exam data present  General: well developed, pleasant elderly Caucasian male, well nourished, seated, in no evident distress Head: head normocephalic and atraumatic.   Neck: supple with no carotid or supraclavicular bruits Cardiovascular: regular rate and rhythm, no murmurs Musculoskeletal: no deformity Skin:  no rash/petichiae Vascular:  Normal pulses all extremities  Neurologic  Exam Mental Status: Awake and fully alert. Oriented to place and time. Recent and remote memory intact. Attention span, concentration and fund of knowledge appropriate. Mood and affect appropriate.  Cranial Nerves: Fundoscopic exam deferred. Pupils equal, briskly reactive to light. Extraocular movements full without nystagmus. Visual fields full to confrontation. Hearing intact. Facial sensation intact. Face, tongue, palate moves normally and symmetrically.  Motor: Normal bulk and tone. Normal strength in all tested extremity muscles. Sensory.: intact to touch , pinprick , position and vibratory sensation.  Coordination: Rapid alternating movements normal in all extremities. Finger-to-nose and heel-to-shin performed accurately bilaterally. Gait and Station: Arises from chair without difficulty. Stance is normal. Gait demonstrates normal stride length and balance with assistance of cane.  Reflexes: 1+ and symmetric. Toes downgoing.       Diagnostic Data (Labs, Imaging, Testing)  CT HEAD WO CONTRAST 12/22/2017 IMPRESSION: 1. No acute finding.  ASPECTS is 10. 2. 18 mm mass in the expanded sella, probable pituitary adenoma. 3. Generalized atrophy.  CT ANGIO HEAD W OR WO CONTRAST CT ANGIO NECK W OR WO CONTRAST 12/22/2017 IMPRESSION: No significant vascular finding. Ordinary atherosclerosis. No flow limiting stenosis or significant irregularity. No large or medium vessel occlusion. Redemonstration of 2 cm pituitary macro adenoma.  MR BRAIN WO  CONTRAST 12/22/2017 IMPRESSION: 1. No acute intracranial abnormality. 2. 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 3. Mild chronic ischemic microangiopathy. Small, old left cerebellar Infarct.  ECHOCARDIOGRAM 12/23/2017 Study Conclusions - Left ventricle: The cavity size was normal. There was moderate   concentric hypertrophy. Systolic function was normal. The   estimated ejection fraction was in the range of 60% to 65%. Wall   motion was normal; there were no regional wall motion   abnormalities. Doppler parameters are consistent with abnormal   left ventricular relaxation (grade 1 diastolic dysfunction).   There was no evidence of elevated ventricular filling pressure by   Doppler parameters. - Aortic valve: Trileaflet; mildly thickened, mildly calcified   leaflets. There was mild stenosis. There was no regurgitation. - Mitral valve: Calcified annulus. Mildly thickened leaflets .   There was trivial regurgitation. - Left atrium: The atrium was mildly dilated. - Right ventricle: The cavity size was normal. Wall thickness was   normal. Systolic function was normal. - Right atrium: The atrium was normal in size. - Pulmonic valve: There was trivial regurgitation. - Pulmonary arteries: Systolic pressure was mildly increased. PA   peak pressure: 36 mm Hg (S). - Inferior vena cava: The vessel was normal in size. - Pericardium, extracardiac: There was no pericardial effusion.    ASSESSMENT: Billy [name] is a 84 y.o. year old male here with TIA on 12/22/17. Vascular risk factors include HTN and HLD.  Patient is being seen today for follow-up visit and has been stable from stroke standpoint without residual deficits or recurring of symptoms.    PLAN: -Continue aspirin 81 mg daily  and Lipitor for secondary stroke prevention -F/u with PCP regarding your HLD and HTN management -Continue Aricept 10 mg daily for memory management -Highly encouraged to increase activity  level during the day such as joining their walking program -did advise [facility] staff that if he is ambulatory during the day for increased exercise, that he should remain in close parameters of staff or be supervised for longer walks. -continue to monitor BP at home -Maintain strict control of hypertension with blood pressure goal below 130/90, diabetes with hemoglobin A1c goal below 6.5% and cholesterol with LDL cholesterol (bad cholesterol) goal  below 70 mg/dL. I also advised the patient to eat a healthy diet with plenty of whole grains, cereals, fruits and vegetables, exercise regularly and maintain ideal body weight.  Follow up in 6 months or call earlier if needed   Greater than 50% of time during this 25 minute visit was spent on counseling,explanation of diagnosis of TIA, reviewing risk factor management of HLD and HTN, planning of further management, discussion with patient and family and coordination of care    Venancio Poisson, [facility]  [facility] [address] [place], [place] [address]  Phone [phone] Fax [phone]

## 2018-06-16 ENCOUNTER — Other Ambulatory Visit: Payer: Self-pay | Admitting: Urology

## 2018-07-07 NOTE — Progress Notes (Signed)
05/25/2018- office note in Epic from Venancio Poisson, Neurology  12/23/2017- noted in [place]- EKG and ECHO

## 2018-07-07 NOTE — Patient Instructions (Signed)
Billy [name]  07/07/2018   Your procedure is scheduled on: Thursday 07/23/2018  Report to [facility] Main  Entrance              Report to admitting at   0700 AM    Call this number if you have problems the morning of surgery [phone]    Remember: Do not eat food or drink liquids :After Midnight.               BRUSH YOUR TEETH MORNING OF SURGERY AND RINSE YOUR MOUTH OUT, NO CHEWING GUM CANDY OR MINTS.     Take these medicines the morning of surgery with A SIP OF WATER: Metoprolol Tartrate (Lopressor)                                You may not have any metal on your body including hair pins and              piercings  Do not wear jewelry, make-up, lotions, powders or perfumes, deodorant                         Men may shave face and neck.   Do not bring valuables to the hospital. [place].  Contacts, dentures or bridgework may not be worn into surgery.  Leave suitcase in the car. After surgery it may be brought to your room.     Patients discharged the day of surgery will not be allowed to drive home.  Name and phone number of your driver:                Please read over the following fact sheets you were given: _____________________________________________________________________             [facility] - Preparing for Surgery Before surgery, you can play an important role.  Because skin is not sterile, your skin needs to be as free of germs as possible.  You can reduce the number of germs on your skin by washing with CHG (chlorahexidine gluconate) soap before surgery.  CHG is an antiseptic cleaner which kills germs and bonds with the skin to continue killing germs even after washing. Please DO NOT use if you have an allergy to CHG or antibacterial soaps.  If your skin becomes reddened/irritated stop using the CHG and inform your nurse when you arrive at Short Stay. Do not shave (including legs  and underarms) for at least 48 hours prior to the first CHG shower.  You may shave your face/neck. Please follow these instructions carefully:  1.  Shower with CHG Soap the night before surgery and the  morning of Surgery.  2.  If you choose to wash your hair, wash your hair first as usual with your  normal  shampoo.  3.  After you shampoo, rinse your hair and body thoroughly to remove the  shampoo.                           4.  Use CHG as you would any other liquid soap.  You can apply chg directly  to the skin and wash  Gently with a scrungie or clean washcloth.  5.  Apply the CHG Soap to your body ONLY FROM THE NECK DOWN.   Do not use on face/ open                           Wound or open sores. Avoid contact with eyes, ears mouth and genitals (private parts).                       Wash face,  Genitals (private parts) with your normal soap.             6.  Wash thoroughly, paying special attention to the area where your surgery  will be performed.  7.  Thoroughly rinse your body with warm water from the neck down.  8.  DO NOT shower/wash with your normal soap after using and rinsing off  the CHG Soap.                9.  Pat yourself dry with a clean towel.            10.  Wear clean pajamas.            11.  Place clean sheets on your bed the night of your first shower and do not  sleep with pets. Day of Surgery : Do not apply any lotions/deodorants the morning of surgery.  Please wear clean clothes to the hospital/surgery center.  FAILURE TO FOLLOW THESE INSTRUCTIONS MAY RESULT IN THE CANCELLATION OF YOUR SURGERY PATIENT SIGNATURE_________________________________  NURSE SIGNATURE__________________________________  ________________________________________________________________________

## 2018-07-21 ENCOUNTER — Encounter (HOSPITAL_COMMUNITY): Payer: Self-pay

## 2018-07-21 ENCOUNTER — Other Ambulatory Visit: Payer: Self-pay

## 2018-07-21 ENCOUNTER — Encounter (HOSPITAL_COMMUNITY)
Admission: RE | Admit: 2018-07-21 | Discharge: 2018-07-21 | Disposition: A | Discharge status: Home / Self Care | Payer: Medicare Other | Source: Ambulatory Visit | Attending: Urology | Admitting: Urology

## 2018-07-21 DIAGNOSIS — Z79899 Other long term (current) drug therapy: Secondary | ICD-10-CM | POA: Diagnosis not present

## 2018-07-21 DIAGNOSIS — Z87891 Personal history of nicotine dependence: Secondary | ICD-10-CM | POA: Diagnosis not present

## 2018-07-21 DIAGNOSIS — Z01818 Encounter for other preprocedural examination: Secondary | ICD-10-CM | POA: Insufficient documentation

## 2018-07-21 DIAGNOSIS — Z8052 Family history of malignant neoplasm of bladder: Secondary | ICD-10-CM | POA: Diagnosis not present

## 2018-07-21 DIAGNOSIS — R32 Unspecified urinary incontinence: Secondary | ICD-10-CM | POA: Diagnosis not present

## 2018-07-21 DIAGNOSIS — C678 Malignant neoplasm of overlapping sites of bladder: Secondary | ICD-10-CM | POA: Diagnosis not present

## 2018-07-21 DIAGNOSIS — Z7982 Long term (current) use of aspirin: Secondary | ICD-10-CM | POA: Diagnosis not present

## 2018-07-21 DIAGNOSIS — F0390 Unspecified dementia without behavioral disturbance: Secondary | ICD-10-CM | POA: Diagnosis not present

## 2018-07-21 DIAGNOSIS — R3915 Urgency of urination: Secondary | ICD-10-CM | POA: Diagnosis not present

## 2018-07-21 DIAGNOSIS — C679 Malignant neoplasm of bladder, unspecified: Secondary | ICD-10-CM | POA: Insufficient documentation

## 2018-07-21 DIAGNOSIS — C61 Malignant neoplasm of prostate: Secondary | ICD-10-CM | POA: Diagnosis not present

## 2018-07-21 DIAGNOSIS — N3281 Overactive bladder: Secondary | ICD-10-CM | POA: Diagnosis not present

## 2018-07-21 LAB — BASIC METABOLIC PANEL
Anion gap: 8 (ref 5–15)
BUN: 23 mg/dL (ref 8–23)
CHLORIDE: 102 mmol/L (ref 98–111)
CO2: 26 mmol/L (ref 22–32)
CREATININE: 0.71 mg/dL (ref 0.61–1.24)
Calcium: 9.3 mg/dL (ref 8.9–10.3)
GFR calc Af Amer: >60 mL/min (ref >60)
GFR calc non Af Amer: >60 mL/min (ref >60)
Glucose, Bld: 97 mg/dL (ref 70–99)
Potassium: 4.2 mmol/L (ref 3.5–5.1)
Sodium: 136 mmol/L (ref 135–145)

## 2018-07-21 LAB — CBC
HEMATOCRIT: 37.5 % — AB (ref 39.0–52.0)
Hemoglobin: 12.4 g/dL — ABNORMAL LOW (ref 13.0–17.0)
MCH: 31.9 pg (ref 26.0–34.0)
MCHC: 33.1 g/dL (ref 30.0–36.0)
MCV: 96.4 fL (ref 80.0–100.0)
Platelets: 232 10*3/uL (ref 150–400)
RBC: 3.89 MIL/uL — ABNORMAL LOW (ref 4.22–5.81)
RDW: 15.2 % (ref 11.5–15.5)
WBC: 8 10*3/uL (ref 4.0–10.5)
nRBC: 0 % (ref 0.0–0.2)

## 2018-07-23 ENCOUNTER — Ambulatory Visit (HOSPITAL_COMMUNITY): Payer: Medicare Other

## 2018-07-23 ENCOUNTER — Ambulatory Visit (HOSPITAL_COMMUNITY)
Admission: RE | Admit: 2018-07-23 | Discharge: 2018-07-23 | Disposition: A | Discharge status: Home / Self Care | Payer: Medicare Other | Source: Ambulatory Visit | Attending: Urology | Admitting: Urology

## 2018-07-23 ENCOUNTER — Encounter (HOSPITAL_COMMUNITY)
Admission: RE | Disposition: A | Discharge status: Home / Self Care | Payer: Self-pay | Source: Ambulatory Visit | Attending: Urology

## 2018-07-23 ENCOUNTER — Encounter (HOSPITAL_COMMUNITY): Payer: Self-pay | Admitting: *Deleted

## 2018-07-23 ENCOUNTER — Ambulatory Visit (HOSPITAL_COMMUNITY): Payer: Medicare Other | Admitting: Registered Nurse

## 2018-07-23 DIAGNOSIS — C61 Malignant neoplasm of prostate: Secondary | ICD-10-CM | POA: Insufficient documentation

## 2018-07-23 DIAGNOSIS — R3915 Urgency of urination: Secondary | ICD-10-CM | POA: Insufficient documentation

## 2018-07-23 DIAGNOSIS — C678 Malignant neoplasm of overlapping sites of bladder: Secondary | ICD-10-CM | POA: Diagnosis not present

## 2018-07-23 DIAGNOSIS — R32 Unspecified urinary incontinence: Secondary | ICD-10-CM | POA: Diagnosis not present

## 2018-07-23 DIAGNOSIS — Z7982 Long term (current) use of aspirin: Secondary | ICD-10-CM | POA: Insufficient documentation

## 2018-07-23 DIAGNOSIS — Z87891 Personal history of nicotine dependence: Secondary | ICD-10-CM | POA: Insufficient documentation

## 2018-07-23 DIAGNOSIS — F0390 Unspecified dementia without behavioral disturbance: Secondary | ICD-10-CM | POA: Insufficient documentation

## 2018-07-23 DIAGNOSIS — Z8052 Family history of malignant neoplasm of bladder: Secondary | ICD-10-CM | POA: Insufficient documentation

## 2018-07-23 DIAGNOSIS — Z79899 Other long term (current) drug therapy: Secondary | ICD-10-CM | POA: Insufficient documentation

## 2018-07-23 DIAGNOSIS — N3281 Overactive bladder: Secondary | ICD-10-CM | POA: Insufficient documentation

## 2018-07-23 HISTORY — PX: CYSTOSCOPY W/ RETROGRADES: SHX1426

## 2018-07-23 HISTORY — PX: TRANSURETHRAL RESECTION OF BLADDER TUMOR WITH MITOMYCIN-C: SHX6459

## 2018-07-23 SURGERY — TRANSURETHRAL RESECTION OF BLADDER TUMOR WITH MITOMYCIN-C
Anesthesia: General | Site: Ureter

## 2018-07-23 MED ORDER — ONDANSETRON HCL 4 MG/2ML IJ SOLN: 4.0000 mg | Freq: Once | INTRAMUSCULAR | Status: DC | PRN

## 2018-07-23 MED ORDER — HYDROMORPHONE HCL 1 MG/ML IJ SOLN
0.2500 mg | INTRAMUSCULAR | Status: DC | PRN
  Administered 2018-07-23 (×2): 0.5 mg via INTRAVENOUS

## 2018-07-23 MED ORDER — PHENAZOPYRIDINE HCL 200 MG PO TABS: 200.0000 mg | ORAL_TABLET | Freq: Three times a day (TID) | ORAL | 0 refills | Status: DC | PRN

## 2018-07-23 MED ORDER — HYDROMORPHONE HCL 1 MG/ML IJ SOLN
INTRAMUSCULAR | Status: AC
  Filled 2018-07-23: qty 1

## 2018-07-23 MED ORDER — FENTANYL CITRATE (PF) 100 MCG/2ML IJ SOLN
INTRAMUSCULAR | Status: DC | PRN
  Administered 2018-07-23: 50 ug via INTRAVENOUS
  Administered 2018-07-23 (×2): 25 ug via INTRAVENOUS

## 2018-07-23 MED ORDER — FENTANYL CITRATE (PF) 100 MCG/2ML IJ SOLN
INTRAMUSCULAR | Status: AC
  Filled 2018-07-23: qty 2

## 2018-07-23 MED ORDER — IOHEXOL 300 MG/ML  SOLN
INTRAMUSCULAR | Status: DC | PRN
  Administered 2018-07-23: 18 mL

## 2018-07-23 MED ORDER — PROPOFOL 10 MG/ML IV BOLUS
INTRAVENOUS | Status: AC
  Filled 2018-07-23: qty 20

## 2018-07-23 MED ORDER — SODIUM CHLORIDE 0.9 % IR SOLN
Status: DC | PRN
  Administered 2018-07-23 (×2): 6000 mL
  Administered 2018-07-23: 3000 mL

## 2018-07-23 MED ORDER — DEXAMETHASONE SODIUM PHOSPHATE 10 MG/ML IJ SOLN
INTRAMUSCULAR | Status: AC
  Filled 2018-07-23: qty 1

## 2018-07-23 MED ORDER — MEPERIDINE HCL 50 MG/ML IJ SOLN: 6.2500 mg | INTRAMUSCULAR | Status: DC | PRN

## 2018-07-23 MED ORDER — GEMCITABINE CHEMO FOR BLADDER INSTILLATION 2000 MG
2000.0000 mg | Freq: Once | INTRAVENOUS | Status: AC
  Administered 2018-07-23: 2000 mg via INTRAVESICAL
  Filled 2018-07-23: qty 2000

## 2018-07-23 MED ORDER — PHENYLEPHRINE 40 MCG/ML (10ML) SYRINGE FOR IV PUSH (FOR BLOOD PRESSURE SUPPORT)
PREFILLED_SYRINGE | INTRAVENOUS | Status: DC | PRN
  Administered 2018-07-23 (×3): 80 ug via INTRAVENOUS

## 2018-07-23 MED ORDER — LACTATED RINGERS IV SOLN
INTRAVENOUS | Status: DC
  Administered 2018-07-23: 08:00:00 via INTRAVENOUS

## 2018-07-23 MED ORDER — BELLADONNA ALKALOIDS-OPIUM 16.2-30 MG RE SUPP
RECTAL | Status: AC
  Filled 2018-07-23: qty 1

## 2018-07-23 MED ORDER — LIDOCAINE 2% (20 MG/ML) 5 ML SYRINGE
INTRAMUSCULAR | Status: AC
  Filled 2018-07-23: qty 5

## 2018-07-23 MED ORDER — ONDANSETRON HCL 4 MG/2ML IJ SOLN
INTRAMUSCULAR | Status: AC
  Filled 2018-07-23: qty 2

## 2018-07-23 MED ORDER — PROPOFOL 10 MG/ML IV BOLUS
INTRAVENOUS | Status: DC | PRN
  Administered 2018-07-23: 30 mg via INTRAVENOUS
  Administered 2018-07-23: 20 mg via INTRAVENOUS
  Administered 2018-07-23: 150 mg via INTRAVENOUS

## 2018-07-23 MED ORDER — ONDANSETRON HCL 4 MG/2ML IJ SOLN
INTRAMUSCULAR | Status: DC | PRN
  Administered 2018-07-23: 4 mg via INTRAVENOUS

## 2018-07-23 MED ORDER — BELLADONNA ALKALOIDS-OPIUM 16.2-60 MG RE SUPP
RECTAL | Status: DC | PRN
  Administered 2018-07-23: 1 via RECTAL

## 2018-07-23 MED ORDER — 0.9 % SODIUM CHLORIDE (POUR BTL) OPTIME
TOPICAL | Status: DC | PRN
  Administered 2018-07-23: 1000 mL

## 2018-07-23 MED ORDER — CEFAZOLIN SODIUM-DEXTROSE 2-4 GM/100ML-% IV SOLN
2.0000 g | INTRAVENOUS | Status: AC
  Administered 2018-07-23: 2 g via INTRAVENOUS
  Filled 2018-07-23: qty 100

## 2018-07-23 MED ORDER — LIDOCAINE 2% (20 MG/ML) 5 ML SYRINGE
INTRAMUSCULAR | Status: DC | PRN
  Administered 2018-07-23: 100 mg via INTRAVENOUS

## 2018-07-23 MED ORDER — PHENYLEPHRINE 40 MCG/ML (10ML) SYRINGE FOR IV PUSH (FOR BLOOD PRESSURE SUPPORT)
PREFILLED_SYRINGE | INTRAVENOUS | Status: AC
  Filled 2018-07-23: qty 10

## 2018-07-23 SURGICAL SUPPLY — 21 items
BAG URINE DRAINAGE (UROLOGICAL SUPPLIES) ×4 IMPLANT
BAG URO CATCHER STRL LF (MISCELLANEOUS) ×4 IMPLANT
BASKET ZERO TIP NITINOL 2.4FR (BASKET) IMPLANT
CATH FOLEY 2WAY SLVR  5CC 18FR (CATHETERS) ×2
CATH FOLEY 2WAY SLVR 5CC 18FR (CATHETERS) ×2 IMPLANT
CATH URET 5FR 28IN OPEN ENDED (CATHETERS) ×4 IMPLANT
CLOTH BEACON ORANGE TIMEOUT ST (SAFETY) ×4 IMPLANT
COVER WAND RF STERILE (DRAPES) IMPLANT
EXTRACTOR STONE 1.7FRX115CM (UROLOGICAL SUPPLIES) IMPLANT
GLOVE BIOGEL M STRL SZ7.5 (GLOVE) ×4 IMPLANT
GOWN STRL REUS W/TWL XL LVL3 (GOWN DISPOSABLE) ×4 IMPLANT
GUIDEWIRE ANG ZIPWIRE 038X150 (WIRE) IMPLANT
GUIDEWIRE STR DUAL SENSOR (WIRE) ×4 IMPLANT
MANIFOLD NEPTUNE II (INSTRUMENTS) ×4 IMPLANT
PACK CYSTO (CUSTOM PROCEDURE TRAY) ×4 IMPLANT
SHEATH URETERAL 12FRX28CM (UROLOGICAL SUPPLIES) IMPLANT
SHEATH URETERAL 12FRX35CM (MISCELLANEOUS) IMPLANT
TUBING CONNECTING 10 (TUBING) ×3 IMPLANT
TUBING CONNECTING 10' (TUBING) ×1
TUBING UROLOGY SET (TUBING) ×4 IMPLANT
WIRE COONS/BENSON .038X145CM (WIRE) IMPLANT

## 2018-07-23 NOTE — Transfer of Care (Signed)
Immediate Anesthesia Transfer of Care Note  Patient: Billy [name]  Procedure(s) Performed: TRANSURETHRAL RESECTION OF BLADDER TUMOR WITH GEMCITABINE (N/A Bladder) CYSTOSCOPY WITH RETROGRADE PYELOGRAM (Bilateral Ureter)  Patient Location: PACU  Anesthesia Type:General  Level of Consciousness: sedated  Airway & Oxygen Therapy: Patient Spontanous Breathing and Patient connected to face mask oxygen  Post-op Assessment: Report given to RN and Post -op Vital signs reviewed and stable  Post vital signs: Reviewed and stable  Last Vitals:  Vitals Value Taken Time  BP    Temp    Pulse 65 07/23/2018 10:02 AM  Resp    SpO2 100 % 07/23/2018 10:02 AM  Vitals shown include unvalidated device data.  Last Pain:  Vitals:   07/23/18 0733  TempSrc:   PainSc: 0-No pain         Complications: No apparent anesthesia complications

## 2018-07-23 NOTE — Progress Notes (Signed)
Assumed care of patient from Estell Harpin, RN. Patient has voided x 2 with only scant amount of blood-tinged urine. In Recliner drinking fluids.

## 2018-07-23 NOTE — Anesthesia Preprocedure Evaluation (Signed)
Anesthesia Evaluation  Patient identified by MRN, date of birth, ID band Patient awake    Reviewed: Allergy & Precautions, NPO status , Patient's Chart, lab work & pertinent test results  Airway Mallampati: I  TM Distance: >3 FB Neck ROM: Full    Dental   Pulmonary former smoker,    Pulmonary exam normal        Cardiovascular hypertension, Pt. on medications Normal cardiovascular exam     Neuro/Psych Dementia    GI/Hepatic   Endo/Other    Renal/GU      Musculoskeletal   Abdominal   Peds  Hematology   Anesthesia Other Findings   Reproductive/Obstetrics                             Anesthesia Physical Anesthesia Plan  ASA: III  Anesthesia Plan: General   Post-op Pain Management:    Induction: Intravenous  PONV Risk Score and Plan: 2 and Ondansetron and Treatment may vary due to age or medical condition  Airway Management Planned: LMA  Additional Equipment:   Intra-op Plan:   Post-operative Plan: Extubation in OR  Informed Consent: I have reviewed the patients History and Physical, chart, labs and discussed the procedure including the risks, benefits and alternatives for the proposed anesthesia with the patient or authorized representative who has indicated his/her understanding and acceptance.     Plan Discussed with: CRNA and Surgeon  Anesthesia Plan Comments:         Anesthesia Quick Evaluation

## 2018-07-23 NOTE — Discharge Instructions (Signed)

## 2018-07-23 NOTE — Op Note (Addendum)
Preoperative diagnosis:  1. REcurrent bladder cancer   Postoperative diagnosis:  1. Recurrent bladder cancer   Procedure: 1. TURBT 2cm 2. Bilateral retrograde pyelogram with interpretation  Surgeon: Ardis Hughs, MD  Anesthesia: General  Complications: None  Intraoperative findings:  #1)  Multiple tumors, approximately 10, each the 5 to 10 mm in size, they were located in the posterior bladder wall, bladder neck, right lateral wall/dome and left lateral wall #2: Retrograde pyelograms were performed, the left retrograde demonstrated a normal caliber ureter with no filling defects or abnormalities.  The right retrograde pyelogram was similar with no filling defects, significant abnormalities, or hydroureteronephrosis.  Each of these was performed with 10 cc of Omnipaque contrast.  EBL: Minimal  Specimens: Bladder tumors  Indication: Billy [name] is a 84 y.o. patient with recurrent bladder cancer.  After reviewing the management options for treatment, he elected to proceed with the above surgical procedure(s). We have discussed the potential benefits and risks of the procedure, side effects of the proposed treatment, the likelihood of the patient achieving the goals of the procedure, and any potential problems that might occur during the procedure or recuperation. Informed consent has been obtained.  Description of procedure:  The patient was taken to the operating room and general anesthesia was induced.  The patient was placed in the dorsal lithotomy position, prepped and draped in the usual sterile fashion, and preoperative antibiotics were administered. A preoperative time-out was performed.   21 French 30 degrees cystoscope was gently passed through the patient's urethra and into the bladder under visual guidance.  A 360 degrees cystoscopic evaluation was performed with the above findings.  I then exchanged the 30 degree lens for the 70 degree lens and repeated the evaluation  with no significant additional findings.  He using a 5 [place] open-ended ureteral catheter I performed a retrograde pyelograms as described above.  He findings were also noted as above.  I then removed the cystoscope and first dilated the patient's urethral meatus up to 2 [place] and then advanced a 26 French resectoscope sheath with the visual obturator.  I exchange the obturator for a resectoscope element and performed a TURBT of the above tumors.  There was no significant fat noted after the resection.  I irrigated the tumors out and remove the scope.  A rectal exam under anesthesia noted a normal palpating prostate and no significant masses.  I then passed an 15 French Foley catheter into the patient's bladder and irrigated the patient's bladder noting no additional tumors and clear reflux.  In the PACU, Gemcitabine was instilled into the patient's bladder through an 70 French Foley catheter.  This was allowed to dwell for 60-90 minutes prior to emptying the Foley catheter and removing it.   Ardis Hughs, M.D.

## 2018-07-23 NOTE — Anesthesia Postprocedure Evaluation (Signed)
Anesthesia Post Note  Patient: [name]  Procedure(s) Performed: TRANSURETHRAL RESECTION OF BLADDER TUMOR WITH GEMCITABINE (N/A Bladder) CYSTOSCOPY WITH RETROGRADE PYELOGRAM (Bilateral Ureter)     Patient location during evaluation: PACU Anesthesia Type: General Level of consciousness: awake and alert Pain management: pain level controlled Vital Signs Assessment: post-procedure vital signs reviewed and stable Respiratory status: spontaneous breathing, nonlabored ventilation, respiratory function stable and patient connected to nasal cannula oxygen Cardiovascular status: blood pressure returned to baseline and stable Postop Assessment: no apparent nausea or vomiting Anesthetic complications: no    Last Vitals:  Vitals:   07/23/18 1145 07/23/18 1210  BP:  (!) 151/66  Pulse:  63  Resp:  12  Temp: 36.4 C 36.6 C  SpO2:  93%    Last Pain:  Vitals:   07/23/18 1210  TempSrc: Oral  PainSc: 0-No pain                 Montel Vanderhoof DAVID

## 2018-07-23 NOTE — H&P (Signed)
78M new to the practice who moved here from [place], [place] where he was followed for h/o prostate cancer, bladder cancer and OAB.   Prostate Cancer - Records are sparse, but state that the patient was diagnosed with Prostate cancer and treated with intermittent androgen deprivation.   Bladder Cancer - Last cystoscopy was done in Feb 2019. There was a 2cm lesion recorded at the dome of his bladder as well as 3 or 4 other lesions on the posterior wall that were approximately 1 cm. I reviewed a note from Feb 2018 that noted a poly on the anterior wall of the bladder that was unchanged from the year prior - I get the sense that the urologist was following this area, which sounds like an area of low grade recurrence.   OAB - was taking Vesicare 5mg  for his symptoms and it was controlling his symptoms well. However, the costs of the medication became an issue.   Interval: The patient presents today for the above. He has noted some slight worsening of his urinary urgency and leakage. Denies any hematuria. Denies any pain. He can recall the details of the histology of his bladder cancer. He did have 2 resections in 2016 in 2017. He did get some BCG for these. He doesn't know the details of his prostate cancer.     ALLERGIES: None   MEDICATIONS: Aspirin  Metoprolol Succinate  Myrbetriq  Atorvastatin Calcium  Centrum Silver  Donepezil Hcl  Preservision Areds  Trazodone Hcl     GU PSH: None   NON-GU PSH: None   GU PMH: None   NON-GU PMH: None   FAMILY HISTORY: Bladder Cancer - Mother Heart Disease - Father    Notes: 1 son, 2 daughters    SOCIAL HISTORY: Marital Status: Widowed Preferred Language: English; Ethnicity: Not Hispanic Or Latino; Race: White Current Smoking Status: Patient does not smoke anymore. Has not smoked since 04/12/2014.   Tobacco Use Assessment Completed: Used Tobacco in last 30 days? Light Drinker.  Drinks 1 caffeinated drink per day.    REVIEW OF SYSTEMS:    GU  Review Male:   Patient reports hard to postpone urination. Patient denies frequent urination, burning/ pain with urination, get up at night to urinate, leakage of urine, stream starts and stops, trouble starting your stream, have to strain to urinate , erection problems, and penile pain.  Gastrointestinal (Upper):   Patient denies nausea, vomiting, and indigestion/ heartburn.  Gastrointestinal (Lower):   Patient reports constipation. Patient denies diarrhea.  Constitutional:   Patient denies fever, night sweats, weight loss, and fatigue.  Skin:   Patient denies skin rash/ lesion and itching.  Eyes:   Patient denies blurred vision and double vision.  Ears/ Nose/ Throat:   Patient denies sore throat and sinus problems.  Hematologic/Lymphatic:   Patient denies easy bruising and swollen glands.  Cardiovascular:   Patient denies leg swelling and chest pains.  Respiratory:   Patient reports cough. Patient denies shortness of breath.  Endocrine:   Patient denies excessive thirst.  Musculoskeletal:   Patient denies back pain and joint pain.  Neurological:   Patient denies headaches and dizziness.  Psychologic:   Patient denies depression and anxiety.   VITAL SIGNS:      05/01/2018 03:26 PM  Weight 204 lb / 92.53 kg  Height 70 in / 177.8 cm  BP 136/67 mmHg  Pulse 67 /min  BMI 29.3 kg/m   GU PHYSICAL EXAMINATION:    Anus and Perineum: No hemorrhoids.  No anal stenosis. No rectal fissure, no anal fissure. No edema, no dimple, no perineal tenderness, no anal tenderness.  Scrotum: No lesions. No edema. No cysts. No warts.  Epididymides: Right: no spermatocele, no masses, no cysts, no tenderness, no induration, no enlargement. Left: no spermatocele, no masses, no cysts, no tenderness, no induration, no enlargement.  Testes: No tenderness, no swelling, no enlargement left testes. No tenderness, no swelling, no enlargement right testes. Normal location left testes. Normal location right testes. No mass,  no cyst, no varicocele, no hydrocele left testes. No mass, no cyst, no varicocele, no hydrocele right testes.  Urethral Meatus: Normal size. No lesion, no wart, no discharge, no polyp. Normal location.  Penis: Circumcised, no warts, no cracks. No dorsal Peyronie's plaques, no left corporal Peyronie's plaques, no right corporal Peyronie's plaques, no scarring, no warts. No balanitis, no meatal stenosis.  Seminal Vesicles: Nonpalpable.  Sphincter Tone: Normal sphincter. No rectal tenderness. No rectal mass.    Notes: The patient has a flat prostate that is nonpalpable. He has hemorrhoids making a rectal exam extremely painful for him.   MULTI-SYSTEM PHYSICAL EXAMINATION:    Constitutional: Well-nourished. No physical deformities. Normally developed. Good grooming.  Neck: Neck symmetrical, not swollen. Normal tracheal position.  Respiratory: No labored breathing, no use of accessory muscles.   Cardiovascular: Normal temperature, normal extremity pulses, no swelling, no varicosities.  Lymphatic: No enlargement of neck, axillae, groin.  Skin: No paleness, no jaundice, no cyanosis. No lesion, no ulcer, no rash.  Neurologic / Psychiatric: Oriented to time, oriented to place, oriented to person. No depression, no anxiety, no agitation.  Gastrointestinal: No mass, no tenderness, no rigidity, non obese abdomen.  Eyes: Normal conjunctivae. Normal eyelids.  Ears, Nose, Mouth, and Throat: Left ear no scars, no lesions, no masses. Right ear no scars, no lesions, no masses. Nose no scars, no lesions, no masses. Normal hearing. Normal lips.  Musculoskeletal: Normal gait and station of head and neck.     PAST DATA REVIEWED:  Source Of History:  Patient  Records Review:   Previous Doctor Records, Previous Patient Records, POC Tool   PROCEDURES:         Flexible Cystoscopy - 52000  Risks, benefits, and some of the potential complications of the procedure were discussed at length with the patient including  infection, bleeding, voiding discomfort, urinary retention, fever, chills, sepsis, and others. All questions were answered. Informed consent was obtained. Sterile technique and intraurethral analgesia were used.  Meatus:  Normal size. Normal location. Normal condition.  Urethra:  No strictures.  External Sphincter:  Normal.  Verumontanum:  Normal.  Prostate:  Non-obstructing. No hyperplasia.  Bladder Neck:  Non-obstructing.  Ureteral Orifices:  Normal location. Normal size. Normal shape. Effluxed clear urine.  Bladder:  The patient has numerous small papillary lesions on the posterior floor of his bladder. He also has numerous lesions along the bladder neck and anterior aspect of the bladder wall. In total, he likely has several centimeters with tumor.      The lower urinary tract was carefully examined. The procedure was well-tolerated and without complications. Antibiotic instructions were given. Instructions were given to call the office immediately for bloody urine, difficulty urinating, urinary retention, painful or frequent urination, fever, chills, nausea, vomiting or other illness. The patient stated that he understood these instructions and would comply with them.         Urinalysis Dipstick Dipstick Cont'd  Color: Yellow Bilirubin: Neg mg/dL  Appearance: Clear Ketones: Neg mg/dL  Specific Gravity: 1.020 Blood: Neg ery/uL  pH: <=5.0 Protein: Neg mg/dL  Glucose: Neg mg/dL Urobilinogen: 0.2 mg/dL    Nitrites: Neg    Leukocyte Esterase: Neg leu/uL    ASSESSMENT:      ICD-10 Details  1 GU:   Bladder Cancer overlapping sites - C67.8   2   Urinary Urgency - R39.15   3   Prostate Cancer - C61    PLAN:           Document Letter(s):  Created for Patient: Clinical Summary         Notes:   Bladder cancer: The patient has numerous lesions within his bladder. This is certainly more tumor burden than was described in February, lending to the evidence that his bladder cancer has  progressed. When a long discussion about her options and I recommended that they strongly consider a transurethral resection of his bladder tumor to prevent these from progressing and ultimately creating intractable bleeding and worsening incontinence. The surgery would likely take me about an hour. We would ultimately then performed post procedure gemcitabine to reduce his risks of recurrence. He is at slightly increased risk for worsening of his dementia following anesthesia. I explained to the patient's son that this is something that is not going to get better, only worse. I felt like if we did now would give him a chance to benefit from it for longer. We could then follow him and the flexible with his treatment options down the road.   Prostate cancer: The patient's prostate today was relatively nonpalpable. I didn't feel any extensive tumors or lesions. I did recommend a PSA, he can have this drawn by his primary care provider. This will just give [place] insight into the patient's disease, and whether or not it needs to be treated.   Overactive bladder: Continue Myrbetriq. I suspect that removing the bladder tumors would help some of his urgency.   The patient's family will contact me when they would like to schedule the procedure to remove his bladder polyps.

## 2018-07-23 NOTE — Anesthesia Procedure Notes (Signed)
Procedure Name: LMA Insertion Date/Time: 07/23/2018 8:51 AM Performed by: Talbot Grumbling, CRNA Pre-anesthesia Checklist: Patient identified, Emergency Drugs available, Suction available and Patient being monitored Patient Re-evaluated:Patient Re-evaluated prior to induction Oxygen Delivery Method: Circle system utilized Preoxygenation: Pre-oxygenation with 100% oxygen Induction Type: IV induction Ventilation: Mask ventilation without difficulty LMA: LMA inserted LMA Size: 4.0 Number of attempts: 1 Placement Confirmation: positive ETCO2 and breath sounds checked- equal and bilateral Tube secured with: Tape Dental Injury: Teeth and Oropharynx as per pre-operative assessment

## 2018-07-24 ENCOUNTER — Encounter (HOSPITAL_COMMUNITY): Payer: Self-pay | Admitting: Urology

## 2018-11-18 ENCOUNTER — Telehealth: Payer: Self-pay

## 2018-11-18 NOTE — Telephone Encounter (Signed)
I called pts daughter in law Vinnie Level about pts appt being r/s in 3 months because of COVID 19.She recommend I call her husband pts on about the appt. Pt is still at [facility].

## 2018-11-18 NOTE — Telephone Encounter (Signed)
Left vm on pts son cell number detail. I explain we are not seeing pts in office due to COVID 19. Also if they are in a nursing home, assistant living facility we are r/s pts in 2 to 3 months out. I stated appt will be cancel, and to to r/s in August or SEtp 2020.

## 2018-11-24 ENCOUNTER — Ambulatory Visit: Payer: Medicare Other | Admitting: Adult Health

## 2019-03-18 ENCOUNTER — Other Ambulatory Visit: Payer: Self-pay

## 2019-03-18 ENCOUNTER — Emergency Department (HOSPITAL_COMMUNITY): Payer: Medicare Other

## 2019-03-18 ENCOUNTER — Emergency Department (HOSPITAL_COMMUNITY)
Admission: EM | Admit: 2019-03-18 | Discharge: 2019-03-18 | Disposition: A | Discharge status: Home / Self Care | Payer: Medicare Other | Attending: Emergency Medicine | Admitting: Emergency Medicine

## 2019-03-18 ENCOUNTER — Encounter (HOSPITAL_COMMUNITY): Payer: Self-pay

## 2019-03-18 DIAGNOSIS — Z7982 Long term (current) use of aspirin: Secondary | ICD-10-CM | POA: Diagnosis not present

## 2019-03-18 DIAGNOSIS — M542 Cervicalgia: Secondary | ICD-10-CM | POA: Insufficient documentation

## 2019-03-18 DIAGNOSIS — W1830XA Fall on same level, unspecified, initial encounter: Secondary | ICD-10-CM | POA: Insufficient documentation

## 2019-03-18 DIAGNOSIS — R51 Headache: Secondary | ICD-10-CM | POA: Diagnosis not present

## 2019-03-18 DIAGNOSIS — T148XXA Other injury of unspecified body region, initial encounter: Secondary | ICD-10-CM

## 2019-03-18 DIAGNOSIS — S50811A Abrasion of right forearm, initial encounter: Secondary | ICD-10-CM | POA: Insufficient documentation

## 2019-03-18 DIAGNOSIS — F0390 Unspecified dementia without behavioral disturbance: Secondary | ICD-10-CM | POA: Diagnosis not present

## 2019-03-18 DIAGNOSIS — Z87891 Personal history of nicotine dependence: Secondary | ICD-10-CM | POA: Diagnosis not present

## 2019-03-18 DIAGNOSIS — Y92039 Unspecified place in apartment as the place of occurrence of the external cause: Secondary | ICD-10-CM | POA: Diagnosis not present

## 2019-03-18 DIAGNOSIS — S50812A Abrasion of left forearm, initial encounter: Secondary | ICD-10-CM | POA: Diagnosis not present

## 2019-03-18 DIAGNOSIS — Y939 Activity, unspecified: Secondary | ICD-10-CM | POA: Insufficient documentation

## 2019-03-18 DIAGNOSIS — Y999 Unspecified external cause status: Secondary | ICD-10-CM | POA: Insufficient documentation

## 2019-03-18 DIAGNOSIS — W19XXXA Unspecified fall, initial encounter: Secondary | ICD-10-CM

## 2019-03-18 DIAGNOSIS — I10 Essential (primary) hypertension: Secondary | ICD-10-CM | POA: Diagnosis not present

## 2019-03-18 DIAGNOSIS — S0081XA Abrasion of other part of head, initial encounter: Secondary | ICD-10-CM | POA: Diagnosis not present

## 2019-03-18 DIAGNOSIS — Z79899 Other long term (current) drug therapy: Secondary | ICD-10-CM | POA: Insufficient documentation

## 2019-03-18 DIAGNOSIS — S0990XA Unspecified injury of head, initial encounter: Secondary | ICD-10-CM

## 2019-03-18 DIAGNOSIS — T07XXXA Unspecified multiple injuries, initial encounter: Secondary | ICD-10-CM | POA: Diagnosis present

## 2019-03-18 NOTE — ED Triage Notes (Signed)
EMS reported pt had an unwitnessed fall by falling out of a chair. Pt denies LOC but he does endorse slight pain in the back oh his neck & head. There is an abrasion to his forehead from floor impact, 2 skin tears to his Lt arm & 1 to the Rt wrist. Pt arrived to ED A/Ox4.

## 2019-03-18 NOTE — Discharge Instructions (Signed)
Your work-up today was overall reassuring.  Your wounds on her arms appear to be superficial skin tears and did not need active repair.  Your tetanus was reportedly up-to-date.  Your imaging of your head, neck, and chest were reassuring without traumatic injuries.  Given the work-up, we feel you are safe for discharge home.  Please follow-up with your primary doctor and rest.  If any symptoms change or worsen, please return to the nearest emergency department.

## 2019-03-18 NOTE — ED Provider Notes (Signed)
[facility] EMERGENCY DEPARTMENT Provider Note   CSN: [id number] Arrival date & time: 03/18/19  0725     History   Chief Complaint Chief Complaint  Patient presents with   Fall    HPI Billy [name] is a 85 y.o. male.     The history is provided by the patient, the EMS personnel and medical records. No language interpreter was used.  Fall This is a new problem. The current episode started less than 1 hour ago. The problem occurs rarely. The problem has been resolved. Associated symptoms include headaches. Pertinent negatives include no chest pain, no abdominal pain and no shortness of breath. Nothing aggravates the symptoms. Nothing relieves the symptoms. He has tried nothing for the symptoms. The treatment provided no relief.    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[place])    Stroke [facility])    TIA    Patient Active Problem List   Diagnosis Date Noted   TIA (transient ischemic attack) 12/23/2017   Stroke-like symptoms 12/22/2017   Overactive bladder    Hypertension    Hypercholesterolemia    Dementia ([place])    Pituitary mass [facility])     Past Surgical History:  Procedure Laterality Date   CYSTOSCOPY W/ RETROGRADES Bilateral 07/23/2018   Procedure: CYSTOSCOPY WITH RETROGRADE PYELOGRAM;  Surgeon: Ardis Hughs, MD;  Location: [facility] ORS;  Service: Urology;  Laterality: Bilateral;   EYE SURGERY     bilateral cataract surgery with lens implants   TRANSURETHRAL RESECTION OF BLADDER TUMOR WITH MITOMYCIN-C N/A 07/23/2018   Procedure: TRANSURETHRAL RESECTION OF BLADDER TUMOR WITH GEMCITABINE;  Surgeon: Ardis Hughs, MD;  Location: [facility] ORS;  Service: Urology;  Laterality: N/A;   TRANSURETHRAL RESECTION OF PROSTATE     x 2        Home Medications    Prior to Admission medications   Medication Sig Start  Date End Date Taking? Authorizing Provider  acetaminophen (TYLENOL) 650 MG CR tablet Take 650 mg by mouth daily.    [provider]  aspirin EC 81 MG EC tablet Take 1 tablet (81 mg total) by mouth daily. 12/24/17   Geradine Girt, DO  atorvastatin (LIPITOR) 10 MG tablet Take 10 mg by mouth at bedtime.    [provider]  donepezil (ARICEPT) 10 MG tablet Take 10 mg by mouth at bedtime.    [provider]  metoprolol tartrate (LOPRESSOR) 25 MG tablet Take 12.5 mg by mouth daily.    [provider]  mirabegron ER (MYRBETRIQ) 50 MG TB24 tablet Take 50 mg by mouth daily.    [provider]  Multiple Vitamins-Minerals (CENTRUM SILVER 50+MEN PO) Take 1 tablet by mouth daily.    [provider]  Multiple Vitamins-Minerals (PRESERVISION AREDS 2+MULTI VIT PO) Take 1 capsule by mouth 2 (two) times daily.    [provider]  phenazopyridine (PYRIDIUM) 200 MG tablet Take 1 tablet (200 mg total) by mouth 3 (three) times daily as needed for pain. 07/23/18   Ardis Hughs, MD  traZODone (DESYREL) 50 MG tablet Take 25 mg by mouth at bedtime.     [provider]    Family History Family History  Problem Relation Age of Onset   Hypertension Mother    Hypertension Father     Social History Social History  Tobacco Use   Smoking status: Former Smoker    Types: Pipe   Smokeless tobacco: Never Used  Substance Use Topics   Alcohol use: Not Currently   Drug use: Not Currently     Allergies   Patient has no known allergies.   Review of Systems Review of Systems  Constitutional: Negative for chills, diaphoresis, fatigue and fever.  HENT: Negative for congestion.   Eyes: Negative for photophobia and visual disturbance.  Respiratory: Negative for cough, chest tightness, shortness of breath, wheezing and stridor.   Cardiovascular: Negative for chest pain, palpitations and leg swelling.  Gastrointestinal: Negative for  abdominal pain, constipation, diarrhea, nausea and vomiting.  Genitourinary: Negative for flank pain and frequency.  Musculoskeletal: Positive for neck pain. Negative for back pain and neck stiffness.  Skin: Positive for wound (abrasions and skin tears). Negative for rash.  Neurological: Positive for headaches. Negative for dizziness, weakness and light-headedness.  Psychiatric/Behavioral: Negative for agitation.  All other systems reviewed and are negative.    Physical Exam Updated Vital Signs BP 118/69 (BP Location: Right Arm)    Pulse 70    Temp 97.8 F (36.6 C) (Oral)    Resp 20    SpO2 97%   Physical Exam Vitals signs and nursing note reviewed.  Constitutional:      General: He is not in acute distress.    Appearance: Normal appearance. He is well-developed. He is not ill-appearing, toxic-appearing or diaphoretic.  HENT:     Head: Abrasion present.      Nose: Nose normal. No congestion or rhinorrhea.  Eyes:     Extraocular Movements: Extraocular movements intact.     Conjunctiva/sclera: Conjunctivae normal.     Pupils: Pupils are equal, round, and reactive to light.  Neck:     Musculoskeletal: Neck supple. Muscular tenderness present.   Cardiovascular:     Rate and Rhythm: Normal rate and regular rhythm.     Heart sounds: No murmur.  Pulmonary:     Effort: Pulmonary effort is normal. No respiratory distress.     Breath sounds: Normal breath sounds. No wheezing, rhonchi or rales.  Chest:     Chest wall: No tenderness.  Abdominal:     General: Abdomen is flat.     Palpations: Abdomen is soft.     Tenderness: There is no abdominal tenderness. There is no right CVA tenderness or left CVA tenderness.  Musculoskeletal:        General: No tenderness.       Arms:     Right lower leg: No edema.     Left lower leg: No edema.     Comments: Symmetric grip strength and pulses and sensation in hands.  Skin:    General: Skin is warm and dry.     Capillary Refill: Capillary  refill takes less than 2 seconds.     Findings: No erythema.  Neurological:     General: No focal deficit present.     Mental Status: He is alert.     GCS: GCS eye subscore is 4. GCS verbal subscore is 5. GCS motor subscore is 6.     Sensory: No sensory deficit.     Motor: No weakness, abnormal muscle tone or seizure activity.  Psychiatric:        Mood and Affect: Mood normal.      ED Treatments / Results  Labs (all labs ordered are listed, but only abnormal results are displayed) Labs Reviewed - No data to display  EKG EKG Interpretation  Date/Time:  Thursday March 18 2019 07:33:35 EDT Ventricular Rate:  69 PR Interval:    QRS Duration: 147 QT Interval:  444 QTC Calculation: 476 R Axis:   -39 Text Interpretation:  Age not entered, assumed to be  85 years old for purpose of ECG interpretation Sinus rhythm Ventricular premature complex Prolonged PR interval Consider left atrial enlargement IVCD, consider atypical RBBB Consider anterior infarct when compared to prior, no significant changes seen.  No STEMI Confirmed by Antony Blackbird [phone]) on 03/18/2019 7:48:19 AM   Radiology Ct Head Wo Contrast  Result Date: 03/18/2019 CLINICAL DATA:  Status post fall with a blow to the head. Initial encounter. EXAM: CT HEAD WITHOUT CONTRAST TECHNIQUE: Contiguous axial images were obtained from the base of the skull through the vertex without intravenous contrast. COMPARISON:  Head CT 12/22/2017. FINDINGS: Brain: No evidence of acute infarction, hemorrhage, hydrocephalus, extra-axial collection or mass lesion/mass effect. Atrophy and chronic microvascular ischemic change noted. Vascular: No hyperdense vessel or unexpected calcification. Skull: Normal. Negative for fracture or focal lesion. Sinuses/Orbits: Status post cataract surgery.  Otherwise negative. Other: None. IMPRESSION: No acute abnormality. Atrophy and chronic microvascular ischemic change. Electronically Signed   By: Inge Rise  M.D.   On: 03/18/2019 08:34   Ct Cervical Spine Wo Contrast  Result Date: 03/18/2019 CLINICAL DATA:  Status post fall this morning with a blow to the head. Initial encounter. EXAM: CT CERVICAL SPINE WITHOUT CONTRAST TECHNIQUE: Multidetector CT imaging of the cervical spine was performed without intravenous contrast. Multiplanar CT image reconstructions were also generated. COMPARISON:  None. FINDINGS: Alignment: Trace anterolisthesis C6 on C7 and C7 on T1 due to facet arthropathy noted. Skull base and vertebrae: No acute fracture. No primary bone lesion or focal pathologic process. Failure fusion of the posterior arch of C1 incidentally noted. Soft tissues and spinal canal: No prevertebral fluid or swelling. No visible canal hematoma. Disc levels: Mild loss of disc space height at C6-7 and C7-T1 noted. Upper chest: Lung apices clear. Other: None. IMPRESSION: No acute abnormality. Mild cervical spondylosis. Electronically Signed   By: Inge Rise M.D.   On: 03/18/2019 08:37   Dg Chest Portable 1 View  Result Date: 03/18/2019 CLINICAL DATA:  85 year old male status post unwitnessed fall from chair. EXAM: PORTABLE CHEST 1 VIEW COMPARISON:  CTA head and neck 12/22/2017. FINDINGS: Portable AP semi upright view at 0834 hours. Calcified aortic atherosclerosis. Mediastinal contours appear stable from the scout view of the prior CTA. Tortuous proximal great vessels. Azygos fissure. Lung volumes and ventilation also appear not significantly changed. Mild atelectasis or scarring at the left lung base. No pneumothorax, pleural effusion or confluent opacity. Osteopenia. No acute osseous abnormality identified. Paucity of bowel gas in the upper abdomen. IMPRESSION: 1. No acute cardiopulmonary abnormality or acute traumatic injury identified. 2. Ventilation appears stable from a 2019 neck CTA with mild chronic atelectasis or scarring. 3.  Aortic Atherosclerosis (ICD10-I70.0). Electronically Signed   By: Genevie Ann M.D.    On: 03/18/2019 09:11    Procedures Procedures (including critical care time)  Medications Ordered in ED Medications - No data to display   Initial Impression / Assessment and Plan / ED Course  I have reviewed the triage vital signs and the nursing notes.  Pertinent labs & imaging results that were available during my care of the patient were reviewed by me and considered in my medical decision making (see chart for details).        Rosanna Randy  Urieta is a 85 y.o. male with a past medical history significant for dementia, pituitary tumor, TIA, stroke, hypertension, and hypercholesterolemia who presents with a fall.  He reports it was a mechanical fall that he remembers the entire fall.  Patient reports that he has some mild headache and neck discomfort after a fall this morning.  He reports he was trying to sit on a ledge in his room when he slipped and fell.  He reports it was mechanical with no preceding symptoms.  He denies any vision changes, nausea, vomiting, or neurologic complaints.  He reports no pain in his arms or legs.  He does have a small abrasion to his forehead and abrasions on his bilateral forearms.  He denies any recent fevers, chills, congestion, cough, chest pain, shortness breath, palpitations, urinary symptoms GI symptoms congestion, or cough.  He reports that his tetanus is up-to-date.  On exam, patient has an abrasion to his left forehead.  Pupils are symmetric and reactive with normal extraocular movements.  Speech is clear.  Symmetric smile.  Normal sensation and strength in extremities.  Gait initially deferred while awaiting results of diagnostic work-up.  Patient did have some rhonchi in his right lower lobe compared to left lower lobe.  Chest and back nontender.  No other abrasions or injury seen on exam.  Abdomen nontender.  Had a discussion with the patient and we agreed to get a CT of the head and neck to look for traumatic injuries from his fall as well as x-ray  of his chest due to the rhonchi on exam.  Given his lack of any tenderness in his arms, suspect superficial skin tears and do not feel there is deeper injuries.  He had symmetric grip strength with good pulses and sensation.  Anticipate discharge if work-up is reassuring.  Diagnostic imaging showed no acute traumatic injuries.  No evidence of pneumonia.  Patient's son arrived and was a provide information the patient had pneumonia several weeks ago and this is likely causing the changes heard on exam.  Oxygen saturations were normal on my examination.  Given reassuring work-up, we feel he is a for discharge home with outpatient follow-up.  Family agreed with plan of care and patient discharged in good condition.   Final Clinical Impressions(s) / ED Diagnoses   Final diagnoses:  Fall, initial encounter  Injury of head, initial encounter  Multiple skin tears    ED Discharge Orders    None      Clinical Impression: 1. Fall, initial encounter   2. Injury of head, initial encounter   3. Multiple skin tears     Disposition: Discharge  Condition: Good  I have discussed the results, Dx and Tx plan with the pt(& family if present). He/she/they expressed understanding and agree(s) with the plan. Discharge instructions discussed at great length. Strict return precautions discussed and pt &/or family have verbalized understanding of the instructions. No further questions at time of discharge.    New Prescriptions   No medications on file    Follow Up: Reymundo Poll, MD [address] [place] [address] [phone]     Reymundo Poll, [place] [place] [address] [phone]     [facility] EMERGENCY DEPARTMENT [address] [id number] mc [place]       Acxel Dingee, Gwenyth Allegra, MD 03/18/19 1000

## 2019-03-18 NOTE — ED Notes (Signed)
X-ray at bedside at this time.

## 2019-03-18 NOTE — ED Notes (Signed)
Patient transported to CT 

## 2019-03-18 NOTE — ED Notes (Signed)
Pt taken back to [facility] by his son. NAD, VSS.

## 2019-03-25 ENCOUNTER — Ambulatory Visit: Payer: Medicare Other | Admitting: Adult Health

## 2019-03-29 ENCOUNTER — Encounter: Payer: Self-pay | Admitting: Adult Health

## 2019-04-16 ENCOUNTER — Other Ambulatory Visit: Payer: Self-pay

## 2019-04-16 ENCOUNTER — Emergency Department (HOSPITAL_COMMUNITY)
Admission: EM | Admit: 2019-04-16 | Discharge: 2019-04-16 | Disposition: A | Discharge status: Skilled Nursing Facility | Payer: Medicare Other | Attending: Emergency Medicine | Admitting: Emergency Medicine

## 2019-04-16 ENCOUNTER — Emergency Department (HOSPITAL_COMMUNITY): Payer: Medicare Other

## 2019-04-16 DIAGNOSIS — Z7982 Long term (current) use of aspirin: Secondary | ICD-10-CM | POA: Insufficient documentation

## 2019-04-16 DIAGNOSIS — Y939 Activity, unspecified: Secondary | ICD-10-CM | POA: Diagnosis not present

## 2019-04-16 DIAGNOSIS — S93402A Sprain of unspecified ligament of left ankle, initial encounter: Secondary | ICD-10-CM | POA: Diagnosis not present

## 2019-04-16 DIAGNOSIS — Z8673 Personal history of transient ischemic attack (TIA), and cerebral infarction without residual deficits: Secondary | ICD-10-CM | POA: Diagnosis not present

## 2019-04-16 DIAGNOSIS — S61412A Laceration without foreign body of left hand, initial encounter: Secondary | ICD-10-CM | POA: Diagnosis not present

## 2019-04-16 DIAGNOSIS — Z79899 Other long term (current) drug therapy: Secondary | ICD-10-CM | POA: Diagnosis not present

## 2019-04-16 DIAGNOSIS — Y929 Unspecified place or not applicable: Secondary | ICD-10-CM | POA: Insufficient documentation

## 2019-04-16 DIAGNOSIS — M25532 Pain in left wrist: Secondary | ICD-10-CM | POA: Diagnosis not present

## 2019-04-16 DIAGNOSIS — Y999 Unspecified external cause status: Secondary | ICD-10-CM | POA: Diagnosis not present

## 2019-04-16 DIAGNOSIS — Z87891 Personal history of nicotine dependence: Secondary | ICD-10-CM | POA: Insufficient documentation

## 2019-04-16 DIAGNOSIS — I10 Essential (primary) hypertension: Secondary | ICD-10-CM | POA: Diagnosis not present

## 2019-04-16 DIAGNOSIS — S99912A Unspecified injury of left ankle, initial encounter: Secondary | ICD-10-CM | POA: Diagnosis present

## 2019-04-16 DIAGNOSIS — W07XXXA Fall from chair, initial encounter: Secondary | ICD-10-CM | POA: Insufficient documentation

## 2019-04-16 LAB — CBC
HCT: 36.2 % — ABNORMAL LOW (ref 39.0–52.0)
Hemoglobin: 11.7 g/dL — ABNORMAL LOW (ref 13.0–17.0)
MCH: 29.3 pg (ref 26.0–34.0)
MCHC: 32.3 g/dL (ref 30.0–36.0)
MCV: 90.7 fL (ref 80.0–100.0)
Platelets: 268 10*3/uL (ref 150–400)
RBC: 3.99 MIL/uL — ABNORMAL LOW (ref 4.22–5.81)
RDW: 16.9 % — ABNORMAL HIGH (ref 11.5–15.5)
WBC: 7.1 10*3/uL (ref 4.0–10.5)
nRBC: 0 % (ref 0.0–0.2)

## 2019-04-16 LAB — BASIC METABOLIC PANEL
Anion gap: 11 (ref 5–15)
BUN: 19 mg/dL (ref 8–23)
CO2: 21 mmol/L — ABNORMAL LOW (ref 22–32)
Calcium: 9 mg/dL (ref 8.9–10.3)
Chloride: 104 mmol/L (ref 98–111)
Creatinine, Ser: 0.83 mg/dL (ref 0.61–1.24)
GFR calc Af Amer: >60 mL/min (ref >60)
GFR calc non Af Amer: >60 mL/min (ref >60)
Glucose, Bld: 97 mg/dL (ref 70–99)
Potassium: 3.8 mmol/L (ref 3.5–5.1)
Sodium: 136 mmol/L (ref 135–145)

## 2019-04-16 MED ORDER — ACETAMINOPHEN 325 MG PO TABS
650.0000 mg | ORAL_TABLET | Freq: Once | ORAL | Status: AC
  Administered 2019-04-16: 15:00:00 650 mg via ORAL
  Filled 2019-04-16: qty 2

## 2019-04-16 NOTE — ED Triage Notes (Addendum)
Pt arrived to ED from [facility] assisted living via [facility], facility sent him out for an unwitnessed fall, unknown what he hit or how he fell, EMS reports pt was back in his chair upon their arrival and staff had no idea how he fell or got back up. Pt is alert to baseline, pt is not on any blood thinners. Skin tears present to left arm and pt c/o left ankle pain. EMS reports no obvious deformity to ankle.

## 2019-04-16 NOTE — ED Provider Notes (Signed)
[place] DEPT Provider Note   CSN: [id number] Arrival date & time: 04/16/19  1343     History   Chief Complaint Chief Complaint  Patient presents with  . Fall    umwitnessed    HPI Billy [name] is a 85 y.o. male.     HPI Patient presents the ED for evaluation after a fall.  Patient states this occurred a short time ago.  He was sitting in a chair and started to reach for something when he accidentally fell out of his chair and injured his ankle and wrist.  Patient states he is not sure if he twisted his left ankle but he is having pain and it hurts to apply any pressure.  He also tore the skin on his left wrist and is having pain in that area as well.  He denies any headache or head injury.  No neck pain.  No back pain.  No hip pain.  No numbness or weakness.  He denies having any fevers or chills or feeling ill recently. Past Medical History:  Diagnosis Date  . Hearing loss    hearing aids to both ears  . Hypercholesterolemia   . Hypertension   . Overactive bladder   . 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 Stroke [facility])    TIA    Patient Active Problem List   Diagnosis Date Noted  . TIA (transient ischemic attack) 12/23/2017  . Stroke-like symptoms 12/22/2017  . Overactive bladder   . Hypertension   . Hypercholesterolemia   . Dementia ([place])   . Pituitary mass [facility])     Past Surgical History:  Procedure Laterality Date  . CYSTOSCOPY W/ RETROGRADES Bilateral 07/23/2018   Procedure: CYSTOSCOPY WITH RETROGRADE PYELOGRAM;  Surgeon: Ardis Hughs, MD;  Location[facility];  Service: Urology;  Laterality: Bilateral;  . EYE SURGERY     bilateral cataract surgery with lens implants  . TRANSURETHRAL RESECTION OF BLADDER TUMOR WITH MITOMYCIN-C N/A 07/23/2018   Procedure: TRANSURETHRAL RESECTION OF BLADDER TUMOR WITH GEMCITABINE;  Surgeon: Ardis Hughs, MD;  [facility];  Service: Urology;  Laterality: N/A;  . TRANSURETHRAL RESECTION OF  PROSTATE     x 2        Home Medications    Prior to Admission medications   Medication Sig Start Date End Date Taking? Authorizing Provider  acetaminophen (TYLENOL) 650 MG CR tablet Take 650 mg by mouth daily.   Yes [provider]  aspirin EC 81 MG EC tablet Take 1 tablet (81 mg total) by mouth daily. 12/24/17  Yes Vann, Jessica U, DO  memantine (NAMENDA) 10 MG tablet Take 10 mg by mouth at bedtime.   Yes [provider]  metoprolol tartrate (LOPRESSOR) 25 MG tablet Take 12.5 mg by mouth daily.   Yes [provider]  Multiple Vitamins-Minerals (CENTRUM SILVER 50+MEN PO) Take 1 tablet by mouth daily.   Yes [provider]  Multiple Vitamins-Minerals (PRESERVISION AREDS 2+MULTI VIT PO) Take 1 capsule by mouth 2 (two) times daily.   Yes [provider]  risperiDONE (RISPERDAL) 0.5 MG tablet Take 0.5 mg by mouth every evening.    Yes [provider]  tamsulosin (FLOMAX) 0.4 MG CAPS capsule Take 0.4 mg by mouth daily after supper.   Yes [provider]  traZODone (DESYREL) 50 MG tablet Take 25 mg by mouth at bedtime.    Yes [provider]  phenazopyridine (PYRIDIUM) 200 MG tablet Take 1 tablet (200 mg  total) by mouth 3 (three) times daily as needed for pain. Patient not taking: Reported on 03/18/2019 07/23/18   Ardis Hughs, MD    Family History Family History  Problem Relation Age of Onset  . Hypertension Mother   . Hypertension Father     Social History Social History   Tobacco Use  . Smoking status: Former Smoker    Types: Pipe  . Smokeless tobacco: Never Used  Substance Use Topics  . Alcohol use: Not Currently  . Drug use: Not Currently     Allergies   Patient has no known allergies.   Review of Systems Review of Systems  All other systems reviewed and are negative.    Physical Exam Updated Vital Signs BP 131/64 (BP Location: Right Arm)   Pulse 79   Temp 98 F (36.7 C) (Oral)    Resp (!) 34   Ht 1.778 m (5\' 10" )   Wt 63.5 kg   SpO2 95%   BMI 20.09 kg/m   Physical Exam Vitals signs and nursing note reviewed.  Constitutional:      Appearance: He is well-developed. He is not ill-appearing, toxic-appearing or diaphoretic.  HENT:     Head: Normocephalic and atraumatic.     Right Ear: External ear normal.     Left Ear: External ear normal.  Eyes:     General: No scleral icterus.       Right eye: No discharge.        Left eye: No discharge.     Conjunctiva/sclera: Conjunctivae normal.  Neck:     Musculoskeletal: Neck supple.     Trachea: No tracheal deviation.  Cardiovascular:     Rate and Rhythm: Normal rate and regular rhythm.  Pulmonary:     Effort: Pulmonary effort is normal. No respiratory distress.     Breath sounds: Normal breath sounds. No stridor. No wheezing or rales.  Abdominal:     General: Bowel sounds are normal. There is no distension.     Palpations: Abdomen is soft.     Tenderness: There is no abdominal tenderness. There is no guarding or rebound.  Musculoskeletal:        General: Tenderness present.     Left wrist: He exhibits tenderness and laceration. He exhibits no deformity.     Left ankle: He exhibits swelling. Tenderness. Lateral malleolus and medial malleolus tenderness found.     Right lower leg: Edema present.     Left lower leg: Edema present.  Skin:    General: Skin is warm and dry.     Findings: No rash.  Neurological:     Mental Status: He is alert.     Cranial Nerves: No cranial nerve deficit (no facial droop, extraocular movements intact, no slurred speech).     Sensory: No sensory deficit.     Motor: No abnormal muscle tone or seizure activity.     Coordination: Coordination normal.      ED Treatments / Results  Labs (all labs ordered are listed, but only abnormal results are displayed) Labs Reviewed  CBC - Abnormal; Notable for the following components:      Result Value   RBC 3.99 (*)    Hemoglobin 11.7  (*)    HCT 36.2 (*)    RDW 16.9 (*)    All other components within normal limits  BASIC METABOLIC PANEL - Abnormal; Notable for the following components:   CO2 21 (*)    All other components within normal limits  EKG EKG Interpretation  Date/Time:  Friday April 16 2019 15:56:36 EDT Ventricular Rate:  77 PR Interval:    QRS Duration: 137 QT Interval:  431 QTC Calculation: 488 R Axis:   -35 Text Interpretation:  Sinus rhythm Prolonged PR interval IVCD, consider atypical RBBB Consider anterior infarct No significant change since last tracing Confirmed by Dorie Rank [phone]) on 04/16/2019 4:03:00 PM   Radiology Dg Wrist Complete Left  Result Date: 04/16/2019 CLINICAL DATA:  Fall with wrist pain EXAM: LEFT WRIST - COMPLETE 3+ VIEW COMPARISON:  None. FINDINGS: Bones appear osteopenic.  No fracture or malalignment. IMPRESSION: No acute osseous abnormality Electronically Signed   By: Donavan Foil M.D.   On: 04/16/2019 15:54   Dg Ankle Complete Left  Result Date: 04/16/2019 CLINICAL DATA:  Fall EXAM: LEFT ANKLE COMPLETE - 3+ VIEW COMPARISON:  None. FINDINGS: Bones appear osteopenic. Ankle mortise is symmetric. Generalized soft tissue swelling. Arthritis at the medial and lateral joint spaces. Possible tiny cortical avulsion at the tip of the lateral fibular malleolus. IMPRESSION: 1. Bones appear osteopenic 2. Possible acute avulsion at the tip of the lateral fibular malleolus 3. Degenerative changes of the ankle Electronically Signed   By: Donavan Foil M.D.   On: 04/16/2019 15:49   Dg Foot Complete Left  Result Date: 04/16/2019 CLINICAL DATA:  Fall with foot pain EXAM: LEFT FOOT - COMPLETE 3+ VIEW COMPARISON:  None. FINDINGS: Study is limited by osteopenia and flexion deformity at the MTP joints. Suspicion of lateral subluxation base of third proximal phalanx with respect to the head of the metatarsal. IMPRESSION: 1. Limited by flexed position at the MTP joints and osteopenia 2. Suspect  that there may be lateral subluxation base of the third proximal phalanx with respect to the head of third metatarsal Electronically Signed   By: Donavan Foil M.D.   On: 04/16/2019 15:52    Procedures Procedures (including critical care time)  Medications Ordered in ED Medications  acetaminophen (TYLENOL) tablet 650 mg (650 mg Oral Given 04/16/19 1511)     Initial Impression / Assessment and Plan / ED Course  I have reviewed the triage vital signs and the nursing notes.  Pertinent labs & imaging results that were available during my care of the patient were reviewed by me and considered in my medical decision making (see chart for details).   Patient presented to the ED for evaluation after mechanical fall.  Patient's laboratory tests are unremarkable.  He has a mild anemia but this is similar to previous values.  Electrolyte panel is unremarkable.  X-ray of his wrist does not show any fracture.  Foot x-ray does not show any fracture but there may be a possible lateral subluxation of his third proximal phalanx.  I do suspect this would be chronic and not related to his injury.  There is also a possible avulsion fracture of his distal fibula.  Patient was placed in an ASO splint.  I do not think he would be able to ambulate properly with a cam walker and he is not able to use crutches.  Patient can continue over-the-counter medications.  Discussed outpatient follow-up with orthopedics.  Patient's superficial skin tears were treated in the ED.  I debrided some of the dead tissue and tried to reapproximate the superficial epidermal layers.  The patient's wounds were irrigated by nursing staff and nonadhesive dressings were placed.  Final Clinical Impressions(s) / ED Diagnoses   Final diagnoses:  Sprain of left ankle, unspecified ligament, initial encounter  Skin tear of left hand without complication, initial encounter    ED Discharge Orders    None       Dorie Rank, MD 04/16/19 1627

## 2019-04-16 NOTE — Discharge Instructions (Signed)
The x-ray did show a possible small chip fracture.  This is an injury similar to an ankle sprain.  Use the brace for comfort.  Follow-up with an orthopedic doctor to make sure you continue to heal properly.  Keep a nonadhesive dressing over your skin injuries and monitor for any signs of infection

## 2019-04-16 NOTE — ED Notes (Signed)
Patient transported to x-ray. ?

## 2019-06-30 ENCOUNTER — Inpatient Hospital Stay (HOSPITAL_COMMUNITY)
Admission: EM | Admit: 2019-06-30 | Discharge: 2019-07-07 | DRG: 377 | Disposition: A | Discharge status: Skilled Nursing Facility | Payer: Medicare Other | Source: Skilled Nursing Facility | Attending: Family Medicine | Admitting: Family Medicine

## 2019-06-30 ENCOUNTER — Emergency Department (HOSPITAL_COMMUNITY): Payer: Medicare Other

## 2019-06-30 ENCOUNTER — Other Ambulatory Visit: Payer: Self-pay

## 2019-06-30 ENCOUNTER — Encounter (HOSPITAL_COMMUNITY): Payer: Self-pay | Admitting: Emergency Medicine

## 2019-06-30 DIAGNOSIS — N179 Acute kidney failure, unspecified: Secondary | ICD-10-CM | POA: Diagnosis present

## 2019-06-30 DIAGNOSIS — K920 Hematemesis: Secondary | ICD-10-CM | POA: Diagnosis present

## 2019-06-30 DIAGNOSIS — E872 Acidosis: Secondary | ICD-10-CM | POA: Diagnosis present

## 2019-06-30 DIAGNOSIS — Z23 Encounter for immunization: Secondary | ICD-10-CM | POA: Diagnosis present

## 2019-06-30 DIAGNOSIS — E860 Dehydration: Secondary | ICD-10-CM | POA: Diagnosis present

## 2019-06-30 DIAGNOSIS — I10 Essential (primary) hypertension: Secondary | ICD-10-CM | POA: Diagnosis present

## 2019-06-30 DIAGNOSIS — F0390 Unspecified dementia without behavioral disturbance: Secondary | ICD-10-CM | POA: Diagnosis present

## 2019-06-30 DIAGNOSIS — I679 Cerebrovascular disease, unspecified: Secondary | ICD-10-CM | POA: Diagnosis present

## 2019-06-30 DIAGNOSIS — Z66 Do not resuscitate: Secondary | ICD-10-CM | POA: Diagnosis present

## 2019-06-30 DIAGNOSIS — N139 Obstructive and reflux uropathy, unspecified: Secondary | ICD-10-CM | POA: Diagnosis present

## 2019-06-30 DIAGNOSIS — Z7189 Other specified counseling: Secondary | ICD-10-CM | POA: Diagnosis not present

## 2019-06-30 DIAGNOSIS — E861 Hypovolemia: Secondary | ICD-10-CM

## 2019-06-30 DIAGNOSIS — I959 Hypotension, unspecified: Secondary | ICD-10-CM | POA: Diagnosis not present

## 2019-06-30 DIAGNOSIS — R6520 Severe sepsis without septic shock: Secondary | ICD-10-CM | POA: Diagnosis not present

## 2019-06-30 DIAGNOSIS — Z9221 Personal history of antineoplastic chemotherapy: Secondary | ICD-10-CM

## 2019-06-30 DIAGNOSIS — E7800 Pure hypercholesterolemia, unspecified: Secondary | ICD-10-CM | POA: Diagnosis present

## 2019-06-30 DIAGNOSIS — Z8546 Personal history of malignant neoplasm of prostate: Secondary | ICD-10-CM

## 2019-06-30 DIAGNOSIS — K529 Noninfective gastroenteritis and colitis, unspecified: Secondary | ICD-10-CM | POA: Diagnosis present

## 2019-06-30 DIAGNOSIS — K311 Adult hypertrophic pyloric stenosis: Secondary | ICD-10-CM

## 2019-06-30 DIAGNOSIS — R571 Hypovolemic shock: Secondary | ICD-10-CM | POA: Diagnosis present

## 2019-06-30 DIAGNOSIS — N3281 Overactive bladder: Secondary | ICD-10-CM | POA: Diagnosis present

## 2019-06-30 DIAGNOSIS — E785 Hyperlipidemia, unspecified: Secondary | ICD-10-CM | POA: Diagnosis present

## 2019-06-30 DIAGNOSIS — I9589 Other hypotension: Secondary | ICD-10-CM | POA: Diagnosis not present

## 2019-06-30 DIAGNOSIS — R55 Syncope and collapse: Secondary | ICD-10-CM | POA: Diagnosis present

## 2019-06-30 DIAGNOSIS — E876 Hypokalemia: Secondary | ICD-10-CM | POA: Diagnosis present

## 2019-06-30 DIAGNOSIS — Z79899 Other long term (current) drug therapy: Secondary | ICD-10-CM

## 2019-06-30 DIAGNOSIS — R933 Abnormal findings on diagnostic imaging of other parts of digestive tract: Secondary | ICD-10-CM | POA: Diagnosis not present

## 2019-06-30 DIAGNOSIS — D62 Acute posthemorrhagic anemia: Secondary | ICD-10-CM | POA: Diagnosis not present

## 2019-06-30 DIAGNOSIS — K5900 Constipation, unspecified: Secondary | ICD-10-CM | POA: Diagnosis present

## 2019-06-30 DIAGNOSIS — K921 Melena: Secondary | ICD-10-CM | POA: Diagnosis not present

## 2019-06-30 DIAGNOSIS — K922 Gastrointestinal hemorrhage, unspecified: Secondary | ICD-10-CM | POA: Diagnosis present

## 2019-06-30 DIAGNOSIS — H9190 Unspecified hearing loss, unspecified ear: Secondary | ICD-10-CM | POA: Diagnosis present

## 2019-06-30 DIAGNOSIS — J690 Pneumonitis due to inhalation of food and vomit: Secondary | ICD-10-CM | POA: Diagnosis present

## 2019-06-30 DIAGNOSIS — G459 Transient cerebral ischemic attack, unspecified: Secondary | ICD-10-CM | POA: Diagnosis present

## 2019-06-30 DIAGNOSIS — Z8673 Personal history of transient ischemic attack (TIA), and cerebral infarction without residual deficits: Secondary | ICD-10-CM

## 2019-06-30 DIAGNOSIS — R0682 Tachypnea, not elsewhere classified: Secondary | ICD-10-CM

## 2019-06-30 DIAGNOSIS — Z8551 Personal history of malignant neoplasm of bladder: Secondary | ICD-10-CM

## 2019-06-30 DIAGNOSIS — Z7982 Long term (current) use of aspirin: Secondary | ICD-10-CM

## 2019-06-30 DIAGNOSIS — Z515 Encounter for palliative care: Secondary | ICD-10-CM | POA: Diagnosis not present

## 2019-06-30 DIAGNOSIS — Z87891 Personal history of nicotine dependence: Secondary | ICD-10-CM

## 2019-06-30 DIAGNOSIS — R54 Age-related physical debility: Secondary | ICD-10-CM | POA: Diagnosis present

## 2019-06-30 DIAGNOSIS — A419 Sepsis, unspecified organism: Secondary | ICD-10-CM | POA: Diagnosis not present

## 2019-06-30 DIAGNOSIS — Z20828 Contact with and (suspected) exposure to other viral communicable diseases: Secondary | ICD-10-CM | POA: Diagnosis present

## 2019-06-30 DIAGNOSIS — Z8249 Family history of ischemic heart disease and other diseases of the circulatory system: Secondary | ICD-10-CM

## 2019-06-30 DIAGNOSIS — Z9079 Acquired absence of other genital organ(s): Secondary | ICD-10-CM

## 2019-06-30 DIAGNOSIS — J189 Pneumonia, unspecified organism: Secondary | ICD-10-CM

## 2019-06-30 LAB — COMPREHENSIVE METABOLIC PANEL
ALT: 24 U/L (ref 0–44)
AST: 43 U/L — ABNORMAL HIGH (ref 15–41)
Albumin: 2.7 g/dL — ABNORMAL LOW (ref 3.5–5.0)
Alkaline Phosphatase: 135 U/L — ABNORMAL HIGH (ref 38–126)
Anion gap: 12 (ref 5–15)
BUN: 29 mg/dL — ABNORMAL HIGH (ref 8–23)
CO2: 15 mmol/L — ABNORMAL LOW (ref 22–32)
Calcium: 8.4 mg/dL — ABNORMAL LOW (ref 8.9–10.3)
Chloride: 111 mmol/L (ref 98–111)
Creatinine, Ser: 1.28 mg/dL — ABNORMAL HIGH (ref 0.61–1.24)
GFR calc Af Amer: 57 mL/min — ABNORMAL LOW (ref >60)
GFR calc non Af Amer: 49 mL/min — ABNORMAL LOW (ref >60)
Glucose, Bld: 166 mg/dL — ABNORMAL HIGH (ref 70–99)
Potassium: 3.6 mmol/L (ref 3.5–5.1)
Sodium: 138 mmol/L (ref 135–145)
Total Bilirubin: 0.3 mg/dL (ref 0.3–1.2)
Total Protein: 6.1 g/dL — ABNORMAL LOW (ref 6.5–8.1)

## 2019-06-30 LAB — CBC WITH DIFFERENTIAL/PLATELET
Abs Immature Granulocytes: 0.15 10*3/uL — ABNORMAL HIGH (ref 0.00–0.07)
Basophils Absolute: 0.1 10*3/uL (ref 0.0–0.1)
Basophils Relative: 0 %
Eosinophils Absolute: 0.1 10*3/uL (ref 0.0–0.5)
Eosinophils Relative: 1 %
HCT: 38.5 % — ABNORMAL LOW (ref 39.0–52.0)
Hemoglobin: 12 g/dL — ABNORMAL LOW (ref 13.0–17.0)
Immature Granulocytes: 1 %
Lymphocytes Relative: 13 %
Lymphs Abs: 2.5 10*3/uL (ref 0.7–4.0)
MCH: 30 pg (ref 26.0–34.0)
MCHC: 31.2 g/dL (ref 30.0–36.0)
MCV: 96.3 fL (ref 80.0–100.0)
Monocytes Absolute: 1.7 10*3/uL — ABNORMAL HIGH (ref 0.1–1.0)
Monocytes Relative: 9 %
Neutro Abs: 14.2 10*3/uL — ABNORMAL HIGH (ref 1.7–7.7)
Neutrophils Relative %: 76 %
Platelets: 297 10*3/uL (ref 150–400)
RBC: 4 MIL/uL — ABNORMAL LOW (ref 4.22–5.81)
RDW: 15.7 % — ABNORMAL HIGH (ref 11.5–15.5)
WBC: 18.6 10*3/uL — ABNORMAL HIGH (ref 4.0–10.5)
nRBC: 0 % (ref 0.0–0.2)

## 2019-06-30 LAB — TYPE AND SCREEN
ABO/RH(D): A POS
Antibody Screen: NEGATIVE

## 2019-06-30 LAB — LACTIC ACID, PLASMA: Lactic Acid, Venous: 3.8 mmol/L (ref 0.5–1.9)

## 2019-06-30 LAB — POC OCCULT BLOOD, ED: Fecal Occult Bld: NEGATIVE

## 2019-06-30 LAB — PROTIME-INR
INR: 1.2 (ref 0.8–1.2)
Prothrombin Time: 15.4 seconds — ABNORMAL HIGH (ref 11.4–15.2)

## 2019-06-30 LAB — ABO/RH: ABO/RH(D): A POS

## 2019-06-30 MED ORDER — LACTATED RINGERS IV BOLUS
1000.0000 mL | Freq: Once | INTRAVENOUS | Status: AC
  Administered 2019-06-30: 1000 mL via INTRAVENOUS

## 2019-06-30 MED ORDER — PANTOPRAZOLE SODIUM 40 MG IV SOLR
40.0000 mg | Freq: Once | INTRAVENOUS | Status: AC
  Administered 2019-06-30: 40 mg via INTRAVENOUS

## 2019-06-30 MED ORDER — SODIUM CHLORIDE 0.9 % IV SOLN
INTRAVENOUS | Status: DC
  Administered 2019-07-01 – 2019-07-03 (×3): via INTRAVENOUS

## 2019-06-30 MED ORDER — IOHEXOL 300 MG/ML  SOLN
100.0000 mL | Freq: Once | INTRAMUSCULAR | Status: AC | PRN
  Administered 2019-06-30: 100 mL via INTRAVENOUS

## 2019-06-30 MED ORDER — SODIUM CHLORIDE 0.9 % IV BOLUS
1000.0000 mL | Freq: Once | INTRAVENOUS | Status: AC
  Administered 2019-06-30: 1000 mL via INTRAVENOUS

## 2019-06-30 MED ORDER — SODIUM CHLORIDE 0.9 % IV SOLN
8.0000 mg/h | INTRAVENOUS | Status: DC
  Administered 2019-07-01: 8 mg/h via INTRAVENOUS
  Filled 2019-06-30 (×3): qty 80

## 2019-06-30 MED ORDER — BISACODYL 10 MG RE SUPP: 10.0000 mg | Freq: Once | RECTAL | Status: DC

## 2019-06-30 NOTE — ED Notes (Signed)
Pharmacy called x2. protonix being mixed now.

## 2019-06-30 NOTE — ED Provider Notes (Signed)
[facility] EMERGENCY DEPARTMENT Provider Note   CSN: [id number] Arrival date & time: 06/30/19  1933     History   Chief Complaint Chief Complaint  Patient presents with   Hypotension    HPI Pearlie Yoshikawa is a 85 y.o. male past medical history of hypertension, hypercholesterolemia, overactive bladder, pituitary mass who presents for evaluation of diarrhea and hematemesis.  Per EMS, patient lives in a nursing home facility and was called out due to multiple episodes of diarrhea today.  Staff did not notice any melena or bright red blood in stools.  On EMS arrival, patient an episode of hematemesis.  He also had an near syncopal event and was hypotensive.  He never had any LOC.  On ED arrival, he does not have any pain but just states he is cold.  He states he is not on any blood thinners.  He states he does not think he is ever seen a GI doctor.  He states that he has not been sick recently except for the diarrhea.  No other symptoms.  He is not a current smoker. He denies any alcohol use.     The history is provided by the patient.    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[place])    Stroke [facility])    TIA    Patient Active Problem List   Diagnosis Date Noted   Upper GI bleed 06/30/2019   TIA (transient ischemic attack) 12/23/2017   Stroke-like symptoms 12/22/2017   Overactive bladder    Hypertension    Hypercholesterolemia    Dementia (HCC)    Pituitary mass [facility])     Past Surgical History:  Procedure Laterality Date   CYSTOSCOPY W/ RETROGRADES Bilateral 07/23/2018   Procedure: CYSTOSCOPY WITH RETROGRADE PYELOGRAM;  Surgeon: Ardis Hughs, MD;  Location: [facility];  Service: Urology;  Laterality: Bilateral;   EYE SURGERY     bilateral cataract surgery with lens implants   TRANSURETHRAL RESECTION OF BLADDER  TUMOR WITH MITOMYCIN-C N/A 07/23/2018   Procedure: TRANSURETHRAL RESECTION OF BLADDER TUMOR WITH GEMCITABINE;  Surgeon: Ardis Hughs, MD;  Location: [facility];  Service: Urology;  Laterality: N/A;   TRANSURETHRAL RESECTION OF PROSTATE     x 2        Home Medications    Prior to Admission medications   Medication Sig Start Date End Date Taking? Authorizing Provider  acetaminophen (TYLENOL) 650 MG CR tablet Take 650 mg by mouth daily.    [provider]  aspirin EC 81 MG EC tablet Take 1 tablet (81 mg total) by mouth daily. 12/24/17   Geradine Girt, DO  memantine (NAMENDA) 10 MG tablet Take 10 mg by mouth at bedtime.    [provider]  metoprolol tartrate (LOPRESSOR) 25 MG tablet Take 12.5 mg by mouth daily.    [provider]  Multiple Vitamins-Minerals (CENTRUM SILVER 50+MEN PO) Take 1 tablet by mouth daily.    [provider]  Multiple Vitamins-Minerals (PRESERVISION AREDS 2+MULTI VIT PO) Take 1 capsule by mouth 2 (two) times daily.    [provider]  phenazopyridine (PYRIDIUM) 200 MG tablet Take 1 tablet (200 mg total) by mouth 3 (three) times daily as needed for pain. Patient not taking: Reported on 03/18/2019 07/23/18   Ardis Hughs, MD  risperiDONE [facility])  0.5 MG tablet Take 0.5 mg by mouth every evening.     [provider]  tamsulosin (FLOMAX) 0.4 MG CAPS capsule Take 0.4 mg by mouth daily after supper.    [provider]  traZODone (DESYREL) 50 MG tablet Take 25 mg by mouth at bedtime.     [provider]    Family History Family History  Problem Relation Age of Onset   Hypertension Mother    Hypertension Father     Social History Social History   Tobacco Use   Smoking status: Former Smoker    Types: Pipe   Smokeless tobacco: Never Used  Substance Use Topics   Alcohol use: Not Currently   Drug use: Not Currently     Allergies   Patient has no known  allergies.   Review of Systems Review of Systems  Constitutional: Negative for fever.  Respiratory: Negative for cough and shortness of breath.   Cardiovascular: Negative for chest pain.  Gastrointestinal: Positive for diarrhea and vomiting. Negative for abdominal pain and nausea.  Genitourinary: Negative for dysuria and hematuria.  Neurological: Negative for headaches.  All other systems reviewed and are negative.    Physical Exam Updated Vital Signs BP 128/61    Pulse (!) 102    Temp (!) 97.5 F (36.4 C) (Oral)    Resp (!) 30    Ht 6' (1.829 m)    Wt 86.2 kg    SpO2 100%    BMI 25.77 kg/m   Physical Exam Vitals signs and nursing note reviewed. Exam conducted with a chaperone present.  Constitutional:      Appearance: Normal appearance. He is well-developed. He is ill-appearing.  HENT:     Head: Normocephalic and atraumatic.  Eyes:     General: Lids are normal.     Conjunctiva/sclera: Conjunctivae normal.     Pupils: Pupils are equal, round, and reactive to light.  Neck:     Musculoskeletal: Full passive range of motion without pain.  Cardiovascular:     Rate and Rhythm: Normal rate and regular rhythm.     Pulses: Normal pulses.          Radial pulses are 2+ on the right side and 2+ on the left side.       Dorsalis pedis pulses are 2+ on the right side and 2+ on the left side.     Heart sounds: Normal heart sounds. No murmur. No friction rub. No gallop.   Pulmonary:     Effort: Pulmonary effort is normal.     Breath sounds: Normal breath sounds.     Comments: Lungs clear to auscultation bilaterally.  Symmetric chest rise.  No wheezing, rales, rhonchi. Abdominal:     Palpations: Abdomen is soft. Abdomen is not rigid.     Tenderness: There is no abdominal tenderness. There is no guarding.     Comments: Abdomen is soft, non-distended, non-tender. No rigidity, No guarding. No peritoneal signs.  Genitourinary:    Rectum: Guaiac result negative.     Comments: The exam was  performed with a chaperone present. Digital Rectal Exam reveals sphincter with good tone. No external hemorrhoids. No masses or fissures. Stool color is brown with no overt blood. Musculoskeletal: Normal range of motion.  Skin:    Capillary Refill: Capillary refill takes less than 2 seconds.     Coloration: Skin is pale.  Neurological:     Mental Status: He is alert and oriented to person, place, and time.  Comments: Alert and oriented x3 Answers questions Follows commands   Psychiatric:        Speech: Speech normal.      ED Treatments / Results  Labs (all labs ordered are listed, but only abnormal results are displayed) Labs Reviewed  COMPREHENSIVE METABOLIC PANEL - Abnormal; Notable for the following components:      Result Value   CO2 15 (*)    Glucose, Bld 166 (*)    BUN 29 (*)    Creatinine, Ser 1.28 (*)    Calcium 8.4 (*)    Total Protein 6.1 (*)    Albumin 2.7 (*)    AST 43 (*)    Alkaline Phosphatase 135 (*)    GFR calc non Af Amer 49 (*)    GFR calc Af Amer 57 (*)    All other components within normal limits  CBC WITH DIFFERENTIAL/PLATELET - Abnormal; Notable for the following components:   WBC 18.6 (*)    RBC 4.00 (*)    Hemoglobin 12.0 (*)    HCT 38.5 (*)    RDW 15.7 (*)    Neutro Abs 14.2 (*)    Monocytes Absolute 1.7 (*)    Abs Immature Granulocytes 0.15 (*)    All other components within normal limits  PROTIME-INR - Abnormal; Notable for the following components:   Prothrombin Time 15.4 (*)    All other components within normal limits  LACTIC ACID, PLASMA - Abnormal; Notable for the following components:   Lactic Acid, Venous 3.8 (*)    All other components within normal limits  SARS CORONAVIRUS 2 (TAT 6-24 HRS)  CULTURE, BLOOD (ROUTINE X 2)  CULTURE, BLOOD (ROUTINE X 2)  URINALYSIS, ROUTINE W REFLEX MICROSCOPIC  LACTIC ACID, PLASMA  CBC  CBC  BASIC METABOLIC PANEL  POC OCCULT BLOOD, ED  TYPE AND SCREEN  ABO/RH     EKG None  Radiology Ct Abdomen Pelvis W Contrast  Result Date: 06/30/2019 CLINICAL DATA:  Hematemesis. Generalized weakness and diarrhea. EXAM: CT ABDOMEN AND PELVIS WITH CONTRAST TECHNIQUE: Multidetector CT imaging of the abdomen and pelvis was performed using the standard protocol following bolus administration of intravenous contrast. CONTRAST:  158mL OMNIPAQUE IOHEXOL 300 MG/ML  SOLN COMPARISON:  Abdominal radiographs dated 06/30/2019 FINDINGS: Lower chest: Elevation of the left hemidiaphragm. Bronchiectasis at the lung bases with slight accentuation of the interstitial markings and slight tree in bud densities at the right lung base. Slight atelectasis at the left lung base with pleural calcification at the left lung base posteriorly. No discrete effusion. Hepatobiliary: Liver parenchyma is normal. Dependent subtle densities in the gallbladder probably represent tiny stones. The gallbladder is not distended. No gallbladder wall thickening. Biliary tree is normal. Pancreas: Unremarkable. No pancreatic ductal dilatation or surrounding inflammatory changes. Spleen: Normal in size without focal abnormality. Adrenals/Urinary Tract: Adrenal glands are normal. No significant abnormality of the kidneys. No hydronephrosis. Bladder is normal. Stomach/Bowel: The stomach is distended with fluid. There is a slightly higher density material in the stomach which may represent ingested food or a bezoar or clot. The duodenum and small bowel are normal. Appendix is normal. Large amount of stool in the rectum. Moderate stool in the transverse and descending portions of the colon. Vascular/Lymphatic: Aortic atherosclerosis. No enlarged abdominal or pelvic lymph nodes. Reproductive: There is a focal area of enhancement at the base of the bladder to the left of midline measuring 12 x 20 x 7 mm. The patient has a history of resection of a bladder  tumor as well as resection of the prostate gland. Other: No abdominal wall  hernia or abnormality. No abdominopelvic ascites. Musculoskeletal: No acute or significant osseous findings. IMPRESSION: 1. Distended stomach filled with fluid and high density material which could represent a bezoar or clot. 2. Focal area of enhancement at the base of the bladder to the left of midline consistent with the patient's history of bladder tumor. This could represent scarring or tumor. 3. Large amount of stool in the rectum. 4. Probable tiny gallstones. 5. Elevation of the left hemidiaphragm. 6. Bronchiectasis at the lung bases with slight accentuation of the interstitial markings and tree in bud densities at the right lung base. 7. Aortic Atherosclerosis (ICD10-I70.0).a Electronically Signed   By: Lorriane Shire M.D.   On: 06/30/2019 21:52   Dg Abdomen Acute W/chest  Result Date: 06/30/2019 CLINICAL DATA:  GI bleed.  Concern for perforation. EXAM: DG ABDOMEN ACUTE W/ 1V CHEST COMPARISON:  03/18/2019 chest radiograph. FINDINGS: Stable cardiomediastinal silhouette with top-normal heart size. No pneumothorax. No pleural effusion. No pulmonary edema. Low lung volumes. Mild bibasilar scarring versus atelectasis. Questionable lucency under the left hemidiaphragm on the erect portable chest radiograph. No evidence of pneumatosis or pneumoperitoneum on the left lateral decubitus views. Relatively gasless abdomen with no dilated small bowel loops. Fluid levels scattered in the large bowel. No radiopaque nephrolithiasis. Degenerative changes in the lumbar spine. IMPRESSION: 1. Questionable lucency under the left hemidiaphragm on the upright portable chest radiograph, although with no evidence of pneumatosis or pneumoperitoneum on the left lateral decubitus views. If there is continued clinical concern for free air, dedicated CT of the abdomen and pelvis with oral and IV contrast would be recommended for further evaluation. 2. Low lung volumes with mild bibasilar scarring versus atelectasis. 3. Relatively  gasless abdomen with no specific findings of bowel obstruction. Scattered fluid levels in the large bowel. These results were called by telephone at the time of interpretation on 06/30/2019 at 8:59 pm to provider PA [facility] Omayra Tulloch , who verbally acknowledged these results. Electronically Signed   By: Ilona Sorrel M.D.   On: 06/30/2019 20:53    Procedures .Critical Care Performed by: Volanda Napoleon, PA-C Authorized by: Volanda Napoleon, PA-C   Critical care provider statement:    Critical care time (minutes):  45   Critical care was necessary to treat or prevent imminent or life-threatening deterioration of the following conditions: GI bleed.   Critical care was time spent personally by me on the following activities:  Discussions with consultants, evaluation of patient's response to treatment, examination of patient, ordering and performing treatments and interventions, ordering and review of laboratory studies, ordering and review of radiographic studies, pulse oximetry, re-evaluation of patient's condition, obtaining history from patient or surrogate and review of old charts   (including critical care time)  Medications Ordered in ED Medications  pantoprazole (PROTONIX) 80 mg in sodium chloride 0.9 % 250 mL (0.32 mg/mL) infusion (has no administration in time range)  pantoprazole (PROTONIX) injection 40 mg (40 mg Intravenous Given 06/30/19 1959)  lactated ringers bolus 1,000 mL (0 mLs Intravenous Stopped 06/30/19 2228)  iohexol (OMNIPAQUE) 300 MG/ML solution 100 mL (100 mLs Intravenous Contrast Given 06/30/19 2116)  sodium chloride 0.9 % bolus 1,000 mL (0 mLs Intravenous Stopped 06/30/19 2316)     Initial Impression / Assessment and Plan / ED Course  I have reviewed the triage vital signs and the nursing notes.  Pertinent labs & imaging results that were available during my  care of the patient were reviewed by me and considered in my medical decision making (see chart for  details).  Clinical Course as of Jun 30 2327  Wed Jun 30, 2851  7228 85 year old male DNR here from his facility after began having diarrhea today.  When EMS got there he began vomiting blood and had a syncopal event with low blood pressure.  Currently denies any pain only complaining of feeling cold.  Awake and alert.  Tachycardic.  Establishing IV access getting lab work EKG stool guaiac.  Will need admission.   [MB]  2206 ABO/RH(D): A POS Performed at [facility], [place] [address], [place], [place] [address]  [LL]    Clinical Course User Index [LL] Volanda Napoleon, PA-C [MB] Hayden Rasmussen, MD       85 year old male who presents for evaluation of diarrhea and hematemesis.  Initially EMS was called at facility for concerns of diarrhea and weakness.  On EMS arrival, he had an episode of hematemesis.  No history of GI bleeds, esophageal varices.  On initial ED arrival, he was hypotensive.  He is slightly tachypneic.  Vitals otherwise stable.  He is mentating appropriately and answering all questions appropriately.  No abdominal pain.  Rectal exam performed by myself showed no evidence of melena.  Concern for GI bleed versus infectious etiology.  Plan for fluids, labs.  INR is 1.2.  Fecal occult is negative.  CBC shows leukocytosis of 18.6, hemoglobin of 12.0.  CMP shows BUN of 29, creatinine of 1.28.  Lactic is elevated at 3.8.  Will give an additional liter of lactated Ringer's.   Portable chest x-ray and abdominal x-ray shows questionable lucency under left hemidiaphragm but does not see this on the left lateral decubitus.  We will proceed with CT on pelvis.  Reevaluation.  Patient is resting comfortably.  Blood pressure is improving slightly with fluids.  He is still mentating appropriately.  We will hold off on any pressors at this time.  At this time, we have no source of infection.  We will hold off on antibiotics.  We will continue for fluid resuscitation.  CT on pelvis  shows a distended stomach filled with fluid and high density material which could represent a bezoar or clot.  He also has a focal area of enhancement of the bladder to the left of midline consistent with history of bladder tumor.  Large amount of stool noted in the rectum.  Small tiny gallstones.  Elevation of left hemidiaphragm.  Discussed patient with Dr. Loletha Carrow (GI). Agrees with plan for admission. Will plan to consult.   Discussed patient with Dr. Flossie Buffy (hospitalist). Agrees for admission.   Portions of this note were generated with [employer]. Dictation errors may occur despite best attempts at proofreading.   Final Clinical Impressions(s) / ED Diagnoses   Final diagnoses:  Hematemesis, presence of nausea not specified  Hypotension, unspecified hypotension type    ED Discharge Orders    None       Desma Mcgregor 06/30/19 2328    Hayden Rasmussen, MD 07/01/19 [phone]

## 2019-06-30 NOTE — H&P (Addendum)
History and Physical    Billy [name] [id number] DOB: 10/11/1926 DOA: 06/30/2019  PCP: Reymundo Poll, MD  Patient coming from: [facility], Son at bedside  I have personally briefly reviewed patient's old medical records in [place]  Chief Complaint: diarrhea and weakness  HPI: Billy [name] is a 85 y.o. male with medical history significant of dementia, TIA, history of bladder and prostate cancer s/p transurethral resection of the bladder, hypertension, and hyperlipidemia who was sent by his SNF for concerns of diarrhea and weakness for an unknown period of time.  Upon EMS arrival, patient was noted to have hematemesis with bright red blood.  Blood pressure was found to be in systolic of 80.  Patient is unable to provide history given his dementia and son at bedside is unable to provide much history as he has been given minimal updates from the nursing facility.  Patient only complaining of intermittent ower abdominal pain.  Son denies patient having previous history of GI bleeding.  Denies that patient could be getting NSAIDs at nursing facility.  ED Course: He was afebrile and was initially hypotensive with blood pressure of 80 over 50s on arrival.  This is improved to 130/ 60s after 3 L of IV fluid bolus.  Rectal exam was performed by ED PA with only brown stool and negative fecal occult blood.  CBC shows leukocytosis of 18.6 and stable anemia with hemoglobin of 12 which is patient's baseline.  CMP shows glucose of 166, BUN of 29 and creatinine of 1.28 from prior of 0.83.  AST is mildly elevated at 43 with normal ALT. CT abdomen pelvis shows distended stomach filled with fluid like represent a bezoar or clot, focal area of enhancement at the base of the bladder consistent with his history of bladder tumor likely represents scarring or tumor, large amount of stool in the rectum. He was started on Protonix gtt and GI was consulted by ED PA with medical admission overnight for  upper GI bleed.   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[place])    Stroke [facility])    TIA    Past Surgical History:  Procedure Laterality Date   CYSTOSCOPY W/ RETROGRADES Bilateral 07/23/2018   Procedure: CYSTOSCOPY WITH RETROGRADE PYELOGRAM;  Surgeon: Ardis Hughs, MD;  Location: [facility];  Service: Urology;  Laterality: Bilateral;   EYE SURGERY     bilateral cataract surgery with lens implants   TRANSURETHRAL RESECTION OF BLADDER TUMOR WITH MITOMYCIN-C N/A 07/23/2018   Procedure: TRANSURETHRAL RESECTION OF BLADDER TUMOR WITH GEMCITABINE;  Surgeon: Ardis Hughs, MD;  Location: [facility];  Service: Urology;  Laterality: N/A;   TRANSURETHRAL RESECTION OF PROSTATE     x 2     reports that he has quit smoking. His smoking use included pipe. He has never used smokeless tobacco. He reports previous alcohol use. He reports previous drug use.  No Known Allergies  Family History  Problem Relation Age of Onset   Hypertension Mother    Hypertension Father      Prior to Admission medications   Medication Sig Start Date End Date Taking? Authorizing Provider  acetaminophen (TYLENOL) 650 MG CR tablet Take 650 mg by mouth daily.    [provider]  aspirin EC  81 MG EC tablet Take 1 tablet (81 mg total) by mouth daily. 12/24/17   Geradine Girt, DO  memantine (NAMENDA) 10 MG tablet Take 10 mg by mouth at bedtime.    [provider]  metoprolol tartrate (LOPRESSOR) 25 MG tablet Take 12.5 mg by mouth daily.    [provider]  Multiple Vitamins-Minerals (CENTRUM SILVER 50+MEN PO) Take 1 tablet by mouth daily.    [provider]  Multiple Vitamins-Minerals (PRESERVISION AREDS 2+MULTI VIT PO) Take 1 capsule by mouth 2 (two) times daily.    [provider]  phenazopyridine (PYRIDIUM) 200 MG tablet Take 1 tablet (200 mg total) by mouth 3 (three) times daily as needed for pain. Patient not taking: Reported on 03/18/2019 07/23/18   Ardis Hughs, MD  risperiDONE (RISPERDAL) 0.5 MG tablet Take 0.5 mg by mouth every evening.     [provider]  tamsulosin (FLOMAX) 0.4 MG CAPS capsule Take 0.4 mg by mouth daily after supper.    [provider]  traZODone (DESYREL) 50 MG tablet Take 25 mg by mouth at bedtime.     [provider]    Physical Exam: Vitals:   06/30/19 2145 06/30/19 2200 06/30/19 2215 06/30/19 2230  BP: 138/69 117/64 127/70 128/61  Pulse: 99 99 99 (!) 102  Resp: (!) 29 17 (!) 23 (!) 30  Temp:      TempSrc:      SpO2: 100% 99% 100% 100%  Weight:      Height:        Constitutional: NAD, calm, comfortable, non-toxic pleasantly demented male laying flat in bed Vitals:   06/30/19 2145 06/30/19 2200 06/30/19 2215 06/30/19 2230  BP: 138/69 117/64 127/70 128/61  Pulse: 99 99 99 (!) 102  Resp: (!) 29 17 (!) 23 (!) 30  Temp:      TempSrc:      SpO2: 100% 99% 100% 100%  Weight:      Height:       Eyes: PERRL, lids and conjunctivae normal ENMT: Mucous membranes are moist with dry blood around the lips. Posterior pharynx clear of any exudate or lesions. Neck: normal, supple, no masses, no thyromegaly Respiratory: clear to auscultation bilaterally, no wheezing, no crackles. Normal respiratory effort. No accessory muscle use.  Cardiovascular: Regular rate and rhythm, no murmurs / rubs / gallops. +2 pitting extremity edema. 2+ pedal pulses.  Abdomen: pain with palpation of the right lower abdomen adjacent the umbilicus area, no masses palpated.  Bowel sounds positive.  Musculoskeletal: no clubbing / cyanosis. No joint deformity upper and lower extremities. no contractures. Normal muscle tone.  Skin: no rashes, lesions, ulcers. No induration Neurologic: CN 2-12 grossly intact. Sensation  intact, DTR normal. Strength 4/5 of the lower extremity. Psychiatric: Normal judgment and insight. Alert and oriented x 3. Normal mood.     Labs on Admission: I have personally reviewed following labs and imaging studies  CBC: Recent Labs  Lab 06/30/19 1935  WBC 18.6*  NEUTROABS 14.2*  HGB 12.0*  HCT 38.5*  MCV 96.3  PLT 123XX123   Basic Metabolic Panel: Recent Labs  Lab 06/30/19 1935  NA 138  K 3.6  CL 111  CO2 15*  GLUCOSE 166*  BUN 29*  CREATININE 1.28*  CALCIUM 8.4*   GFR: Estimated Creatinine Clearance: 42.1 mL/min (A) (by C-G formula based on SCr of 1.28 mg/dL (H)). Liver Function Tests: Recent Labs  Lab 06/30/19 1935  AST 43*  ALT 24  ALKPHOS  135*  BILITOT 0.3  PROT 6.1*  ALBUMIN 2.7*   No results for input(s): LIPASE, AMYLASE in the last 168 hours. No results for input(s): AMMONIA in the last 168 hours. Coagulation Profile: Recent Labs  Lab 06/30/19 1950  INR 1.2   Cardiac Enzymes: No results for input(s): CKTOTAL, CKMB, CKMBINDEX, TROPONINI in the last 168 hours. BNP (last 3 results) No results for input(s): PROBNP in the last 8760 hours. HbA1C: No results for input(s): HGBA1C in the last 72 hours. CBG: No results for input(s): GLUCAP in the last 168 hours. Lipid Profile: No results for input(s): CHOL, HDL, LDLCALC, TRIG, CHOLHDL, LDLDIRECT in the last 72 hours. Thyroid Function Tests: No results for input(s): TSH, T4TOTAL, FREET4, T3FREE, THYROIDAB in the last 72 hours. Anemia Panel: No results for input(s): VITAMINB12, FOLATE, FERRITIN, TIBC, IRON, RETICCTPCT in the last 72 hours. Urine analysis:    Component Value Date/Time   COLORURINE YELLOW 12/22/2017 1616   APPEARANCEUR CLEAR 12/22/2017 1616   LABSPEC 1.029 12/22/2017 1616   PHURINE 6.0 12/22/2017 1616   GLUCOSEU NEGATIVE 12/22/2017 1616   HGBUR NEGATIVE 12/22/2017 1616   BILIRUBINUR NEGATIVE 12/22/2017 1616   KETONESUR NEGATIVE 12/22/2017 1616   PROTEINUR NEGATIVE 12/22/2017  1616   NITRITE NEGATIVE 12/22/2017 1616   LEUKOCYTESUR NEGATIVE 12/22/2017 1616    Radiological Exams on Admission: Ct Abdomen Pelvis W Contrast  Result Date: 06/30/2019 CLINICAL DATA:  Hematemesis. Generalized weakness and diarrhea. EXAM: CT ABDOMEN AND PELVIS WITH CONTRAST TECHNIQUE: Multidetector CT imaging of the abdomen and pelvis was performed using the standard protocol following bolus administration of intravenous contrast. CONTRAST:  165mL OMNIPAQUE IOHEXOL 300 MG/ML  SOLN COMPARISON:  Abdominal radiographs dated 06/30/2019 FINDINGS: Lower chest: Elevation of the left hemidiaphragm. Bronchiectasis at the lung bases with slight accentuation of the interstitial markings and slight tree in bud densities at the right lung base. Slight atelectasis at the left lung base with pleural calcification at the left lung base posteriorly. No discrete effusion. Hepatobiliary: Liver parenchyma is normal. Dependent subtle densities in the gallbladder probably represent tiny stones. The gallbladder is not distended. No gallbladder wall thickening. Biliary tree is normal. Pancreas: Unremarkable. No pancreatic ductal dilatation or surrounding inflammatory changes. Spleen: Normal in size without focal abnormality. Adrenals/Urinary Tract: Adrenal glands are normal. No significant abnormality of the kidneys. No hydronephrosis. Bladder is normal. Stomach/Bowel: The stomach is distended with fluid. There is a slightly higher density material in the stomach which may represent ingested food or a bezoar or clot. The duodenum and small bowel are normal. Appendix is normal. Large amount of stool in the rectum. Moderate stool in the transverse and descending portions of the colon. Vascular/Lymphatic: Aortic atherosclerosis. No enlarged abdominal or pelvic lymph nodes. Reproductive: There is a focal area of enhancement at the base of the bladder to the left of midline measuring 12 x 20 x 7 mm. The patient has a history of  resection of a bladder tumor as well as resection of the prostate gland. Other: No abdominal wall hernia or abnormality. No abdominopelvic ascites. Musculoskeletal: No acute or significant osseous findings. IMPRESSION: 1. Distended stomach filled with fluid and high density material which could represent a bezoar or clot. 2. Focal area of enhancement at the base of the bladder to the left of midline consistent with the patient's history of bladder tumor. This could represent scarring or tumor. 3. Large amount of stool in the rectum. 4. Probable tiny gallstones. 5. Elevation of the left hemidiaphragm. 6. Bronchiectasis at the  lung bases with slight accentuation of the interstitial markings and tree in bud densities at the right lung base. 7. Aortic Atherosclerosis (ICD10-I70.0).a Electronically Signed   By: Lorriane Shire M.D.   On: 06/30/2019 21:52   Dg Abdomen Acute W/chest  Result Date: 06/30/2019 CLINICAL DATA:  GI bleed.  Concern for perforation. EXAM: DG ABDOMEN ACUTE W/ 1V CHEST COMPARISON:  03/18/2019 chest radiograph. FINDINGS: Stable cardiomediastinal silhouette with top-normal heart size. No pneumothorax. No pleural effusion. No pulmonary edema. Low lung volumes. Mild bibasilar scarring versus atelectasis. Questionable lucency under the left hemidiaphragm on the erect portable chest radiograph. No evidence of pneumatosis or pneumoperitoneum on the left lateral decubitus views. Relatively gasless abdomen with no dilated small bowel loops. Fluid levels scattered in the large bowel. No radiopaque nephrolithiasis. Degenerative changes in the lumbar spine. IMPRESSION: 1. Questionable lucency under the left hemidiaphragm on the upright portable chest radiograph, although with no evidence of pneumatosis or pneumoperitoneum on the left lateral decubitus views. If there is continued clinical concern for free air, dedicated CT of the abdomen and pelvis with oral and IV contrast would be recommended for further  evaluation. 2. Low lung volumes with mild bibasilar scarring versus atelectasis. 3. Relatively gasless abdomen with no specific findings of bowel obstruction. Scattered fluid levels in the large bowel. These results were called by telephone at the time of interpretation on 06/30/2019 at 8:59 pm to provider PA [facility] , who verbally acknowledged these results. Electronically Signed   By: Ilona Sorrel M.D.   On: 06/30/2019 20:53      Assessment/Plan Upper GI bleed -Hemoglobin is stable at 12 point -Will trend CBC every 8 hours -Continue Protonix drip -will keep n.p.o. -GI consulted by EDP will reevaluate in the morning  Hypotension -Patient presented with systolic of [id number] over [id number].  Likely secondary to dehydration from upper GI bleed and diarrhea. -Blood pressure now normotensive s/p 3 L of normal saline bolus -will continue IV fluids  AKI - creatinine of 1.28 on admission. Suspect from dehydration. - check BMP after fluids - avoid nephrotoxic agents  Constipation -Stool burden noted in rectum on CT abdomen -Bisacodyl suppository   Leukocytosis -Patient is afebrile but tachypnea and has coughing. Also uptrending lactate- concern for possible aspiration pneumonia.  - obtain CXR - start empiric coverage with Unasyn and Vancomycin   History of TIA - hold aspirin  Hypertension - hold metoprolol  Dementia - continue memantine, risperidone, trazodone  Hyperlipidemia - not on statin   DVT prophylaxis:SCD Code Status:DNR Family Communication: Plan discussed with patient and son at bedside. All questions answered disposition Plan: Home with at least 2 midnight stays  Consults called: GI Admission status: inpatient   Kitzia Camus T Renika Shiflet DO [facility]   If 7PM-7AM, please contact night-coverage [email or web address] Password [email or web address]  06/30/2019, 11:22 PM

## 2019-06-30 NOTE — ED Triage Notes (Signed)
Pt presents to ED from [facility]. Facility called EMS for diarrhea, weakness. Upon EMS arrival pt had one episode of bright red bloody emesis. Pt had near syncopal episode. EMS VS hypotensive 80/50. Pt very pale on arrival.

## 2019-07-01 ENCOUNTER — Inpatient Hospital Stay (HOSPITAL_COMMUNITY): Payer: Medicare Other

## 2019-07-01 DIAGNOSIS — K920 Hematemesis: Principal | ICD-10-CM

## 2019-07-01 DIAGNOSIS — R933 Abnormal findings on diagnostic imaging of other parts of digestive tract: Secondary | ICD-10-CM

## 2019-07-01 LAB — CBC
HCT: 34.8 % — ABNORMAL LOW (ref 39.0–52.0)
HCT: 34 % — ABNORMAL LOW (ref 39.0–52.0)
Hemoglobin: 11 g/dL — ABNORMAL LOW (ref 13.0–17.0)
Hemoglobin: 11 g/dL — ABNORMAL LOW (ref 13.0–17.0)
MCH: 29.6 pg (ref 26.0–34.0)
MCH: 29.9 pg (ref 26.0–34.0)
MCHC: 31.6 g/dL (ref 30.0–36.0)
MCHC: 32.4 g/dL (ref 30.0–36.0)
MCV: 93.5 fL (ref 80.0–100.0)
MCV: 92.4 fL (ref 80.0–100.0)
Platelets: 332 10*3/uL (ref 150–400)
Platelets: 282 10*3/uL (ref 150–400)
RBC: 3.72 MIL/uL — ABNORMAL LOW (ref 4.22–5.81)
RBC: 3.68 MIL/uL — ABNORMAL LOW (ref 4.22–5.81)
RDW: 15.8 % — ABNORMAL HIGH (ref 11.5–15.5)
RDW: 15.9 % — ABNORMAL HIGH (ref 11.5–15.5)
WBC: 22.8 10*3/uL — ABNORMAL HIGH (ref 4.0–10.5)
WBC: 27.9 10*3/uL — ABNORMAL HIGH (ref 4.0–10.5)
nRBC: 0 % (ref 0.0–0.2)
nRBC: 0 % (ref 0.0–0.2)

## 2019-07-01 LAB — URINALYSIS, ROUTINE W REFLEX MICROSCOPIC
Bilirubin Urine: NEGATIVE
Glucose, UA: NEGATIVE mg/dL
Hgb urine dipstick: NEGATIVE
Ketones, ur: NEGATIVE mg/dL
Leukocytes,Ua: NEGATIVE
Nitrite: NEGATIVE
Protein, ur: NEGATIVE mg/dL
Specific Gravity, Urine: 1.031 — ABNORMAL HIGH (ref 1.005–1.030)
pH: 5 (ref 5.0–8.0)

## 2019-07-01 LAB — BASIC METABOLIC PANEL
Anion gap: 14 (ref 5–15)
BUN: 37 mg/dL — ABNORMAL HIGH (ref 8–23)
CO2: 17 mmol/L — ABNORMAL LOW (ref 22–32)
Calcium: 8.4 mg/dL — ABNORMAL LOW (ref 8.9–10.3)
Chloride: 109 mmol/L (ref 98–111)
Creatinine, Ser: 1.48 mg/dL — ABNORMAL HIGH (ref 0.61–1.24)
GFR calc Af Amer: 48 mL/min — ABNORMAL LOW (ref >60)
GFR calc non Af Amer: 41 mL/min — ABNORMAL LOW (ref >60)
Glucose, Bld: 150 mg/dL — ABNORMAL HIGH (ref 70–99)
Potassium: 4 mmol/L (ref 3.5–5.1)
Sodium: 140 mmol/L (ref 135–145)

## 2019-07-01 LAB — LACTIC ACID, PLASMA
Lactic Acid, Venous: 4 mmol/L (ref 0.5–1.9)
Lactic Acid, Venous: 5 mmol/L (ref 0.5–1.9)

## 2019-07-01 LAB — MRSA PCR SCREENING: MRSA by PCR: NEGATIVE

## 2019-07-01 LAB — SARS CORONAVIRUS 2 (TAT 6-24 HRS): SARS Coronavirus 2: NEGATIVE

## 2019-07-01 MED ORDER — RISPERIDONE 0.5 MG PO TABS
0.2500 mg | ORAL_TABLET | Freq: Every evening | ORAL | Status: DC
  Administered 2019-07-03 – 2019-07-06 (×4): 0.25 mg via ORAL
  Filled 2019-07-01 (×5): qty 1

## 2019-07-01 MED ORDER — ONDANSETRON HCL 4 MG/2ML IJ SOLN: 4.0000 mg | Freq: Four times a day (QID) | INTRAMUSCULAR | Status: DC | PRN

## 2019-07-01 MED ORDER — SODIUM CHLORIDE 0.9 % IV SOLN
3.0000 g | Freq: Three times a day (TID) | INTRAVENOUS | Status: DC
  Administered 2019-07-01 – 2019-07-05 (×12): 3 g via INTRAVENOUS
  Filled 2019-07-01: qty 3
  Filled 2019-07-01 (×4): qty 8
  Filled 2019-07-01: qty 3
  Filled 2019-07-01 (×3): qty 8
  Filled 2019-07-01: qty 3
  Filled 2019-07-01: qty 8
  Filled 2019-07-01 (×2): qty 3
  Filled 2019-07-01: qty 8
  Filled 2019-07-01: qty 3
  Filled 2019-07-01: qty 8

## 2019-07-01 MED ORDER — TRAZODONE HCL 50 MG PO TABS
25.0000 mg | ORAL_TABLET | Freq: Every day | ORAL | Status: DC
  Administered 2019-07-01 – 2019-07-06 (×7): 25 mg via ORAL
  Filled 2019-07-01 (×7): qty 1

## 2019-07-01 MED ORDER — LORAZEPAM 2 MG/ML IJ SOLN
0.5000 mg | INTRAMUSCULAR | Status: DC | PRN
  Administered 2019-07-01 – 2019-07-06 (×5): 0.5 mg via INTRAVENOUS
  Filled 2019-07-01 (×5): qty 1

## 2019-07-01 MED ORDER — MEMANTINE HCL 10 MG PO TABS
10.0000 mg | ORAL_TABLET | Freq: Every day | ORAL | Status: DC
  Administered 2019-07-01 – 2019-07-06 (×7): 10 mg via ORAL
  Filled 2019-07-01 (×8): qty 1

## 2019-07-01 MED ORDER — PANTOPRAZOLE SODIUM 40 MG IV SOLR
40.0000 mg | Freq: Two times a day (BID) | INTRAVENOUS | Status: DC
  Administered 2019-07-01 – 2019-07-02 (×3): 40 mg via INTRAVENOUS
  Filled 2019-07-01 (×3): qty 40

## 2019-07-01 MED ORDER — METOPROLOL TARTRATE 5 MG/5ML IV SOLN
5.0000 mg | Freq: Four times a day (QID) | INTRAVENOUS | Status: DC
  Administered 2019-07-01 – 2019-07-03 (×6): 5 mg via INTRAVENOUS
  Filled 2019-07-01 (×8): qty 5

## 2019-07-01 MED ORDER — TAMSULOSIN HCL 0.4 MG PO CAPS
0.4000 mg | ORAL_CAPSULE | Freq: Every day | ORAL | Status: DC
  Administered 2019-07-04 – 2019-07-06 (×3): 0.4 mg via ORAL
  Filled 2019-07-01 (×4): qty 1

## 2019-07-01 MED ORDER — VANCOMYCIN HCL 10 G IV SOLR: 1500.0000 mg | INTRAVENOUS | Status: DC

## 2019-07-01 MED ORDER — VANCOMYCIN HCL 10 G IV SOLR
1500.0000 mg | Freq: Once | INTRAVENOUS | Status: AC
  Administered 2019-07-01: 1500 mg via INTRAVENOUS
  Filled 2019-07-01: qty 1500

## 2019-07-01 NOTE — Consult Note (Addendum)
[facility] Consult: 3:43 PM 07/01/2019  LOS: 1 day    Referring Provider: Dr Verlon Au  Primary Care Physician:  Reymundo Poll, MD Primary Gastroenterologist:  unassigned    Reason for Consultation: Hematemesis reported   HPI: Billy [name] is a 85 y.o. male.  Assisted living resident at [facility].  PMH CVA.  Overactive bladder.  Pituitary mass.  Hypertension.  Hearing loss.  Mild dementia.  Hypercholesterolemia.  Noninvasive, high-grade papillary urothelial carcinoma dx 07/2018.  TURP, cystoscopies Last colonoscopy was probably greater than 10 years ago and it was done either in [place] or in the state of [place].  Last night at his assisted living facility he developed acute onset vomiting with hematemesis reported.  Has not had any further emesis since arrival in the ED last night.  Stools are FOBT negative.  His son tells me the patient very rarely has any problems with his stomach and definitely is not prone to nausea.  Since yesterday he has been complaining of diffuse abdominal as well as bladder pain and is tender on abdominal palpation meds include 81 mg aspirin, no PPI or H2 blockers. Acute abdominal series showed ?  Lucency under left hemidiaphragm, no pneumatosis.  Gasless abdomen, no bowel obstruction.  Scattered fluid levels in the colon.  Urinalysis is unremarkable.  Hgb stable at 11.  NR 1.2. His white blood cell count has gone from 18.6 on 1118, 22.8 and then 27.9 on 11/19.  Seething Unasyn, Protonix drip    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[place])    Stroke [facility])    TIA    Past Surgical History:  Procedure Laterality Date   CYSTOSCOPY W/  RETROGRADES Bilateral 07/23/2018   Procedure: CYSTOSCOPY WITH RETROGRADE PYELOGRAM;  Surgeon: Ardis Hughs, MD;  Location: [facility];  Service: Urology;  Laterality: Bilateral;   EYE SURGERY     bilateral cataract surgery with lens implants   TRANSURETHRAL RESECTION OF BLADDER TUMOR WITH MITOMYCIN-C N/A 07/23/2018   Procedure: TRANSURETHRAL RESECTION OF BLADDER TUMOR WITH GEMCITABINE;  Surgeon: Ardis Hughs, MD;  Location: [facility];  Service: Urology;  Laterality: N/A;   TRANSURETHRAL RESECTION OF PROSTATE     x 2    Prior to Admission medications   Medication Sig Start Date End Date Taking? Authorizing Provider  acetaminophen (TYLENOL) 650 MG CR tablet Take 650 mg by mouth daily.   Yes [provider]  aspirin EC 81 MG EC tablet Take 1 tablet (81 mg total) by mouth daily. 12/24/17  Yes Geradine Girt, DO  guaiFENesin (MUCINEX) 600 MG 12 hr tablet Take 600 mg by mouth 2 (two) times daily as needed for cough (and congestion).   Yes [provider]  memantine (NAMENDA) 10 MG tablet Take 10 mg by mouth at bedtime.   Yes [provider]  metoprolol tartrate (LOPRESSOR) 25 MG tablet Take 12.5 mg by mouth  daily.   Yes [provider]  Multiple Vitamins-Minerals (CENTRUM SILVER 50+MEN PO) Take 1 tablet by mouth daily.   Yes [provider]  Multiple Vitamins-Minerals (PRESERVISION AREDS 2+MULTI VIT PO) Take 1 capsule by mouth 2 (two) times daily.   Yes [provider]  risperiDONE (RISPERDAL) 0.25 MG tablet Take 0.25 mg by mouth every evening. Take 0.25 mg by mouth daily at 4:30 PM and an additional 0.125 mg once daily as needed for anxiety, hallucinations, or agitation   Yes [provider]  tamsulosin (FLOMAX) 0.4 MG CAPS capsule Take 0.4 mg by mouth daily after supper.   Yes [provider]  traZODone (DESYREL) 50 MG tablet Take 25 mg by mouth at bedtime.    Yes [provider]  phenazopyridine (PYRIDIUM)  200 MG tablet Take 1 tablet (200 mg total) by mouth 3 (three) times daily as needed for pain. Patient not taking: Reported on 06/30/2019 07/23/18   Ardis Hughs, MD    Scheduled Meds:  bisacodyl  10 mg Rectal Once   memantine  10 mg Oral QHS   metoprolol tartrate  5 mg Intravenous Q6H   risperiDONE  0.25 mg Oral QPM   tamsulosin  0.4 mg Oral QPC supper   traZODone  25 mg Oral QHS   Infusions:  sodium chloride 75 mL/hr at 07/01/19 0139   ampicillin-sulbactam (UNASYN) IV 3 g (07/01/19 1300)   pantoprozole (PROTONIX) infusion 8 mg/hr (07/01/19 0035)   PRN Meds: LORazepam, ondansetron (ZOFRAN) IV   Allergies as of 06/30/2019   (No Known Allergies)    Family History  Problem Relation Age of Onset   Hypertension Mother    Hypertension Father     Social History   Socioeconomic History   Marital status: Widowed    Spouse name: Not on file   Number of children: Not on file   Years of education: Not on file   Highest education level: Not on file  Occupational History   Not on file  Social Needs   Financial resource strain: Not on file   Food insecurity    Worry: Not on file    Inability: Not on file   Transportation needs    Medical: Not on file    Non-medical: Not on file  Tobacco Use   Smoking status: Former Smoker    Types: Pipe   Smokeless tobacco: Never Used  Substance and Sexual Activity   Alcohol use: Not Currently   Drug use: Not Currently   Sexual activity: Not on file  Lifestyle   Physical activity    Days per week: Not on file    Minutes per session: Not on file   Stress: Not on file  Relationships   Social connections    Talks on phone: Not on file    Gets together: Not on file    Attends religious service: Not on file    Active member of club or organization: Not on file    Attends meetings of clubs or organizations: Not on file    Relationship status: Not on file   Intimate partner violence    Fear of  current or ex partner: Not on file    Emotionally abused: Not on file    Physically abused: Not on file    Forced sexual activity: Not on file  Other Topics Concern   Not on file  Social History Narrative   Not on file    REVIEW OF SYSTEMS: Constitutional: Son reports  general decline in the last 6 months.  He is less ambulatory and more dependent on his walker.  He used to have falls but has not had any falls in 6 months or more. ENT:  No nose bleeds Pulm: No shortness of breath or cough. CV:  No palpitations, no LE edema.  GU:  No hematuria, no frequency GI: generally good appetite.  No dysphagia.  No heartburn. Heme: No reports of unusual or excessive bleeding. Transfusions: His son does not recall patient ever requiring blood transfusion. Neuro:  No headaches, no peripheral tingling or numbness.  Syncope.  No seizures.  Slept a lot today. Derm:  No itching, no rash or sores.  Endocrine:  No sweats or chills.  No polyuria or dysuria Immunization: Up to date on his flu shot. Travel:  None beyond local counties in last few months.    PHYSICAL EXAM: Vital signs in last 24 hours: Vitals:   07/01/19 1256 07/01/19 1327  BP: 107/64   Pulse: (!) 101 79  Resp: (!) 31   Temp: (!) 97.4 F (36.3 C)   SpO2: 100%    Wt Readings from Last 3 Encounters:  06/30/19 86.2 kg  04/16/19 63.5 kg  07/21/18 91 kg    General: Pale, aged, frail looking WM Head: No facial asymmetry or swelling.  No signs of head trauma. Eyes: Conjunctiva pink, no scleral icterus. Ears: Hard of hearing Nose: Congestion or discharge Mouth: Oral mucosa pink, moist, clear. Neck: No JVD, no masses, no thyromegaly. Lungs: Managed breath sounds but clear bilaterally.  There is a wet quality cough on occasion Heart: RRR.  No MRG.  S1, S2 present. Abdomen: Tender throughout the abdomen but no guarding or rebound.  No masses, HSM, bruits, hernias..   Rectal: Did not perform Musc/Skeltl: No joint redness or  swelling.  Some kyphosis. Extremities: Nonpitting, minor lower extremity edema Neurologic: Hard of hearing.  Not oriented to year, place but knows who he is and recognizes his son.  All 4 limbs.  No tremors Skin: No abrasions, rashes, sores. Tattoos: None observed Nodes: No cervical adenopathy Psych: Calm, sleepy, cooperative, follows commands.  Intake/Output from previous day: 11/18 0701 - 11/19 0700 In: -  Out: 200 [Urine:200] Intake/Output this shift: No intake/output data recorded.  LAB RESULTS: Recent Labs    06/30/19 1935 07/01/19 0327 07/01/19 0721  WBC 18.6* 22.8* 27.9*  HGB 12.0* 11.0* 11.0*  HCT 38.5* 34.8* 34.0*  PLT 297 332 282   BMET Lab Results  Component Value Date   NA 140 07/01/2019   NA 138 06/30/2019   NA 136 04/16/2019   K 4.0 07/01/2019   K 3.6 06/30/2019   K 3.8 04/16/2019   CL 109 07/01/2019   CL 111 06/30/2019   CL 104 04/16/2019   CO2 17 (L) 07/01/2019   CO2 15 (L) 06/30/2019   CO2 21 (L) 04/16/2019   GLUCOSE 150 (H) 07/01/2019   GLUCOSE 166 (H) 06/30/2019   GLUCOSE 97 04/16/2019   BUN 37 (H) 07/01/2019   BUN 29 (H) 06/30/2019   BUN 19 04/16/2019   CREATININE 1.48 (H) 07/01/2019   CREATININE 1.28 (H) 06/30/2019   CREATININE 0.83 04/16/2019   CALCIUM 8.4 (L) 07/01/2019   CALCIUM 8.4 (L) 06/30/2019   CALCIUM 9.0 04/16/2019   LFT Recent Labs    06/30/19 1935  PROT 6.1*  ALBUMIN 2.7*  AST 43*  ALT 24  ALKPHOS 135*  BILITOT 0.3   PT/INR Lab Results  Component Value Date  INR 1.2 06/30/2019   INR 1.00 12/22/2017   Hepatitis Panel No results for input(s): HEPBSAG, HCVAB, HEPAIGM, HEPBIGM in the last 72 hours. C-Diff No components found for: CDIFF Lipase  No results found for: LIPASE  Drugs of Abuse     Component Value Date/Time   LABOPIA NONE DETECTED 12/22/2017 1616   COCAINSCRNUR NONE DETECTED 12/22/2017 1616   LABBENZ NONE DETECTED 12/22/2017 1616   AMPHETMU NONE DETECTED 12/22/2017 1616   THCU NONE DETECTED  12/22/2017 1616   LABBARB NONE DETECTED 12/22/2017 1616     RADIOLOGY STUDIES: Ct Abdomen Pelvis W Contrast  Result Date: 06/30/2019 CLINICAL DATA:  Hematemesis. Generalized weakness and diarrhea. EXAM: CT ABDOMEN AND PELVIS WITH CONTRAST TECHNIQUE: Multidetector CT imaging of the abdomen and pelvis was performed using the standard protocol following bolus administration of intravenous contrast. CONTRAST:  132mL OMNIPAQUE IOHEXOL 300 MG/ML  SOLN COMPARISON:  Abdominal radiographs dated 06/30/2019 FINDINGS: Lower chest: Elevation of the left hemidiaphragm. Bronchiectasis at the lung bases with slight accentuation of the interstitial markings and slight tree in bud densities at the right lung base. Slight atelectasis at the left lung base with pleural calcification at the left lung base posteriorly. No discrete effusion. Hepatobiliary: Liver parenchyma is normal. Dependent subtle densities in the gallbladder probably represent tiny stones. The gallbladder is not distended. No gallbladder wall thickening. Biliary tree is normal. Pancreas: Unremarkable. No pancreatic ductal dilatation or surrounding inflammatory changes. Spleen: Normal in size without focal abnormality. Adrenals/Urinary Tract: Adrenal glands are normal. No significant abnormality of the kidneys. No hydronephrosis. Bladder is normal. Stomach/Bowel: The stomach is distended with fluid. There is a slightly higher density material in the stomach which may represent ingested food or a bezoar or clot. The duodenum and small bowel are normal. Appendix is normal. Large amount of stool in the rectum. Moderate stool in the transverse and descending portions of the colon. Vascular/Lymphatic: Aortic atherosclerosis. No enlarged abdominal or pelvic lymph nodes. Reproductive: There is a focal area of enhancement at the base of the bladder to the left of midline measuring 12 x 20 x 7 mm. The patient has a history of resection of a bladder tumor as well as  resection of the prostate gland. Other: No abdominal wall hernia or abnormality. No abdominopelvic ascites. Musculoskeletal: No acute or significant osseous findings. IMPRESSION: 1. Distended stomach filled with fluid and high density material which could represent a bezoar or clot. 2. Focal area of enhancement at the base of the bladder to the left of midline consistent with the patient's history of bladder tumor. This could represent scarring or tumor. 3. Large amount of stool in the rectum. 4. Probable tiny gallstones. 5. Elevation of the left hemidiaphragm. 6. Bronchiectasis at the lung bases with slight accentuation of the interstitial markings and tree in bud densities at the right lung base. 7. Aortic Atherosclerosis (ICD10-I70.0).a Electronically Signed   By: Lorriane Shire M.D.   On: 06/30/2019 21:52   Dg Abdomen Acute W/chest  Result Date: 06/30/2019 CLINICAL DATA:  GI bleed.  Concern for perforation. EXAM: DG ABDOMEN ACUTE W/ 1V CHEST COMPARISON:  03/18/2019 chest radiograph. FINDINGS: Stable cardiomediastinal silhouette with top-normal heart size. No pneumothorax. No pleural effusion. No pulmonary edema. Low lung volumes. Mild bibasilar scarring versus atelectasis. Questionable lucency under the left hemidiaphragm on the erect portable chest radiograph. No evidence of pneumatosis or pneumoperitoneum on the left lateral decubitus views. Relatively gasless abdomen with no dilated small bowel loops. Fluid levels scattered in the large bowel.  No radiopaque nephrolithiasis. Degenerative changes in the lumbar spine. IMPRESSION: 1. Questionable lucency under the left hemidiaphragm on the upright portable chest radiograph, although with no evidence of pneumatosis or pneumoperitoneum on the left lateral decubitus views. If there is continued clinical concern for free air, dedicated CT of the abdomen and pelvis with oral and IV contrast would be recommended for further evaluation. 2. Low lung volumes with  mild bibasilar scarring versus atelectasis. 3. Relatively gasless abdomen with no specific findings of bowel obstruction. Scattered fluid levels in the large bowel. These results were called by telephone at the time of interpretation on 06/30/2019 at 8:59 pm to provider PA [facility] , who verbally acknowledged these results. Electronically Signed   By: Ilona Sorrel M.D.   On: 06/30/2019 20:53       IMPRESSION:   *   Nausea, vomiting with hematemesis reported at assisted living last night FOBT negative. Vomiting has not recurred.  Patient denies nausea.  Still having abdominal pain.  *    Anemia, mild, stable Hb 11.  MCV normal.  *      COVID-19 negative.  *     Leukocytosis.  Acute abdominal series does not demonstrate pneumonia but suspect aspiration pneumonia based on his wet vocal quality and the vomiting yesterday evening.  PLAN:     *     EGD eventually but hold off for now.  Prefer to allow his asp PNA to improve and with stable Hgb and no further n/v, stable enough to delay.   Consent probably needs to be obtained from family.  His son is Kylenn Dozier phone [phone].  I spoke to him about the situation, he was in the room with his dad.    *    Ice chips and sips water.    *   Switch off Protonix drip onto twice daily IV Protonix. Repeat the CBC in the morning.   Azucena Freed  07/01/2019, 3:43 PM Phone [phone]  I have reviewed the entire case in detail with the above APP and discussed the plan in detail.  Therefore, I agree with the diagnoses recorded above. In addition,  I have personally interviewed and examined the patient and have personally reviewed any abdominal/pelvic CT scan images.  My additional thoughts are as follows:  There was reported hematemesis at this patient's nursing facility, none has been witnessed since admission, nor has he passed melena or bright red blood per rectum.  His stool was actually heme-negative.  Initial hemoglobin of 12.7  dropped to 11 with volume administration.  He is not currently having overt GI bleeding, and I do not think his low blood pressure at the time of admission can be attributed to hypovolemic shock from GI bleeding.  He has a rising white blood cell count, and his initial chest x-ray is a very poor quality.  Urinalysis  unremarkable.  I suspect sepsis related to underlying pneumonia that is likely to blossom on repeat chest x-ray after volume administration.  In addition, the CT scan shows the stomach is markedly dilated with a large amount of fluid and probably food debris in it.  It is hard to say whether this is acute or chronic but might account for some epigastric tenderness he seems to have as evidenced by wincing on exam.  He cannot provide any history or review of systems, and is quite febrile with altered mental status.  I have not yet elected to pursue an upper endoscopy  on this patient.  He seems to have more pressing concerns, and it would be a high risk procedure given his age and current condition.  I recommend keeping his head of bed elevated to least 30 degrees, nothing by mouth for now until we see which way his clinical picture is heading in the next couple of days, acid suppression as previously ordered and we will follow with you.  Nelida Meuse III Office:[phone]

## 2019-07-01 NOTE — Progress Notes (Signed)
Pharmacy Antibiotic Note  Billy [name] is a 85 y.o. male admitted on 06/30/2019 with leukocytosis and cough, possible PNA.  Pharmacy has been consulted for Vancomycin and Unasyn dosing.  Plan: Vancomycin 1500 mg IV q48h Unasyn 3 g IV q8h  Height: 6' (182.9 cm) Weight: 190 lb (86.2 kg) IBW/kg (Calculated) : 77.6  Temp (24hrs), Avg:97.5 F (36.4 C), Min:97.4 F (36.3 C), Max:97.5 F (36.4 C)  Recent Labs  Lab 06/30/19 0102 06/30/19 1935 06/30/19 2051  WBC  --  18.6*  --   CREATININE  --  1.28*  --   LATICACIDVEN 4.0*  --  3.8*    Estimated Creatinine Clearance: 42.1 mL/min (A) (by C-G formula based on SCr of 1.28 mg/dL (H)).    No Known Allergies  Billy [name] 07/01/2019 2:08 AM

## 2019-07-01 NOTE — Progress Notes (Signed)
Hospitalist progress note  If 7PM-7AM,  night-coverage-look on AMION -prefer pages-not epic chat,please  Billy [name] [id number] DOB: 1926/10/31 DOA: 06/30/2019  PCP: Reymundo Poll, MD   Narrative:  89[facility]sk[facility] nursing facility year-old white male HTN, overactive bladder, TIA 12/2017, HLD, possible dementia, bladder cancer prostate cancer and obstructive uropathy status post surgery  Admit 06/30/2019 hematemesis BRB-initially hypotensive 80s over 50s after 3 L fluid bolus WBC 18-->22 hemoglobin 12 and stable BUN/creatinine 29/1.2 >baseline 0.8 CT abdomen pelvis?  Vasa were clot and large stool burden Started on Protonix GTT  Assessment & Plan: Hypovolemic shock 2/2 upper GI bleed 3 L IV fluid given, continue saline 125 cc/h Hemoglobin has remained pretty stable over the past 4 hours Monitor trends every 12 and transfuse for hemoglobin less than 7-GI input pending His sinus tachycardia is likely secondary to withdrawal of his beta-blocker and have placed him on low-dose metoprolol AKI Basic metabolic panel is pending-lactic acidosis is probably secondary to poor endorgan perfusion Leukocytosis?  Pneumonia Possibility of aspiration therefore covering with Unasyn monotherapy-repeat CXR in a.m. and monitor trends Prior TIA For now holding aspirin until GI can see with regards to scope HTN Hypertension shock on admission, placing on low-dose metoprolol IV every 6 hourly with hold parameters for blood pressures HLD Holding statin at this time Bladder/prostate cancer status post TURP  SCD, called son Sundra Aland [id number] no response, DNR, telemetry  Subjective: Completely confused cannot give history thinks that he is in [place] and cannot orient History obtained from chart in addition to circumstantial history from nursing staff Consultants:   GI pending Procedures:   No Antimicrobials:   Narrowed to Unasyn  Data Reviewed: I have personally reviewed following labs and imaging  studies  White count up from 18.6-22 hemoglobin down from 12-11.0 platelets stable Lactic acid cycled from 4-3 0.8-5.0 no repeat c-Met as yet  Objective: Vitals:   07/01/19 0531 07/01/19 0600 07/01/19 0656 07/01/19 0700  BP:  124/88  117/74  Pulse:  (!) 110 (!) 110 (!) 109  Resp:  (!) 49 (!) 38 (!) 33  Temp: 98 F (36.7 C)     TempSrc: Rectal     SpO2:  96% 97% 97%  Weight:      Height:        Intake/Output Summary (Last 24 hours) at 07/01/2019 0717 Last data filed at 07/01/2019 0053 Gross per 24 hour  Intake -  Output 200 ml  Net -200 ml   Filed Weights   06/30/19 2006  Weight: 86.2 kg    Examination: Awake incoherent but alert no distress Seems quite anxious S1-S2 tachycardic cannot really appreciate murmur Chest clinically clear no added sound no rales Abdomen soft no rebound no guarding No lower extremity edema   Scheduled Meds: . bisacodyl  10 mg Rectal Once  . memantine  10 mg Oral QHS  . risperiDONE  0.25 mg Oral QPM  . tamsulosin  0.4 mg Oral QPC supper  . traZODone  25 mg Oral QHS   Continuous Infusions: . sodium chloride 75 mL/hr at 07/01/19 0139  . ampicillin-sulbactam (UNASYN) IV Stopped (07/01/19 0530)  . pantoprozole (PROTONIX) infusion 8 mg/hr (07/01/19 0035)  . [START ON 07/03/2019] vancomycin      Radiology Studies: Reviewed images personally in health database    LOS: 1 day   Time spent: [place], MD [facility]  07/01/2019, 7:17 AM

## 2019-07-01 NOTE — ED Notes (Signed)
MD paged about pt still being tachypneic and increasing HR to 120's, and increasing lactic.

## 2019-07-01 NOTE — ED Notes (Addendum)
RN notified MD that pt was still tachypneic and lactic was still elevated. RN notified that 65M was concerned about this pt being Tele appropriate. MD aware and states that she thinks he is still appropriate for Tele. MD also states pt is ok on RA. RN originally placed pt on 2L [place] to ease work of breathing.

## 2019-07-02 ENCOUNTER — Inpatient Hospital Stay (HOSPITAL_COMMUNITY): Payer: Medicare Other

## 2019-07-02 LAB — CBC
HCT: 26.5 % — ABNORMAL LOW (ref 39.0–52.0)
Hemoglobin: 9 g/dL — ABNORMAL LOW (ref 13.0–17.0)
MCH: 30 pg (ref 26.0–34.0)
MCHC: 34 g/dL (ref 30.0–36.0)
MCV: 88.3 fL (ref 80.0–100.0)
Platelets: 258 10*3/uL (ref 150–400)
RBC: 3 MIL/uL — ABNORMAL LOW (ref 4.22–5.81)
RDW: 15.9 % — ABNORMAL HIGH (ref 11.5–15.5)
WBC: 21.9 10*3/uL — ABNORMAL HIGH (ref 4.0–10.5)
nRBC: 0 % (ref 0.0–0.2)

## 2019-07-02 LAB — COMPREHENSIVE METABOLIC PANEL
ALT: 19 U/L (ref 0–44)
AST: 26 U/L (ref 15–41)
Albumin: 2 g/dL — ABNORMAL LOW (ref 3.5–5.0)
Alkaline Phosphatase: 92 U/L (ref 38–126)
Anion gap: 11 (ref 5–15)
BUN: 51 mg/dL — ABNORMAL HIGH (ref 8–23)
CO2: 16 mmol/L — ABNORMAL LOW (ref 22–32)
Calcium: 8 mg/dL — ABNORMAL LOW (ref 8.9–10.3)
Chloride: 116 mmol/L — ABNORMAL HIGH (ref 98–111)
Creatinine, Ser: 1.05 mg/dL (ref 0.61–1.24)
GFR calc Af Amer: >60 mL/min (ref >60)
GFR calc non Af Amer: >60 mL/min (ref >60)
Glucose, Bld: 105 mg/dL — ABNORMAL HIGH (ref 70–99)
Potassium: 3.6 mmol/L (ref 3.5–5.1)
Sodium: 143 mmol/L (ref 135–145)
Total Bilirubin: 0.3 mg/dL (ref 0.3–1.2)
Total Protein: 4.8 g/dL — ABNORMAL LOW (ref 6.5–8.1)

## 2019-07-02 LAB — C DIFFICILE QUICK SCREEN W PCR REFLEX
C Diff antigen: NEGATIVE
C Diff interpretation: NOT DETECTED
C Diff toxin: NEGATIVE

## 2019-07-02 NOTE — Progress Notes (Addendum)
Daily Rounding Note  07/02/2019, 9:54 AM  LOS: 2 days   SUBJECTIVE:   Chief complaint:   Limited Hematemesis PTA.    No further nausea or vomiting.  Abdominal pain improved.  Flexi-Seal rectal catheter placed overnight and brown, soft, unformed stool evident. Been pulling at lines, condom cath etc. so protective mittens placed.  OBJECTIVE:         Vital signs in last 24 hours:    Temp:  [97.4 F (36.3 C)-98.6 F (37 C)] (P) 98.2 F (36.8 C) (11/20 0954) Pulse Rate:  [79-107] 105 (11/20 0706) Resp:  [24-33] 25 (11/20 0706) BP: (105-123)/(51-72) 115/53 (11/20 0706) SpO2:  [91 %-100 %] 95 % (11/20 0706) Last BM Date: 07/01/19 Filed Weights   06/30/19 2006  Weight: 86.2 kg   General: Alert.  Looks frail.  Able to answer questions appropriately. Heart: RRR Chest: Greatly diminished breath sounds.  I do not hear any adventitious sounds.  Limited phlegmy cough.  No respiratory distress. Abdomen: Soft.  Minimal tenderness on the left abdomen.  Brown, soft, unformed stool, at least 600 mL present in Flexi-Seal Extremities: No CCE.  Detective mittens in place. Neuro/Psych: Alert.  Appropriate.  Follows commands.  Answers questions appropriately.  Intake/Output from previous day: 11/19 0701 - 11/20 0700 In: 1541 [I.V.:1441; IV Piggyback:100] Out: -   Intake/Output this shift: No intake/output data recorded.  Lab Results: Recent Labs    07/01/19 0327 07/01/19 0721 07/02/19 0200  WBC 22.8* 27.9* 21.9*  HGB 11.0* 11.0* 9.0*  HCT 34.8* 34.0* 26.5*  PLT 332 282 258   BMET Recent Labs    06/30/19 1935 07/01/19 0721 07/02/19 0200  NA 138 140 143  K 3.6 4.0 3.6  CL 111 109 116*  CO2 15* 17* 16*  GLUCOSE 166* 150* 105*  BUN 29* 37* 51*  CREATININE 1.28* 1.48* 1.05  CALCIUM 8.4* 8.4* 8.0*   LFT Recent Labs    06/30/19 1935 07/02/19 0200  PROT 6.1* 4.8*  ALBUMIN 2.7* 2.0*  AST 43* 26  ALT 24 19    ALKPHOS 135* 92  BILITOT 0.3 0.3   PT/INR Recent Labs    06/30/19 1950  LABPROT 15.4*  INR 1.2   Hepatitis Panel No results for input(s): HEPBSAG, HCVAB, HEPAIGM, HEPBIGM in the last 72 hours.  Studies/Results: Ct Abdomen Pelvis W Contrast  Result Date: 06/30/2019 CLINICAL DATA:  Hematemesis. Generalized weakness and diarrhea. EXAM: CT ABDOMEN AND PELVIS WITH CONTRAST TECHNIQUE: Multidetector CT imaging of the abdomen and pelvis was performed using the standard protocol following bolus administration of intravenous contrast. CONTRAST:  178mL OMNIPAQUE IOHEXOL 300 MG/ML  SOLN COMPARISON:  Abdominal radiographs dated 06/30/2019 FINDINGS: Lower chest: Elevation of the left hemidiaphragm. Bronchiectasis at the lung bases with slight accentuation of the interstitial markings and slight tree in bud densities at the right lung base. Slight atelectasis at the left lung base with pleural calcification at the left lung base posteriorly. No discrete effusion. Hepatobiliary: Liver parenchyma is normal. Dependent subtle densities in the gallbladder probably represent tiny stones. The gallbladder is not distended. No gallbladder wall thickening. Biliary tree is normal. Pancreas: Unremarkable. No pancreatic ductal dilatation or surrounding inflammatory changes. Spleen: Normal in size without focal abnormality. Adrenals/Urinary Tract: Adrenal glands are normal. No significant abnormality of the kidneys. No hydronephrosis. Bladder is normal. Stomach/Bowel: The stomach is distended with fluid. There is a slightly higher density material in the stomach which may represent ingested food or  a bezoar or clot. The duodenum and small bowel are normal. Appendix is normal. Large amount of stool in the rectum. Moderate stool in the transverse and descending portions of the colon. Vascular/Lymphatic: Aortic atherosclerosis. No enlarged abdominal or pelvic lymph nodes. Reproductive: There is a focal area of enhancement at the  base of the bladder to the left of midline measuring 12 x 20 x 7 mm. The patient has a history of resection of a bladder tumor as well as resection of the prostate gland. Other: No abdominal wall hernia or abnormality. No abdominopelvic ascites. Musculoskeletal: No acute or significant osseous findings. IMPRESSION: 1. Distended stomach filled with fluid and high density material which could represent a bezoar or clot. 2. Focal area of enhancement at the base of the bladder to the left of midline consistent with the patient's history of bladder tumor. This could represent scarring or tumor. 3. Large amount of stool in the rectum. 4. Probable tiny gallstones. 5. Elevation of the left hemidiaphragm. 6. Bronchiectasis at the lung bases with slight accentuation of the interstitial markings and tree in bud densities at the right lung base. 7. Aortic Atherosclerosis (ICD10-I70.0).a Electronically Signed   By: Lorriane Shire M.D.   On: 06/30/2019 21:52   Dg Abdomen Acute W/chest  Result Date: 06/30/2019 CLINICAL DATA:  GI bleed.  Concern for perforation. EXAM: DG ABDOMEN ACUTE W/ 1V CHEST COMPARISON:  03/18/2019 chest radiograph. FINDINGS: Stable cardiomediastinal silhouette with top-normal heart size. No pneumothorax. No pleural effusion. No pulmonary edema. Low lung volumes. Mild bibasilar scarring versus atelectasis. Questionable lucency under the left hemidiaphragm on the erect portable chest radiograph. No evidence of pneumatosis or pneumoperitoneum on the left lateral decubitus views. Relatively gasless abdomen with no dilated small bowel loops. Fluid levels scattered in the large bowel. No radiopaque nephrolithiasis. Degenerative changes in the lumbar spine. IMPRESSION: 1. Questionable lucency under the left hemidiaphragm on the upright portable chest radiograph, although with no evidence of pneumatosis or pneumoperitoneum on the left lateral decubitus views. If there is continued clinical concern for free air,  dedicated CT of the abdomen and pelvis with oral and IV contrast would be recommended for further evaluation. 2. Low lung volumes with mild bibasilar scarring versus atelectasis. 3. Relatively gasless abdomen with no specific findings of bowel obstruction. Scattered fluid levels in the large bowel. These results were called by telephone at the time of interpretation on 06/30/2019 at 8:59 pm to provider PA [facility] , who verbally acknowledged these results. Electronically Signed   By: Ilona Sorrel M.D.   On: 06/30/2019 20:53   Scheduled Meds:  bisacodyl  10 mg Rectal Once   memantine  10 mg Oral QHS   metoprolol tartrate  5 mg Intravenous Q6H   pantoprazole (PROTONIX) IV  40 mg Intravenous Q12H   risperiDONE  0.25 mg Oral QPM   tamsulosin  0.4 mg Oral QPC supper   traZODone  25 mg Oral QHS   Continuous Infusions:  sodium chloride 75 mL/hr at 07/02/19 0650   ampicillin-sulbactam (UNASYN) IV 3 g (07/02/19 0651)   PRN Meds:.LORazepam, ondansetron (ZOFRAN) IV   ASSESMENT:   *   Nausea, vomiting with hematemesis reported at assisted living last night FOBT negative. Vomiting has not recurred.  Patient denies nausea.  Still having abdominal pain. Day 3 Protonix 40 mg IV twice daily  *   ? GOO  *    Anemia.  MCV normal.  Hgb 12 >> 9.   *   Azotemia.  BUN rising while creatinine has normalized.  *      COVID-19 negative.  *     Leukocytosis, improving.    Suspect aspiration pneumonia.  Day 3 Unasyn.   PLAN   *   Continue NPO, IV bid Protonix.     *   Added KUB to ordered 2v CXR for today.      Billy [name]  07/02/2019, 9:54 AM Phone [phone]  I have discussed the case with the PA, and that is the plan I formulated. I personally interviewed and examined the patient.  He is still confused, can give no history or review of systems.  There is been no witnessed GI bleeding during this admission.  Looking at chest x-ray, he has small pleural effusions, but it  is a poor quality chest x-ray, low inspiratory volume.  Given overall presentation, still suspect he has underlying pneumonia.  If so, unknown if truly from aspiration.  We will follow.  No current plans for upper endoscopy given age, overall condition, and lack of ongoing GI bleeding.  The appearance of his stomach on CT scan is still worrisome, but we might opt for repeat CT scan prior to discharge if endoscopy is not pursued, to make sure that his stomach decompresses.    Nelida Meuse III Office: [phone]

## 2019-07-02 NOTE — TOC Initial Note (Signed)
Transition of Care [facility]) - Initial/Assessment Note    Patient Details  Name: Billy [name] MRN: [id number] Date of Birth: Jan 23, 1927  [facility]) CM/SW Contact:    Sharin Mons, RN Phone Number: 07/02/2019, 11:48 AM  Clinical Narrative:         Admitted with GI bleed. From ALF/ [facility]. NCM spoke with daughter Bethena Roys about d/c planning. Bethena Roys states PTA dad received PT/OT services @ ALF. Uses walker with ambulation.   Tery Sanfilippo (Son) Joella Prince (daughter)  Vickki Hearing (daughter) [phone]      [phone]          PT  evalulation pending...   Expected Discharge Plan: Assisted Living([facility]) Barriers to Discharge: Continued Medical Work up   Patient Goals and CMS Choice        Expected Discharge Plan and Services Expected Discharge Plan: Assisted Living([facility])     Prior Living Arrangements/Services    Activities of Daily Living      Permission Sought/Granted       Emotional Assessment       Admission diagnosis:  Tachypnea [R06.82] PNA (pneumonia) [J18.9] Hypotension, unspecified hypotension type [I95.9] Hematemesis, presence of nausea not specified [K92.0] Patient Active Problem List   Diagnosis Date Noted  . Upper GI bleed 06/30/2019  . Constipation 06/30/2019  . AKI (acute kidney injury) ([place]) 06/30/2019  . Hypotension 06/30/2019  . TIA (transient ischemic attack) 12/23/2017  . Stroke-like symptoms 12/22/2017  . Overactive bladder   . Hypertension   . Hypercholesterolemia   . Dementia ([place])   . 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 [place] [address] Phone: [phone] Fax: [phone]     Social Determinants of Health (SDOH) Interventions    Readmission Risk Interventions No flowsheet data found.

## 2019-07-02 NOTE — Progress Notes (Signed)
Pharmacy Antibiotic Note  Billy [name] is a 85 y.o. male admitted on 06/30/2019 with vomiting with hematemesis and r/o aspiration PNA.  Pharmacy has been consulted for Unasyn dosing.  ID:  r/o PNA.  LA 3.8>>5 (11/19) - Afebrile. WBC 21.9 down today, Scr 1.05 down,   Vanco 11/19 x 1 Unasyn 11/19>>  11/18: COVID negative 11/18: BC x 2>> 11/19: MrSA PCR: negative  Plan: Plan:  Unasyn 3g IV q8hr (while CrCl>30) Pharmacy will sign off. Please reconsult for further dosing assitance.   Height: 6' (182.9 cm) Weight: 190 lb (86.2 kg) IBW/kg (Calculated) : 77.6  Temp (24hrs), Avg:97.9 F (36.6 C), Min:97.4 F (36.3 C), Max:98.6 F (37 C)  Recent Labs  Lab 06/30/19 0102 06/30/19 1935 06/30/19 2051 07/01/19 0327 07/01/19 0721 07/02/19 0200  WBC  --  18.6*  --  22.8* 27.9* 21.9*  CREATININE  --  1.28*  --   --  1.48* 1.05  LATICACIDVEN 4.0*  --  3.8* 5.0*  --   --     Estimated Creatinine Clearance: 51.3 mL/min (by C-G formula based on SCr of 1.05 mg/dL).    No Known Allergies   Euretha Najarro S. Alford Highland, PharmD, BCPS Clinical Staff Pharmacist Eilene Ghazi Stillinger 07/02/2019 9:46 AM

## 2019-07-02 NOTE — Progress Notes (Signed)
Hospitalist progress note  If 7PM-7AM,  night-coverage-look on AMION -prefer pages-not epic chat,please  Nima Sill [id number] DOB: 1927/01/15 DOA: 06/30/2019  PCP: Reymundo Poll, MD   Narrative:  66 skilled nursing facility year-old white male HTN, overactive bladder, TIA 12/2017, HLD, possible dementia, bladder cancer prostate cancer and obstructive uropathy status post surgery  Admit 06/30/2019 hematemesis BRB-initially hypotensive 80s over 50s after 3 L fluid bolus WBC 18-->22 hemoglobin 12 and stable BUN/creatinine 29/1.2 >baseline 0.8 CT abdomen pelvis?  Vasa were clot and large stool burden Started on Protonix GTT  Assessment & Plan: Hypovolemic shock-cause unclear 3 L IV fluid given on admit-swithc to 75cc/h rate Hemoglobin minimally dropped Suspect could be aspiration from Vomit continue Unasyn for now--when cleared for diet switch to Augmentin Will check CDFIF from stool given diarrea AKI with metabolic acidosis Continue IVF as aboce--slightly worse today Add bicarb in am if worse Leukocytosis?  Pneumonia vs diarrheal illness Possibility of aspiration therefore covering with Unasyn monotherapy-repeat CXR shows no overt aspiration and film to my review actually looks better today Prior TIA For now holding aspirin until clear no actual bleed HTN Hypertension shock on admission, placing on low-dose metoprolol IV every 6 hourly with hold parameters for blood pressures HLD Holding statin at this time Bladder/prostate cancer status post TURP  SCD, called son Sundra Aland [id number] no response, DNR, telemetry  Subjective:  Can orient only to presdent thisnk this is 2004 About to go for xrays Flexiseal overnight placed-not informed of copious diarr Remains improved but still confused overall  Consultants:   GI pending Procedures:   No Antimicrobials:   Narrowed to Unasyn  Data Reviewed: I have personally reviewed following labs and imaging studies  White count up  from 18.6-22 hemoglobin down from 12-11.0 platelets stable Lactic acid cycled from 4-3 0.8-5.0  Bun creat up from 37/1.48-->51/1.05  Objective: Vitals:   07/02/19 0316 07/02/19 0324 07/02/19 0706 07/02/19 0954  BP:  120/69 (!) 115/53 126/63  Pulse:  (!) 107 (!) 105   Resp: (!) 24  (!) 25 (!) 26  Temp: 98.2 F (36.8 C) 98.2 F (36.8 C) 98.3 F (36.8 C) 98.2 F (36.8 C)  TempSrc: Axillary Oral Axillary Oral  SpO2: 96% 97% 95% 96%  Weight:      Height:        Intake/Output Summary (Last 24 hours) at 07/02/2019 1144 Last data filed at 07/01/2019 1600 Gross per 24 hour  Intake 1541.01 ml  Output -  Net 1541.01 ml   Filed Weights   06/30/19 2006  Weight: 86.2 kg    Examination: Awake incoherent but alert no distress Seems quite anxious S1-S2 tachycardic cannot really appreciate murmur Chest clinically clear no added sound no rales Abdomen soft no rebound no guarding No lower extremity edema   Scheduled Meds: . bisacodyl  10 mg Rectal Once  . memantine  10 mg Oral QHS  . metoprolol tartrate  5 mg Intravenous Q6H  . pantoprazole (PROTONIX) IV  40 mg Intravenous Q12H  . risperiDONE  0.25 mg Oral QPM  . tamsulosin  0.4 mg Oral QPC supper  . traZODone  25 mg Oral QHS   Continuous Infusions: . sodium chloride 75 mL/hr at 07/02/19 0650  . ampicillin-sulbactam (UNASYN) IV 3 g (07/02/19 [id number])    Radiology Studies: Reviewed images personally in health database    LOS: 2 days   Time spent: [place], MD [facility]  07/02/2019, 11:44 AM

## 2019-07-02 NOTE — Care Management Important Message (Signed)
Important Message  Patient Details  Name: Billy [name] MRN: [id number] Date of Birth: 09-04-1926   Medicare Important Message Given:  No  Precaution in place No IM given  Orbie Pyo 07/02/2019, 1:54 PM

## 2019-07-03 ENCOUNTER — Inpatient Hospital Stay (HOSPITAL_COMMUNITY): Payer: Medicare Other

## 2019-07-03 DIAGNOSIS — K921 Melena: Secondary | ICD-10-CM

## 2019-07-03 DIAGNOSIS — D62 Acute posthemorrhagic anemia: Secondary | ICD-10-CM

## 2019-07-03 LAB — CBC WITH DIFFERENTIAL/PLATELET
Abs Immature Granulocytes: 0.08 10*3/uL — ABNORMAL HIGH (ref 0.00–0.07)
Basophils Absolute: 0 10*3/uL (ref 0.0–0.1)
Basophils Relative: 0 %
Eosinophils Absolute: 0.1 10*3/uL (ref 0.0–0.5)
Eosinophils Relative: 1 %
HCT: 25.1 % — ABNORMAL LOW (ref 39.0–52.0)
Hemoglobin: 8.6 g/dL — ABNORMAL LOW (ref 13.0–17.0)
Immature Granulocytes: 1 %
Lymphocytes Relative: 12 %
Lymphs Abs: 1.5 10*3/uL (ref 0.7–4.0)
MCH: 30.4 pg (ref 26.0–34.0)
MCHC: 34.3 g/dL (ref 30.0–36.0)
MCV: 88.7 fL (ref 80.0–100.0)
Monocytes Absolute: 0.7 10*3/uL (ref 0.1–1.0)
Monocytes Relative: 6 %
Neutro Abs: 10.2 10*3/uL — ABNORMAL HIGH (ref 1.7–7.7)
Neutrophils Relative %: 80 %
Platelets: 220 10*3/uL (ref 150–400)
RBC: 2.83 MIL/uL — ABNORMAL LOW (ref 4.22–5.81)
RDW: 16 % — ABNORMAL HIGH (ref 11.5–15.5)
WBC: 12.6 10*3/uL — ABNORMAL HIGH (ref 4.0–10.5)
nRBC: 0 % (ref 0.0–0.2)

## 2019-07-03 LAB — CBC
HCT: 26.3 % — ABNORMAL LOW (ref 39.0–52.0)
Hemoglobin: 8.6 g/dL — ABNORMAL LOW (ref 13.0–17.0)
MCH: 30 pg (ref 26.0–34.0)
MCHC: 32.7 g/dL (ref 30.0–36.0)
MCV: 91.6 fL (ref 80.0–100.0)
Platelets: 233 10*3/uL (ref 150–400)
RBC: 2.87 MIL/uL — ABNORMAL LOW (ref 4.22–5.81)
RDW: 16.1 % — ABNORMAL HIGH (ref 11.5–15.5)
WBC: 11.3 10*3/uL — ABNORMAL HIGH (ref 4.0–10.5)
nRBC: 0 % (ref 0.0–0.2)

## 2019-07-03 LAB — MAGNESIUM: Magnesium: 1.9 mg/dL (ref 1.7–2.4)

## 2019-07-03 LAB — RENAL FUNCTION PANEL
Albumin: 2 g/dL — ABNORMAL LOW (ref 3.5–5.0)
Anion gap: 7 (ref 5–15)
BUN: 38 mg/dL — ABNORMAL HIGH (ref 8–23)
CO2: 20 mmol/L — ABNORMAL LOW (ref 22–32)
Calcium: 8.4 mg/dL — ABNORMAL LOW (ref 8.9–10.3)
Chloride: 119 mmol/L — ABNORMAL HIGH (ref 98–111)
Creatinine, Ser: 0.79 mg/dL (ref 0.61–1.24)
GFR calc Af Amer: >60 mL/min (ref >60)
GFR calc non Af Amer: >60 mL/min (ref >60)
Glucose, Bld: 94 mg/dL (ref 70–99)
Phosphorus: 1.7 mg/dL — ABNORMAL LOW (ref 2.5–4.6)
Potassium: 2.8 mmol/L — ABNORMAL LOW (ref 3.5–5.1)
Sodium: 146 mmol/L — ABNORMAL HIGH (ref 135–145)

## 2019-07-03 LAB — OCCULT BLOOD X 1 CARD TO LAB, STOOL: Fecal Occult Bld: POSITIVE — AB

## 2019-07-03 MED ORDER — WHITE PETROLATUM EX OINT
TOPICAL_OINTMENT | CUTANEOUS | Status: AC
  Administered 2019-07-03: 14:00:00
  Filled 2019-07-03: qty 28.35

## 2019-07-03 MED ORDER — KCL IN DEXTROSE-NACL 40-5-0.9 MEQ/L-%-% IV SOLN
INTRAVENOUS | Status: DC
  Administered 2019-07-03 – 2019-07-06 (×5): via INTRAVENOUS
  Filled 2019-07-03 (×6): qty 1000

## 2019-07-03 MED ORDER — SODIUM CHLORIDE 0.9 % IV SOLN
8.0000 mg/h | INTRAVENOUS | Status: AC
  Administered 2019-07-03 – 2019-07-06 (×5): 8 mg/h via INTRAVENOUS
  Filled 2019-07-03 (×7): qty 80

## 2019-07-03 MED ORDER — SODIUM CHLORIDE 0.9 % IV SOLN
80.0000 mg | Freq: Once | INTRAVENOUS | Status: AC
  Administered 2019-07-03: 80 mg via INTRAVENOUS
  Filled 2019-07-03: qty 80

## 2019-07-03 NOTE — Progress Notes (Signed)
PT Cancellation Note  Patient Details Name: Billy [name] MRN: [id number] DOB: June 23, 1927   Cancelled Treatment:    Reason Eval/Treat Not Completed: Patient at procedure or test/unavailable (abdominal CT).  Ellamae Sia, PT, DPT Acute Rehabilitation Services Pager [phone] Office [phone]    Willy Eddy 07/03/2019, 11:43 AM

## 2019-07-03 NOTE — Progress Notes (Addendum)
  Held evening dose of metoprolol - patient BP was 101/49 MAP 65. Otherwise, patient maintained vitals and mental status. Monitored closely throughout the night. Blood pressure came back up this morning and gave the scheduled 6am metoprolol.

## 2019-07-03 NOTE — Progress Notes (Signed)
Hospitalist progress note  If 7PM-7AM,  night-coverage-look on AMION -prefer pages-not epic chat,please  Billy [name] [id number] DOB: 24-Jan-1927 DOA: 06/30/2019  PCP: Reymundo Poll, MD   Narrative:  61 skilled nursing facility year-old white male HTN, overactive bladder, TIA 12/2017, HLD, possible dementia, bladder cancer prostate cancer and obstructive uropathy status post surgery  Admit 06/30/2019 hematemesis BRB-initially hypotensive 80s over 50s after 3 L fluid bolus WBC 18-->22 hemoglobin 12 and stable BUN/creatinine 29/1.2 >baseline 0.8 CT abdomen pelvis? Bezoar vs clot clot and large stool burden Started on Protonix GTT  Assessment & Plan: Hypovolemic shock-cause unclear resus with 3 l in ED Hemoglobin dropped form 11--> 9 range but getting IVF Ct on admit showed Bezoar vs clot---will start liquid diet today to see how things go--his flexiseal output is dark and unformed--will hemoccult ?require repeat scan or work up per GI continue Unasyn for now--when cleared for diet switch to Augmentin cdiff is negative AKI with metabolic acidosis Continue IVF as aboce--labs are imprioved to some degree Hypokalemia Change IVF 11/21 to d5 + K with 40 of K labs in am -mag 1.9 Leukocytosis?  Pneumonia vs diarrheal illness Possibility of aspiration therefore covering with Unasyn monotherapy-repeat CXR s11/21 was improved Prior TIA For now holding aspirin until clear no actual bleed HTN  shock on admission, placing on low-dose metoprolol IV every 6 hourly with hold parameters for blood pressures HLD Holding statin at this time Bladder/prostate cancer status post TURP  SCD, called son Sundra Aland [id number] updtaed bnoth him and daughter in person 11.20 DNR, telemetry  Subjective:  Awakens no pain cannot orient in nad no cough no cold  Consultants:   GI pending Procedures:   No Antimicrobials:   Narrowed to Unasyn  Data Reviewed: I have personally reviewed following labs and imaging  studies  White count up from 18.6-22 --->12.6 hemoglobin down from 12-11.0-->8.6 Lactic acid cycled from 4-3 0.8-5.0  Bun creat up from 37/1.48-->51/1.05--->38/0.79 K is 2.8  Objective: Vitals:   07/02/19 1828 07/02/19 2109 07/03/19 0031 07/03/19 0515  BP: (!) 124/52 (!) 130/57 (!) 101/49 133/66  Pulse:  96  88  Resp: (!) 26     Temp: 98.7 F (37.1 C) 98.7 F (37.1 C)  97.6 F (36.4 C)  TempSrc: Oral Oral    SpO2: 95%   99%  Weight:      Height:        Intake/Output Summary (Last 24 hours) at 07/03/2019 1012 Last data filed at 07/02/2019 2250 Gross per 24 hour  Intake 2223.75 ml  Output 700 ml  Net 1523.75 ml   Filed Weights   06/30/19 2006  Weight: 86.2 kg    Examination: Awake incoherent but alert no distress Seems quite anxious S1-S2 tachycardic cannot really appreciate murmur Chest clinically clear no added sound no rales Abdomen soft no rebound no guarding No lower extremity edema   Scheduled Meds: . memantine  10 mg Oral QHS  . metoprolol tartrate  5 mg Intravenous Q6H  . pantoprazole (PROTONIX) IV  40 mg Intravenous Q12H  . risperiDONE  0.25 mg Oral QPM  . tamsulosin  0.4 mg Oral QPC supper  . traZODone  25 mg Oral QHS   Continuous Infusions: . ampicillin-sulbactam (UNASYN) IV 3 g (07/03/19 [id number])  . dextrose 5 % and 0.9 % NaCl with KCl 40 mEq/L      Radiology Studies: Reviewed images personally in health database    LOS: 3 days   Time spent: [place], MD [facility]  07/03/2019, 10:12 AM

## 2019-07-03 NOTE — Progress Notes (Addendum)
[facility] Progress Note  Chief Complaint: GI bleeding  History:  Dr. Verlon Au of the hospitalist service called me and asked me to reevaluate patient today, indicating that he has started passing liquid black stool requiring Flexi-Seal placement.  WBC has been coming down and antibiotics, hemoglobin is also decreasing, and Dr. Verlon Au does not think this patient has pneumonia.  Patient remains obtunded, still wearing mitts for protection, laying flat in bed, and will not open his eyes and responds only to painful stimuli.   Provide no history or review of systems.  Objective:   Current Facility-Administered Medications:  .  Ampicillin-Sulbactam (UNASYN) 3 g in sodium chloride 0.9 % 100 mL IVPB, 3 g, Intravenous, Q8H, Tu, Ching T, DO, Last Rate: 200 mL/hr at 07/03/19 0639, 3 g at 07/03/19 0639 .  dextrose 5 % and 0.9 % NaCl with KCl 40 mEq/L infusion, , Intravenous, Continuous, Samtani, Jai-Gurmukh, MD .  LORazepam (ATIVAN) injection 0.5 mg, 0.5 mg, Intravenous, Q4H PRN, Nita Sells, MD, 0.5 mg at 07/01/19 1820 .  memantine (NAMENDA) tablet 10 mg, 10 mg, Oral, QHS, Tu, Ching T, DO, 10 mg at 07/02/19 2251 .  metoprolol tartrate (LOPRESSOR) injection 5 mg, 5 mg, Intravenous, Q6H, Samtani, Jai-Gurmukh, MD, 5 mg at 07/03/19 0624 .  ondansetron (ZOFRAN) injection 4 mg, 4 mg, Intravenous, Q6H PRN, Tu, Ching T, DO .  pantoprazole (PROTONIX) 80 mg in sodium chloride 0.9 % 100 mL IVPB, 80 mg, Intravenous, Once, Danis, Estill Cotta III, MD .  pantoprazole (PROTONIX) 80 mg in sodium chloride 0.9 % 250 mL (0.32 mg/mL) infusion, 8 mg/hr, Intravenous, Continuous, Danis, Estill Cotta III, MD .  risperiDONE (RISPERDAL) tablet 0.25 mg, 0.25 mg, Oral, QPM, Tu, Ching T, DO .  tamsulosin (FLOMAX) capsule 0.4 mg, 0.4 mg, Oral, QPC supper, Tu, Ching T, DO .  traZODone (DESYREL) tablet 25 mg, 25 mg, Oral, QHS, Tu, Ching T, DO, 25 mg at 07/02/19 2251  . ampicillin-sulbactam (UNASYN) IV 3 g (07/03/19 [id number])  .  dextrose 5 % and 0.9 % NaCl with KCl 40 mEq/L    . pantoprazole (PROTONIX) IVPB    . pantoprozole (PROTONIX) infusion       Vital signs in last 24 hrs: Vitals:   07/03/19 0031 07/03/19 0515  BP: (!) 101/49 133/66  Pulse:  88  Resp:    Temp:  97.6 F (36.4 C)  SpO2:  99%    Intake/Output Summary (Last 24 hours) at 07/03/2019 1101 Last data filed at 07/02/2019 2250 Gross per 24 hour  Intake 2223.75 ml  Output 700 ml  Net 1523.75 ml     Physical Exam General appearance as noted above.  HEENT: sclera anicteric, oral mucosa not well visualized, dry  Cardiac: RRR without murmurs, S1S2 heard, no peripheral edema  Pulm: clear to auscultation bilaterally, but only fair inspiratory effort, normal RR  Abdomen: soft, occult to determine if tenderness, with active bowel sounds. No guarding or palpable hepatosplenomegaly.  He is not distended or tympanic.  Skin; warm and dry, no jaundice.  Pale  Recent Labs:  CBC Latest Ref Rng & Units 07/03/2019 07/02/2019 07/01/2019  WBC 4.0 - 10.5 K/uL 12.6(H) 21.9(H) 27.9(H)  Hemoglobin 13.0 - 17.0 g/dL 8.6(L) 9.0(L) 11.0(L)  Hematocrit 39.0 - 52.0 % 25.1(L) 26.5(L) 34.0(L)  Platelets 150 - 400 K/uL 220 258 282    Recent Labs  Lab 06/30/19 1950  INR 1.2   CMP Latest Ref Rng & Units 07/03/2019 07/02/2019 07/01/2019  Glucose 70 - 99 mg/dL  94 105(H) 150(H)  BUN 8 - 23 mg/dL 38(H) 51(H) 37(H)  Creatinine 0.61 - 1.24 mg/dL 0.79 1.05 1.48(H)  Sodium 135 - 145 mmol/L 146(H) 143 140  Potassium 3.5 - 5.1 mmol/L 2.8(L) 3.6 4.0  Chloride 98 - 111 mmol/L 119(H) 116(H) 109  CO2 22 - 32 mmol/L 20(L) 16(L) 17(L)  Calcium 8.9 - 10.3 mg/dL 8.4(L) 8.0(L) 8.4(L)  Total Protein 6.5 - 8.1 g/dL - 4.8(L) -  Total Bilirubin 0.3 - 1.2 mg/dL - 0.3 -  Alkaline Phos 38 - 126 U/L - 92 -  AST 15 - 41 U/L - 26 -  ALT 0 - 44 U/L - 19 -    @ASSESSMENTPLANBEGIN @ Assessment: Hematemesis, reported the day of admission, none witnessed since admission. New  onset melena today Acute blood loss anemia, suspect there is probably an underlying anemia as well since it has decreased with volume resuscitation and improvement in renal function.  Underlying severe dementia.  I had a long discussion with his son [name] over the phone.  It sounds like his father's overall functional status has been steadily declining over this last year.  I am very concerned that invasive procedures like endoscopy would be very high risk for him, and I would like to know their goals of care.  He has 2 siblings that he will be talking with today.  It sounds like they have been realistic about his declining health.  I have changed his Protonix to a bolus and drip, and am obtaining a stat noncontrast abdominal CT scan to see if the stomach still has an obstructed appearance as it did on admission. If so, then we will an upper endoscopy would be a high risk procedure with low expectation of diagnostic (if stomach full of food, blood, tumor or all of the above) and low expectation of being therapeutic.  If that turns out to be the case, comfort care should be considered.  His son is coming in to be with his father and also speak with Dr. Verlon Au, who I have updated about this.  Total time 35 minutes  Nelida Meuse III Office: [phone]   Addendum 2:30 PM  Noncontrast abdominal CT scan shows that the stomach has completely deflated compared to the original scan.  Possible sigmoid inflammation consistent with diverticulitis, incompletely visualized.  Dr. Verlon Au is aware, and patient is on Unasyn. Mr. Sole is also more awake than even earlier today when I saw him.  He is still confused and somnolent, but at least opens eyes and responds a little verbally.  His son [name] is at the bedside and we had a conversation about his dad's condition and plan.  There is still a small amount of liquid black stool in the collection bag, does not been emptied since earlier today.  I am no  longer as concerned about a gastric outlet obstruction after seeing this CT scan.  And it seems we may have an infectious source in the way of diverticulitis, and will treat as such since he is clinically improved in the last few days with antibiotics.  I am still concerned about the risks of performing an upper endoscopy on him for this GI bleeding that is probably from an upper source given its appearance and the elevated BUN.  We will see what the next 24 hours brings with this patient's mental status, respiratory status, any ongoing bleeding, hemoglobin level and family's desire to pursue any additional testing.  Protonix drip has been started earlier this morning,  we will get another CBC this evening and tomorrow morning.   - Wilfrid Lund, MD    [facility]

## 2019-07-04 LAB — CBC
HCT: 25.8 % — ABNORMAL LOW (ref 39.0–52.0)
Hemoglobin: 8.5 g/dL — ABNORMAL LOW (ref 13.0–17.0)
MCH: 29.6 pg (ref 26.0–34.0)
MCHC: 32.9 g/dL (ref 30.0–36.0)
MCV: 89.9 fL (ref 80.0–100.0)
Platelets: 227 10*3/uL (ref 150–400)
RBC: 2.87 MIL/uL — ABNORMAL LOW (ref 4.22–5.81)
RDW: 16 % — ABNORMAL HIGH (ref 11.5–15.5)
WBC: 10.3 10*3/uL (ref 4.0–10.5)
nRBC: 0 % (ref 0.0–0.2)

## 2019-07-04 LAB — RENAL FUNCTION PANEL
Albumin: 2.1 g/dL — ABNORMAL LOW (ref 3.5–5.0)
Anion gap: 6 (ref 5–15)
BUN: 20 mg/dL (ref 8–23)
CO2: 21 mmol/L — ABNORMAL LOW (ref 22–32)
Calcium: 8.1 mg/dL — ABNORMAL LOW (ref 8.9–10.3)
Chloride: 119 mmol/L — ABNORMAL HIGH (ref 98–111)
Creatinine, Ser: 0.61 mg/dL (ref 0.61–1.24)
GFR calc Af Amer: >60 mL/min (ref >60)
GFR calc non Af Amer: >60 mL/min (ref >60)
Glucose, Bld: 116 mg/dL — ABNORMAL HIGH (ref 70–99)
Phosphorus: 2.6 mg/dL (ref 2.5–4.6)
Potassium: 2.6 mmol/L — CL (ref 3.5–5.1)
Sodium: 146 mmol/L — ABNORMAL HIGH (ref 135–145)

## 2019-07-04 MED ORDER — METOPROLOL TARTRATE 12.5 MG HALF TABLET
12.5000 mg | ORAL_TABLET | Freq: Every day | ORAL | Status: DC
  Administered 2019-07-04 – 2019-07-07 (×4): 12.5 mg via ORAL
  Filled 2019-07-04 (×4): qty 1

## 2019-07-04 MED ORDER — BOOST / RESOURCE BREEZE PO LIQD CUSTOM
1.0000 | Freq: Three times a day (TID) | ORAL | Status: DC
  Administered 2019-07-04 – 2019-07-07 (×9): 1 via ORAL

## 2019-07-04 MED ORDER — POTASSIUM CHLORIDE 10 MEQ/100ML IV SOLN
10.0000 meq | INTRAVENOUS | Status: AC
  Administered 2019-07-04 (×4): 10 meq via INTRAVENOUS
  Filled 2019-07-04 (×4): qty 100

## 2019-07-04 NOTE — Progress Notes (Signed)
Hospitalist progress note  If 7PM-7AM,  night-coverage-look on AMION -prefer pages-not epic chat,please  Billy [name] [id number] DOB: [birth year] DOA: 06/30/2019  PCP: Reymundo Poll, MD   Narrative:  55 [facility] year-old white male HTN, overactive bladder, TIA 12/2017, HLD,severe dementia, bladder cancer prostate cancer and obstructive uropathy status post surgery  Admit 06/30/2019 [possible hematemesis BRB-initially hypotensive 80s over 50s after 3 L fluid bolus WBC 18-->22 hemoglobin 12 and stable BUN/creatinine 29/1.2 >baseline 0.8 CT abdomen pelvis? Bezoar vs clot clot and large stool burden Started on Protonix GTT Developed during hospital stay further dark stool and flexiseal was placed in addition  Assessment & Plan: Hypovolemic shock-cause unclear resus with 3 l in ED Hemoglobin dropped form 11--> 8.6 Ct on admit showed Bezoar vs clot--- repeat scan 1122 inflammatory changes Continue Protonix drip continue Unasyn cdiff is negative Might graduate diet appreciate GI input -patient is not a good candidate for scope to rule out other causes of bleeding I have had long discussions with family regarding overall prognosis and planning  AKI with metabolic acidosis Continue IVF Hypokalemia Change IVF 11/21 to d5 + K with 40 of K labs in am -mag 1.9-labs from today are still pending Leukocytosis?  Pneumonia vs diarrheal illness Possibility of aspiration therefore covering with Unasyn monotherapy-repeat CXRs 11/21 was improved Prior TIA For now holding aspirin until clear no actual bleed HTN shock on admission, transition from IV to home po metoprolol 12.5 daily HLD Holding statin at this time Bladder/prostate cancer status post TURP  SCD, son being updated daily  Subjective:  Agitated overnight-needed ativan More calm now still in retraints Do not see any stool in flexiseal ROS   Consultants:   GI Dr. Loletha Carrow seeing patient CT scan on admission Procedures:    CT scan on admission  CT scan 11/21 showing colitis Antimicrobials:   Narrowed to Unasyn  Data Reviewed: I have personally reviewed following labs and imaging studies  White count up from 18.6-22 --->12.6-->10 hemoglobin down from 12-11.0-->8.6-->8.5 Lactic acid cycled from 4-3 0.8-5.0  Labs pending  Objective: Vitals:   07/03/19 1600 07/03/19 2142 07/04/19 0538 07/04/19 0730  BP: 135/87 127/66 125/70 132/73  Pulse:  83 80 82  Resp: 18 20 18 17   Temp:  97.8 F (36.6 C) 98 F (36.7 C) 98.3 F (36.8 C)  TempSrc:  Oral Oral Oral  SpO2:  99% 99% 99%  Weight:      Height:        Intake/Output Summary (Last 24 hours) at 07/04/2019 1007 Last data filed at 07/04/2019 1001 Gross per 24 hour  Intake 1531.22 ml  Output 1350 ml  Net 181.22 ml   Filed Weights   06/30/19 2006  Weight: 86.2 kg    Examination: Awake incoherent but alert no distress Seems quite anxious S1-S2 tachycardic cannot really appreciate murmur Chest clinically clear no added sound no rales Abdomen soft no rebound no guarding No lower extremity edema   Scheduled Meds: . feeding supplement  1 Container Oral TID BM  . memantine  10 mg Oral QHS  . metoprolol tartrate  5 mg Intravenous Q6H  . risperiDONE  0.25 mg Oral QPM  . tamsulosin  0.4 mg Oral QPC supper  . traZODone  25 mg Oral QHS   Continuous Infusions: . ampicillin-sulbactam (UNASYN) IV 200 mL/hr at 07/04/19 0009  . dextrose 5 % and 0.9 % NaCl with KCl 40 mEq/L 75 mL/hr at 07/04/19 0432  . pantoprozole (PROTONIX) infusion 8 mg/hr (07/03/19 2356)  Radiology Studies: Reviewed images personally in health database    LOS: 4 days   Time spent: [place], MD [facility]  07/04/2019, 10:07 AM

## 2019-07-04 NOTE — Progress Notes (Signed)
[facility] Progress Note  Chief Complaint: Melena  History:  Billy [name] continues to have loose black stool in his fecal collection bag.  However, his hemoglobin is remained stable from yesterday on Protonix drip.  While he is confused, he is awake enough to say that he does not have abdominal pain, and is not tender on exam.  His son Billy [name] has been with him a lot the last 2 days and we have had multiple conversations about his dad's plan.  ROS: Denies chest pain.  Has a wet cough, does not feel short of breath. Beyond that, cannot obtain helpful review of systems due to his confusion.  Objective:   Current Facility-Administered Medications:  .  Ampicillin-Sulbactam (UNASYN) 3 g in sodium chloride 0.9 % 100 mL IVPB, 3 g, Intravenous, Q8H, Tu, Ching T, DO, Last Rate: 200 mL/hr at 07/04/19 1034, 3 g at 07/04/19 1034 .  dextrose 5 % and 0.9 % NaCl with KCl 40 mEq/L infusion, , Intravenous, Continuous, Samtani, Jai-Gurmukh, MD, Last Rate: 75 mL/hr at 07/04/19 0432 .  feeding supplement (BOOST / RESOURCE BREEZE) liquid 1 Container, 1 Container, Oral, TID BM, Nelida Meuse III, MD, 1 Container at 07/04/19 1239 .  LORazepam (ATIVAN) injection 0.5 mg, 0.5 mg, Intravenous, Q4H PRN, Nita Sells, MD, 0.5 mg at 07/04/19 0325 .  memantine (NAMENDA) tablet 10 mg, 10 mg, Oral, QHS, Tu, Ching T, DO, 10 mg at 07/03/19 2338 .  metoprolol tartrate (LOPRESSOR) tablet 12.5 mg, 12.5 mg, Oral, Daily, Samtani, Jai-Gurmukh, MD, 12.5 mg at 07/04/19 1240 .  ondansetron (ZOFRAN) injection 4 mg, 4 mg, Intravenous, Q6H PRN, Tu, Ching T, DO .  pantoprazole (PROTONIX) 80 mg in sodium chloride 0.9 % 250 mL (0.32 mg/mL) infusion, 8 mg/hr, Intravenous, Continuous, Danis, Estill Cotta III, MD, Last Rate: 25 mL/hr at 07/04/19 1034, 8 mg/hr at 07/04/19 1034 .  potassium chloride 10 mEq in 100 mL IVPB, 10 mEq, Intravenous, Q1 Hr x 4, Samtani, Jai-Gurmukh, MD, Last Rate: 100 mL/hr at 07/04/19 1238, 10 mEq at 07/04/19 1238 .   risperiDONE (RISPERDAL) tablet 0.25 mg, 0.25 mg, Oral, QPM, Tu, Ching T, DO, 0.25 mg at 07/03/19 1633 .  tamsulosin (FLOMAX) capsule 0.4 mg, 0.4 mg, Oral, QPC supper, Tu, Ching T, DO .  traZODone (DESYREL) tablet 25 mg, 25 mg, Oral, QHS, Tu, Ching T, DO, 25 mg at 07/03/19 2339  . ampicillin-sulbactam (UNASYN) IV 3 g (07/04/19 1034)  . dextrose 5 % and 0.9 % NaCl with KCl 40 mEq/L 75 mL/hr at 07/04/19 0432  . pantoprozole (PROTONIX) infusion 8 mg/hr (07/04/19 1034)  . potassium chloride 10 mEq (07/04/19 1238)     Vital signs in last 24 hrs: Vitals:   07/04/19 0538 07/04/19 0730  BP: 125/70 132/73  Pulse: 80 82  Resp: 18 17  Temp: 98 F (36.7 C) 98.3 F (36.8 C)  SpO2: 99% 99%    Intake/Output Summary (Last 24 hours) at 07/04/2019 1250 Last data filed at 07/04/2019 1001 Gross per 24 hour  Intake 1531.22 ml  Output 1350 ml  Net 181.22 ml     Physical Exam Febrile elderly man, laying in bed, protective mittens on.  HEENT: sclera anicteric, oral mucosa very dry  Neck: supple, no thyromegaly, JVD or lymphadenopathy  Cardiac: RRR without murmurs, S1S2 heard, no peripheral edema  Pulm: clear to auscultation bilaterally, normal RR and only fair effort noted  Abdomen: soft, no tenderness, with active bowel sounds. No guarding or palpable hepatosplenomegaly  Skin; warm and  dry, no jaundice, pale  Recent Labs:  CBC Latest Ref Rng & Units 07/04/2019 07/03/2019 07/03/2019  WBC 4.0 - 10.5 K/uL 10.3 11.3(H) 12.6(H)  Hemoglobin 13.0 - 17.0 g/dL 8.5(L) 8.6(L) 8.6(L)  Hematocrit 39.0 - 52.0 % 25.8(L) 26.3(L) 25.1(L)  Platelets 150 - 400 K/uL 227 233 220    Recent Labs  Lab 06/30/19 1950  INR 1.2   CMP Latest Ref Rng & Units 07/04/2019 07/03/2019 07/02/2019  Glucose 70 - 99 mg/dL 116(H) 94 105(H)  BUN 8 - 23 mg/dL 20 38(H) 51(H)  Creatinine 0.61 - 1.24 mg/dL 0.61 0.79 1.05  Sodium 135 - 145 mmol/L 146(H) 146(H) 143  Potassium 3.5 - 5.1 mmol/L 2.6(LL) 2.8(L) 3.6  Chloride  98 - 111 mmol/L 119(H) 119(H) 116(H)  CO2 22 - 32 mmol/L 21(L) 20(L) 16(L)  Calcium 8.9 - 10.3 mg/dL 8.1(L) 8.4(L) 8.0(L)  Total Protein 6.5 - 8.1 g/dL - - 4.8(L)  Total Bilirubin 0.3 - 1.2 mg/dL - - 0.3  Alkaline Phos 38 - 126 U/L - - 92  AST 15 - 41 U/L - - 26  ALT 0 - 44 U/L - - 19     Radiologic studies: See addendum to yesterday's note. Repeat noncontrast CT abdomen showed deflation of stomach, and possibly inflammation in the sigmoid, though that area was incompletely visualized on the study and there was no contrast.  Dr. Verlon Au and I agreed this might be diverticulitis as a source for his initial sepsis looking picture.  Dr. Lennie Muckle he does not feel this patient has had pneumonia.  @ASSESSMENTPLANBEGIN @ Assessment: Hematemesis reported at patient's facility and prompted his admission.  None witnessed since hospitalized. Melena -   really loose black stool at least in part from antibiotics but also from GI bleeding.  I am hopeful he is just still passing some old blood since the hemoglobin is remained stable since starting Protonix drip yesterday. Advanced dementia, general debility  I suspect he has peptic ulcer disease, perhaps from his chronic aspirin use which was reportedly given because of history of cerebrovascular disease. This patient's son Billy [name] would like to take a conservative route, continue acid suppression, regular monitoring of the hemoglobin and not do an upper endoscopy if it can be avoided.  I am hopeful that the bleeding will stop with this plan, since this patient would be at high risk for sedation related complications from an upper endoscopy.  At this point, the risk-benefit ratio does not favor pursuing that.  Plan: Clear liquid diet and boost breeze today. Check hemoglobin hematocrit tonight and tomorrow morning. Our team will round on him tomorrow, Dr. Fuller Plan will be on the consult service.  If things are stable at that point then we will most likely sign  off to be called as needed if brisk bleeding occurs.  If this conservative plan works in the bleeding stops, I would advocate 6 to 8 weeks of twice daily PPI, no aspirin or NSAIDs, and ongoing discussions with family about goals of care/end-of-life and trying to keep him out of the hospital if possible.  Total time 30 minutes, over half spent in discussion with patient's son and composing plan.   Nelida Meuse III Office: [phone]

## 2019-07-04 NOTE — Progress Notes (Addendum)
Patient increasingly agitated/anxious tonight. Given ativan once. The patient pulled his hand-mitts and condom catheter off and pulled out both of his running IV lines. The patient is oriented only to himself and started speaking to people that were not present in the room. IV Team called to place two new IVs.  During rounding, patient had both of his legs over the bed and again pulling at IV lines and attempting to remove the mittens. Ativan given a second time. He has become very defensive/guarded while communicating with the nurse.

## 2019-07-04 NOTE — Evaluation (Signed)
Physical Therapy Evaluation Patient Details Name: Billy [name] MRN: [id number] DOB: April 21, 1927 Today's Date: 07/04/2019   History of Present Illness  Billy [name] is a 85 y.o. male with medical history significant of dementia, TIA, history of bladder and prostate cancer s/p transurethral resection of the bladder, hypertension, and hyperlipidemia who was sent by his SNF for concerns of diarrhea and weakness. Upon EMS arrival, patient was noted to have hematemesis with bright red blood.  Blood pressure was found to be in systolic of 80. Noncontrast CT abdomen showing deflation of stomach, possible inflammation in sigmoid, and thought that diverticulitis might be source of initial sepsis.   Clinical Impression  Pt admitted with above diagnosis. Pt presents with decreased functional mobility secondary to decreased cognition, generalized weakness, and balance impairments. Pt alert once aroused, follows > 75% of simple commands, demonstrates paranoid behaviors. Pt requiring moderate assist for bed mobility; unable to stand. Pt currently with functional limitations due to the deficits listed below (see PT Problem List). Pt will benefit from skilled PT to increase their independence and safety with mobility to allow discharge to the venue listed below.       Follow Up Recommendations SNF;Supervision/Assistance - 24 hour    Equipment Recommendations  None recommended by PT    Recommendations for Other Services       Precautions / Restrictions Precautions Precautions: Fall;Other (comment) Precaution Comments: flexiseal Restrictions Weight Bearing Restrictions: No      Mobility  Bed Mobility Overal bed mobility: Needs Assistance Bed Mobility: Supine to Sit     Supine to sit: Mod assist     General bed mobility comments: Able to progress BLE's off bed; modA for trunk elevation up to sitting  Transfers Overall transfer level: Needs assistance Equipment used: None Transfers: Sit  to/from Stand Sit to Stand: Max assist;+2 physical assistance         General transfer comment: Pt only achieving hip clearance with maxA + 2; unable to stand upright.  Ambulation/Gait             General Gait Details: unable  Stairs            Wheelchair Mobility    Modified Rankin (Stroke Patients Only)       Balance Overall balance assessment: Needs assistance Sitting-balance support: Feet supported Sitting balance-Leahy Scale: Fair                                       Pertinent Vitals/Pain Pain Assessment: Faces Faces Pain Scale: Hurts a little bit Pain Location: "my brain." Pain Descriptors / Indicators: Other (Comment)(unable to verbalize) Pain Intervention(s): Monitored during session    Home Living Family/patient expects to be discharged to:: Assisted living                      Prior Function Level of Independence: Needs assistance         Comments: Pt unable to provide; states he is independent with ADL's and mobility. He is a poor historian and I question the reliability of this information     Hand Dominance        Extremity/Trunk Assessment   Upper Extremity Assessment Upper Extremity Assessment: Generalized weakness    Lower Extremity Assessment Lower Extremity Assessment: Generalized weakness       Communication   Communication: No difficulties  Cognition Arousal/Alertness: Awake/alert Behavior During Therapy: WFL for tasks  assessed/performed Overall Cognitive Status: History of cognitive impairments - at baseline                                 General Comments: History of advanced dementia. Pt oriented to self only; mildly agitated with line of questioning but not combative. States he lives with his husband named Billy [name] and he is coming to get him immediately. Pt also asking [place] to check if "the police are still out there." When told he was at [facility], pt stating, "I knew  that." When tech introduced herself to pt, pt states, "I  knew that." Following simple commands with increased time and multimodal cueing.      General Comments      Exercises     Assessment/Plan    PT Assessment Patient needs continued PT services  PT Problem List Decreased strength;Decreased activity tolerance;Decreased balance;Decreased mobility;Decreased cognition;Decreased safety awareness;Pain       PT Treatment Interventions DME instruction;Gait training;Functional mobility training;Therapeutic activities;Therapeutic exercise;Balance training;Patient/family education    PT Goals (Current goals can be found in the Care Plan section)  Acute Rehab PT Goals Patient Stated Goal: "get out of here." PT Goal Formulation: With patient Time For Goal Achievement: 07/18/19 Potential to Achieve Goals: Fair    Frequency Min 2X/week   Barriers to discharge        Co-evaluation               AM-PAC PT "6 Clicks" Mobility  Outcome Measure Help needed turning from your back to your side while in a flat bed without using bedrails?: A Little Help needed moving from lying on your back to sitting on the side of a flat bed without using bedrails?: A Lot Help needed moving to and from a bed to a chair (including a wheelchair)?: Total Help needed standing up from a chair using your arms (e.g., wheelchair or bedside chair)?: Total Help needed to walk in hospital room?: Total Help needed climbing 3-5 steps with a railing? : Total 6 Click Score: 9    End of Session Equipment Utilized During Treatment: Gait belt Activity Tolerance: Patient tolerated treatment well Patient left: in bed;with call bell/phone within reach;with bed alarm set Nurse Communication: Mobility status PT Visit Diagnosis: Other abnormalities of gait and mobility (R26.89);Muscle weakness (generalized) (M62.81);Difficulty in walking, not elsewhere classified (R26.2)    Time: [id number] PT Time Calculation (min)  (ACUTE ONLY): 15 min   Charges:   PT Evaluation $PT Eval Moderate Complexity: 1 Mod          Ellamae Sia, [place], DPT Acute Rehabilitation Services Pager [phone] Office [phone]   Willy Eddy 07/04/2019, 2:08 PM

## 2019-07-04 NOTE — Progress Notes (Signed)
CRITICAL VALUE ALERT  Critical Value: K+ 2.6  Date & Time Notied: 07/04/2019 1202  Provider Notified: Dr. Verlon Au  Orders Received/Actions taken: Potassium IV every 1hr x 4

## 2019-07-05 DIAGNOSIS — K922 Gastrointestinal hemorrhage, unspecified: Secondary | ICD-10-CM

## 2019-07-05 LAB — CBC WITH DIFFERENTIAL/PLATELET
Abs Immature Granulocytes: 0.22 10*3/uL — ABNORMAL HIGH (ref 0.00–0.07)
Basophils Absolute: 0.1 10*3/uL (ref 0.0–0.1)
Basophils Relative: 1 %
Eosinophils Absolute: 0.4 10*3/uL (ref 0.0–0.5)
Eosinophils Relative: 5 %
HCT: 25.9 % — ABNORMAL LOW (ref 39.0–52.0)
Hemoglobin: 8.7 g/dL — ABNORMAL LOW (ref 13.0–17.0)
Immature Granulocytes: 3 %
Lymphocytes Relative: 18 %
Lymphs Abs: 1.3 10*3/uL (ref 0.7–4.0)
MCH: 29.9 pg (ref 26.0–34.0)
MCHC: 33.6 g/dL (ref 30.0–36.0)
MCV: 89 fL (ref 80.0–100.0)
Monocytes Absolute: 0.7 10*3/uL (ref 0.1–1.0)
Monocytes Relative: 9 %
Neutro Abs: 4.5 10*3/uL (ref 1.7–7.7)
Neutrophils Relative %: 64 %
Platelets: 213 10*3/uL (ref 150–400)
RBC: 2.91 MIL/uL — ABNORMAL LOW (ref 4.22–5.81)
RDW: 16 % — ABNORMAL HIGH (ref 11.5–15.5)
WBC: 7.1 10*3/uL (ref 4.0–10.5)
nRBC: 0.4 % — ABNORMAL HIGH (ref 0.0–0.2)

## 2019-07-05 LAB — CULTURE, BLOOD (ROUTINE X 2): Culture: NO GROWTH

## 2019-07-05 LAB — GLUCOSE, CAPILLARY: Glucose-Capillary: 138 mg/dL — ABNORMAL HIGH (ref 70–99)

## 2019-07-05 MED ORDER — ADULT MULTIVITAMIN W/MINERALS CH
1.0000 | ORAL_TABLET | Freq: Every day | ORAL | Status: DC
  Administered 2019-07-05 – 2019-07-07 (×3): 1 via ORAL
  Filled 2019-07-05 (×3): qty 1

## 2019-07-05 MED ORDER — INFLUENZA VAC A&B SA ADJ QUAD 0.5 ML IM PRSY
0.5000 mL | PREFILLED_SYRINGE | INTRAMUSCULAR | Status: AC
  Administered 2019-07-06: 0.5 mL via INTRAMUSCULAR
  Filled 2019-07-05: qty 0.5

## 2019-07-05 MED ORDER — AMOXICILLIN-POT CLAVULANATE 875-125 MG PO TABS
1.0000 | ORAL_TABLET | Freq: Two times a day (BID) | ORAL | Status: DC
  Administered 2019-07-05 – 2019-07-07 (×5): 1 via ORAL
  Filled 2019-07-05 (×7): qty 1

## 2019-07-05 MED ORDER — PANTOPRAZOLE SODIUM 40 MG PO TBEC
40.0000 mg | DELAYED_RELEASE_TABLET | Freq: Two times a day (BID) | ORAL | Status: DC
  Administered 2019-07-06 – 2019-07-07 (×2): 40 mg via ORAL
  Filled 2019-07-05 (×3): qty 1

## 2019-07-05 MED ORDER — POTASSIUM CHLORIDE 10 MEQ/100ML IV SOLN
10.0000 meq | INTRAVENOUS | Status: AC
  Administered 2019-07-05 (×5): 10 meq via INTRAVENOUS
  Filled 2019-07-05 (×5): qty 100

## 2019-07-05 MED ORDER — ORAL CARE MOUTH RINSE
15.0000 mL | Freq: Two times a day (BID) | OROMUCOSAL | Status: DC
  Administered 2019-07-05 – 2019-07-07 (×5): 15 mL via OROMUCOSAL

## 2019-07-05 NOTE — Progress Notes (Addendum)
Daily Rounding Note  07/05/2019, 12:38 PM  LOS: 5 days   SUBJECTIVE:   Chief complaint: Reported hematemesis PTA. Anemia. FOBT positive dark stools Patient feels well. No complaints. No abdominal pain, no nausea. Tolerating clear liquid diet. 200 mL of stool recorded collected into the Flexi-Seal with one additional unmeasured stool today  OBJECTIVE:         Vital signs in last 24 hours:    Temp:  [97.6 F (36.4 C)-98.1 F (36.7 C)] 98.1 F (36.7 C) (11/23 0522) Pulse Rate:  [80-91] 90 (11/23 0522) Resp:  [16-18] 18 (11/23 0522) BP: (124-130)/(65-79) 130/70 (11/23 0522) SpO2:  [98 %-99 %] 98 % (11/23 0522) Last BM Date: 07/04/19 Filed Weights   06/30/19 2006  Weight: 86.2 kg   General: Pleasant, pale, aged somewhat frail, comfortable Heart: RRR Chest: Clear bilaterally. No labored breathing or cough. Abdomen: Not tender. Soft. No distention. Active bowel sounds. There is dark brown, liquid stool in the Flexi-Seal seal tubing but not in the bag. The stool looks more bilious in its coloration then bloody. There is no burgundy or red tint to the stool any longer. Neuro/Psych: Does not, cooperative, appropriate. Follows commands. Answers all questions.  Intake/Output from previous day: 11/22 0701 - 11/23 0700 In: 3604.8 [P.O.:60; I.V.:2884.7; IV Piggyback:660.1] Out: [id number] [Urine:1325; Stool:200]  Intake/Output this shift: No intake/output data recorded.  Lab Results: Recent Labs    07/03/19 1759 07/04/19 0206 07/05/19 1210  WBC 11.3* 10.3 7.1  HGB 8.6* 8.5* 8.7*  HCT 26.3* 25.8* 25.9*  PLT 233 227 213   BMET Recent Labs    07/03/19 0215 07/04/19 1110  NA 146* 146*  K 2.8* 2.6*  CL 119* 119*  CO2 20* 21*  GLUCOSE 94 116*  BUN 38* 20  CREATININE 0.79 0.61  CALCIUM 8.4* 8.1*   LFT Recent Labs    07/03/19 0215 07/04/19 1110  ALBUMIN 2.0* 2.1*   PT/INR No results for input(s): LABPROT, INR  in the last 72 hours. Hepatitis Panel No results for input(s): HEPBSAG, HCVAB, HEPAIGM, HEPBIGM in the last 72 hours.  Studies/Results: No results found.   Scheduled Meds: . amoxicillin-clavulanate  1 tablet Oral Q12H  . feeding supplement  1 Container Oral TID BM  . [START ON 07/06/2019] influenza vaccine adjuvanted  0.5 mL Intramuscular Tomorrow-1000  . mouth rinse  15 mL Mouth Rinse BID  . memantine  10 mg Oral QHS  . metoprolol tartrate  12.5 mg Oral Daily  . multivitamin with minerals  1 tablet Oral Daily  . risperiDONE  0.25 mg Oral QPM  . tamsulosin  0.4 mg Oral QPC supper  . traZODone  25 mg Oral QHS   Continuous Infusions: . dextrose 5 % and 0.9 % NaCl with KCl 40 mEq/L 75 mL/hr at 07/05/19 0700  . pantoprozole (PROTONIX) infusion 8 mg/hr (07/05/19 0700)  . potassium chloride 10 mEq (07/05/19 1141)   PRN Meds:.LORazepam, ondansetron (ZOFRAN) IV   ASSESMENT:   *   Hematemesis reported at SNF PTA.  None witnessed since hospitalization. Melena, loose black stool. Suspect ulcer from chronic aspirin, this is currently discontinued. Due to comorbidities, patient at high risk for sedation and potential endoscopy related complications. Patient family, his son Billy [name], hoping to avoid EGD, prefers conservative route. 72 hour IV Protonix infusion finishes tomorrow at 11 AM.  Was on no PPI or acid suppressive medication PTA.  *    Inflammatory changes in sigmoid colon seen  on 07/03/2019 non contrast CT abdomen without pelvis.  No plans to investigate this.    *     Normocytic anemia, stable since initial decline within first 48 hours. Has not required transfusion  *     Suspected aspiration event PTA.  On Unasyn >> now Augmentin.  Probable small pleural effusions and mild bibasilar atelectasis on x-ray 11/20.Marland Kitchen  PLAN   *   After Protonix infusion finishes tomorrow, start Protonix 40 mg po BID. alternatively we could probably discontinue the infusion when the current bag  finishes today and then start the oral bid regimen this evening  *   Advance diet.  Diet soft.    *   Duration of abx?  *    Flexi-Seal can be removed at nursing discretion but would not leave it in place for much longer as it does not seem necessary and Flexi-Seals can cause pressure injury/ulcers in the rectum if left in for prolonged length of time   Azucena Freed  07/05/2019, 12:38 PM Phone [phone]    Attending Physician Note   I have taken an interval history, reviewed the chart and examined the patient. I agree with the Advanced Practitioner's note, impression and recommendations.   Resolved UGI bleed, ulcer suspected. EGD not performed d/t comorbidities, family preference  ABL anemia, stable Abnormal CT of sigmoid colon, as with UGI bleed, no further evaluation planned   Advance diet Pantoprazole 40 mg po bid for 1 month then qd long term for suspected ulcer Wait at least 1 more week to resume ASA as medically indicated  GI signing off Outpatient GI follow up only needed if family and PCP desire which would be with Dr. Erlene Senters, MD [facility]

## 2019-07-05 NOTE — Progress Notes (Signed)
Hospitalist progress note  If 7PM-7AM,  night-coverage-look on AMION -prefer pages-not epic chat,please  Billy [name] [id number] DOB: [birth year] DOA: 06/30/2019  PCP: Reymundo Poll, MD   Narrative:  56 skilled nursing facility year-old white male HTN, overactive bladder, TIA 12/2017, HLD,severe dementia, bladder cancer prostate cancer and obstructive uropathy status post surgery  Admit 06/30/2019 [possible hematemesis BRB-initially hypotensive 80s over 50s after 3 L fluid bolus WBC 18-->22 hemoglobin 12 and stable BUN/creatinine 29/1.2 >baseline 0.8 CT abdomen pelvis? Bezoar vs clot clot and large stool burden Started on Protonix GTT Developed during hospital stay further dark stool and flexiseal was placed in addition  Assessment & Plan: Hypovolemic shock-cause unclear resus with 3 l in ED Hemoglobin dropped form 11--> 8.6 but has stabilized in the 8 range Ct on admit showed Bezoar vs clot--- repeat scan 1122 inflammatory changes Continue Protonix drip-- Unasyn changed to po augmentin cdiff is negative appreciate GI input -patient is not a good candidate for scope  AKI with metabolic acidosis Continue IVF--rpt labs in am Hypokalemia Change IVF 11/21 to d5 + K with 40 of K labs in am -rpt mag in am Leukocytosis?  Pneumonia vs diarrheal illness Possibility of aspiration therefore covering with Unasyn monotherapy-repeat CXRs 11/21 was improved Prior TIA For now holding aspirin until clear no actual bleed HTN shock on admission, transition from IV to home po metoprolol 12.5 daily HLD Holding statin at this time Bladder/prostate cancer status post TURP  SCD, son being updated daily  Subjective:  Fair asking for apple juice  Consultants:   GI Dr. Loletha Carrow seeing patient CT scan on admission Procedures:   CT scan on admission  CT scan 11/21 showing colitis Antimicrobials:   Narrowed to Unasyn  Data Reviewed: I have personally reviewed following labs and imaging  studies  White count up from 18.6-22 --->12.6-->10 hemoglobin down from 12-11.0-->8.6-->8.5-->8.7 Lactic acid cycled from 4-3 0.8-5.0  Labs pending  Objective: Vitals:   07/04/19 0730 07/04/19 1338 07/04/19 2152 07/05/19 0522  BP: 132/73 128/65 124/79 130/70  Pulse: 82 80 91 90  Resp: 17 16 18 18   Temp: 98.3 F (36.8 C) 97.6 F (36.4 C) 97.8 F (36.6 C) 98.1 F (36.7 C)  TempSrc: Oral Oral Oral Oral  SpO2: 99% 99% 98% 98%  Weight:      Height:        Intake/Output Summary (Last 24 hours) at 07/05/2019 1247 Last data filed at 07/05/2019 0700 Gross per 24 hour  Intake 3604.82 ml  Output 775 ml  Net 2829.82 ml   Filed Weights   06/30/19 2006  Weight: 86.2 kg    Examination:  More coherent No distress eomi ncat  s1 s2 no m/r/g cta b no added sound abd soft nt nd no rebound   Scheduled Meds: . amoxicillin-clavulanate  1 tablet Oral Q12H  . feeding supplement  1 Container Oral TID BM  . [START ON 07/06/2019] influenza vaccine adjuvanted  0.5 mL Intramuscular Tomorrow-1000  . mouth rinse  15 mL Mouth Rinse BID  . memantine  10 mg Oral QHS  . metoprolol tartrate  12.5 mg Oral Daily  . multivitamin with minerals  1 tablet Oral Daily  . risperiDONE  0.25 mg Oral QPM  . tamsulosin  0.4 mg Oral QPC supper  . traZODone  25 mg Oral QHS   Continuous Infusions: . dextrose 5 % and 0.9 % NaCl with KCl 40 mEq/L 75 mL/hr at 07/05/19 0700  . pantoprozole (PROTONIX) infusion 8 mg/hr (07/05/19 0700)  .  potassium chloride 10 mEq (07/05/19 1141)    Radiology Studies: Reviewed images personally in health database    LOS: 5 days   Time spent: [place], MD [facility]  07/05/2019, 12:47 PM

## 2019-07-05 NOTE — TOC Progression Note (Addendum)
Transition of Care [facility]) - Progression Note    Patient Details  Name: Billy [name] MRN: [id number] Date of Birth: [birth year]  [facility] of [facility]) CM/SW Contact  Sharin Mons, RN Phone Number: 07/05/2019, 2:12 PM  Clinical Narrative:    NCM received consult for possible SNF placement at time of discharge. NCM spoke with patient's son [name] regarding PT recommendation of SNF placement at time of discharge. [name] expressed understanding of PT recommendation and states family is agreeable to SNF placement for dad at the time of discharge. [name] reports no preference for SNF. NCM discussed insurance authorization process and provided Medicare SNF ratings list via email, [email or web address]. No further questions reported at this time. NCM to continue to follow and assist with discharge planning needs.  Bert(son) ,[phone]  Expected discharge plan: SNF Barriers to Discharge: Continued Medical Work up  Expected Discharge Plan and Services    Social Determinants of Health (SDOH) Interventions    Readmission Risk Interventions No flowsheet data found.

## 2019-07-05 NOTE — NC FL2 (Addendum)
[place] MEDICAID FL2 LEVEL OF CARE SCREENING TOOL     IDENTIFICATION  Patient Name: Billy [name] Birthdate: 1927/03/07 Sex: male Admission Date (Current Location): 06/30/2019  [facility] and [place] Number:  Herbalist and Address:  [facility], [place] [address] [place], [place] [address]      Provider Number: [id number]  Attending Physician Name and Address:  Nita Sells, MD  Relative Name and Phone Number:  Beryle Loiseau (son) [phone]    Current Level of Care: Hospital Recommended Level of Care: [place] Prior Approval Number:    Date Approved/Denied:   PASRR Number: [id number]  Discharge Plan: SNF    Current Diagnoses: Patient Active Problem List   Diagnosis Date Noted  . Upper GI bleed 06/30/2019  . Constipation 06/30/2019  . AKI (acute kidney injury) ([place]) 06/30/2019  . Hypotension 06/30/2019  . TIA (transient ischemic attack) 12/23/2017  . Stroke-like symptoms 12/22/2017  . Overactive bladder   . Hypertension   . Hypercholesterolemia   . Dementia ([place])   . 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[place])     Orientation RESPIRATION BLADDER Height & Weight     Self  Normal Incontinent Weight: 86.2 kg Height:  6' (182.9 cm)  BEHAVIORAL SYMPTOMS/MOOD NEUROLOGICAL BOWEL NUTRITION STATUS      Incontinent Diet(refer to d/c summary)  AMBULATORY STATUS COMMUNICATION OF NEEDS Skin   Extensive Assist Verbally Normal                       Personal Care Assistance Level of Assistance  Bathing, Feeding, Dressing Bathing Assistance: Maximum assistance Feeding assistance: Limited assistance Dressing Assistance: Maximum assistance     Functional Limitations Info  Sight, Hearing, Speech Sight Info: Adequate Hearing Info: Adequate Speech Info: Adequate    SPECIAL CARE FACTORS FREQUENCY   PT  OT  5x 3x                  Contractures Contractures Info: Not present    Additional Factors Info  Code Status,  Allergies, Isolation Precautions Code Status Info: DNR Allergies Info: NKDA           Current Medications (07/05/2019):  This is the current hospital active medication list Current Facility-Administered Medications  Medication Dose Route Frequency Provider Last Rate Last Dose  . amoxicillin-clavulanate (AUGMENTIN) 875-125 MG per tablet 1 tablet  1 tablet Oral Q12H Samtani, Jai-Gurmukh, MD      . dextrose 5 % and 0.9 % NaCl with KCl 40 mEq/L infusion   Intravenous Continuous Nita Sells, MD 75 mL/hr at 07/05/19 0700    . feeding supplement (BOOST / RESOURCE BREEZE) liquid 1 Container  1 Container Oral TID BM Doran Stabler, MD   1 Container at 07/05/19 1029  . [START ON 07/06/2019] influenza vaccine adjuvanted (FLUAD) injection 0.5 mL  0.5 mL Intramuscular Tomorrow-1000 Samtani, Jai-Gurmukh, MD      . LORazepam (ATIVAN) injection 0.5 mg  0.5 mg Intravenous Q4H PRN Nita Sells, MD   0.5 mg at 07/04/19 0325  . MEDLINE mouth rinse  15 mL Mouth Rinse BID Nita Sells, MD   15 mL at 07/05/19 1029  . memantine (NAMENDA) tablet 10 mg  10 mg Oral QHS Tu, Ching T, DO   10 mg at 07/04/19 2235  . metoprolol tartrate (LOPRESSOR) tablet 12.5 mg  12.5 mg Oral Daily Nita Sells, MD   12.5 mg at 07/05/19 1028  . multivitamin with minerals tablet 1 tablet  1 tablet Oral Daily Nita Sells, MD   1 tablet at 07/05/19 1255  . ondansetron (ZOFRAN) injection 4 mg  4 mg Intravenous Q6H PRN Tu, Ching T, DO      . pantoprazole (PROTONIX) 80 mg in sodium chloride 0.9 % 250 mL (0.32 mg/mL) infusion  8 mg/hr Intravenous Continuous Nelida Meuse III, MD 25 mL/hr at 07/05/19 0700 8 mg/hr at 07/05/19 0700  . [START ON 07/06/2019] pantoprazole (PROTONIX) EC tablet 40 mg  40 mg Oral BID Vena Rua, PA-C      . potassium chloride 10 mEq in 100 mL IVPB  10 mEq Intravenous Q1 Hr x 5 Samtani, Jai-Gurmukh, MD 100 mL/hr at 07/05/19 1405 10 mEq at 07/05/19 1405  . risperiDONE  (RISPERDAL) tablet 0.25 mg  0.25 mg Oral QPM Tu, Ching T, DO   0.25 mg at 07/04/19 1635  . tamsulosin (FLOMAX) capsule 0.4 mg  0.4 mg Oral QPC supper Tu, Ching T, DO   0.4 mg at 07/04/19 1636  . traZODone (DESYREL) tablet 25 mg  25 mg Oral QHS Tu, Ching T, DO   25 mg at 07/04/19 2234     Discharge Medications: Please see discharge summary for a list of discharge medications.  Relevant Imaging Results:  Relevant Lab Results:   Additional Information  SSN: [id number], [place]

## 2019-07-05 NOTE — Progress Notes (Signed)
Initial Nutrition Assessment  DOCUMENTATION CODES:   Not applicable  INTERVENTION:   -Continue Boost Breeze po TID, each supplement provides 250 kcal and 9 grams of protein -MVI with minerals daily -RD will follow for diet advance and adjust supplement regimen as appropriate and based upon goals of care  NUTRITION DIAGNOSIS:   Inadequate oral intake related to altered GI function as evidenced by meal completion < 25%.  GOAL:   Patient will meet greater than or equal to 90% of their needs  MONITOR:   PO intake, Supplement acceptance, Diet advancement, Labs, Weight trends, Skin, I & O's  REASON FOR ASSESSMENT:   Malnutrition Screening Tool    ASSESSMENT:   Billy [name] is a 85 y.o. male with medical history significant of dementia, TIA, history of bladder and prostate cancer s/p transurethral resection of the bladder, hypertension, and hyperlipidemia who was sent by his SNF for concerns of diarrhea and weakness for an unknown period of time.  Upon EMS arrival, patient was noted to have hematemesis with bright red blood.  Blood pressure was found to be in systolic of 80.  Patient is unable to provide history given his dementia and son at bedside is unable to provide much history as he has been given minimal updates from the nursing facility.  Patient only complaining of intermittent ower abdominal pain.  Son denies patient having previous history of GI bleeding.  Denies that patient could be getting NSAIDs at nursing facility.  Pt admitted with upper GIB.   11/21- rectal tube placed  Reviewed I/O's: +2.1 L x 24 hours and +5.9 L since admission  UOP: 1.3 L x 24 hours  Pt lying in bed at time of visit; did not respond to voice or touch. Per chart review, pt was agitated last night and requiring restraints.  Pt with minimal intake; PO: 0% per meal completion records.   [facility] MAR [facility]. Pt on a regular diet PTA.   Reviewed wt hx; noted wt gain over the  past 2-3 months, which may partially be due to edema. UBW ranging from 195-204#.   Per MD notes, pt has been in declining health over the past year. He is at high risk for procedures, such as endoscopy. Per GI notes, plan to check hemoglobin and hematocrit and treat conservatively. MD plans to continue goals of care discussions with pt and family.   Medications reviewed and include dextrose 5% and NaCl 40 mEq/L infusion @ 75 ml/hr.   Labs reviewed: Na: 146, K: 2.6 (on IV supplementation).   NUTRITION - FOCUSED PHYSICAL EXAM:    Most Recent Value  Orbital Region  Mild depletion  Upper Arm Region  Mild depletion  Thoracic and Lumbar Region  No depletion  Buccal Region  No depletion  Temple Region  Mild depletion  Clavicle Bone Region  Mild depletion  Clavicle and Acromion Bone Region  Mild depletion  Scapular Bone Region  Mild depletion  Dorsal Hand  Unable to assess  Patellar Region  No depletion  Anterior Thigh Region  No depletion  Posterior Calf Region  No depletion  Edema (RD Assessment)  Mild  Hair  Reviewed  Eyes  Reviewed  Mouth  Reviewed  Skin  Reviewed  Nails  Reviewed       Diet Order:   Diet Order            Diet clear liquid Room service appropriate? Yes; Fluid consistency: Thin  Diet effective now  EDUCATION NEEDS:   No education needs have been identified at this time  Skin:  Skin Assessment: Skin Integrity Issues: Skin Integrity Issues:: Other (Comment) Other: rt hip abrasion  Last BM:  07/04/19 (via rectal tube)  Height:   Ht Readings from Last 1 Encounters:  06/30/19 6' (1.829 m)    Weight:   Wt Readings from Last 1 Encounters:  06/30/19 86.2 kg    Ideal Body Weight:  83.6 kg  BMI:  Body mass index is 25.77 kg/m.  Estimated Nutritional Needs:   Kcal:  1800-2000  Protein:  90-105 grams  Fluid:  > 1.8 L    Billy [name] A. Jimmye Norman, RD, LDN, [place] Registered Dietitian II Certified Diabetes Care and Education  Specialist Pager: [phone] After hours Pager: [phone]

## 2019-07-06 LAB — BASIC METABOLIC PANEL WITH GFR
Anion gap: 7 (ref 5–15)
BUN: 13 mg/dL (ref 8–23)
CO2: 19 mmol/L — ABNORMAL LOW (ref 22–32)
Calcium: 7.8 mg/dL — ABNORMAL LOW (ref 8.9–10.3)
Chloride: 115 mmol/L — ABNORMAL HIGH (ref 98–111)
Creatinine, Ser: 0.64 mg/dL (ref 0.61–1.24)
GFR calc Af Amer: >60 mL/min
GFR calc non Af Amer: >60 mL/min
Glucose, Bld: 121 mg/dL — ABNORMAL HIGH (ref 70–99)
Potassium: 3.3 mmol/L — ABNORMAL LOW (ref 3.5–5.1)
Sodium: 141 mmol/L (ref 135–145)

## 2019-07-06 LAB — BLOOD GAS, VENOUS
Acid-base deficit: 2.4 mmol/L — ABNORMAL HIGH (ref 0.0–2.0)
Bicarbonate: 20.9 mmol/L (ref 20.0–28.0)
Collection site: 4367
FIO2: 21
O2 Saturation: 85.9 %
Patient temperature: 37
pCO2, Ven: 30.4 mmHg — ABNORMAL LOW (ref 44.0–60.0)
pH, Ven: 7.452 — ABNORMAL HIGH (ref 7.250–7.430)

## 2019-07-06 LAB — CULTURE, BLOOD (ROUTINE X 2)
Culture: NO GROWTH
Special Requests: ADEQUATE

## 2019-07-06 LAB — CBC WITH DIFFERENTIAL/PLATELET
Abs Immature Granulocytes: 0.34 10*3/uL — ABNORMAL HIGH (ref 0.00–0.07)
Basophils Absolute: 0.1 10*3/uL (ref 0.0–0.1)
Basophils Relative: 1 %
Eosinophils Absolute: 0.3 10*3/uL (ref 0.0–0.5)
Eosinophils Relative: 4 %
HCT: 24.6 % — ABNORMAL LOW (ref 39.0–52.0)
Hemoglobin: 8.4 g/dL — ABNORMAL LOW (ref 13.0–17.0)
Immature Granulocytes: 4 %
Lymphocytes Relative: 17 %
Lymphs Abs: 1.5 10*3/uL (ref 0.7–4.0)
MCH: 30 pg (ref 26.0–34.0)
MCHC: 34.1 g/dL (ref 30.0–36.0)
MCV: 87.9 fL (ref 80.0–100.0)
Monocytes Absolute: 0.8 10*3/uL (ref 0.1–1.0)
Monocytes Relative: 9 %
Neutro Abs: 5.8 10*3/uL (ref 1.7–7.7)
Neutrophils Relative %: 65 %
Platelets: 216 10*3/uL (ref 150–400)
RBC: 2.8 MIL/uL — ABNORMAL LOW (ref 4.22–5.81)
RDW: 15.9 % — ABNORMAL HIGH (ref 11.5–15.5)
WBC: 8.9 10*3/uL (ref 4.0–10.5)
nRBC: 0 % (ref 0.0–0.2)

## 2019-07-06 LAB — PHOSPHORUS: Phosphorus: 3.3 mg/dL (ref 2.5–4.6)

## 2019-07-06 LAB — SARS CORONAVIRUS 2 (TAT 6-24 HRS): SARS Coronavirus 2: NEGATIVE

## 2019-07-06 LAB — MAGNESIUM: Magnesium: 1.5 mg/dL — ABNORMAL LOW (ref 1.7–2.4)

## 2019-07-06 MED ORDER — TRAZODONE HCL 50 MG PO TABS: 25.0000 mg | ORAL_TABLET | Freq: Every day | ORAL | 0 refills | Status: AC

## 2019-07-06 MED ORDER — POTASSIUM CHLORIDE CRYS ER 20 MEQ PO TBCR: 40.0000 meq | EXTENDED_RELEASE_TABLET | Freq: Two times a day (BID) | ORAL | 0 refills | Status: DC

## 2019-07-06 MED ORDER — PANTOPRAZOLE SODIUM 40 MG PO TBEC: 40.0000 mg | DELAYED_RELEASE_TABLET | Freq: Two times a day (BID) | ORAL | Status: AC

## 2019-07-06 MED ORDER — RISPERIDONE 0.25 MG PO TABS: 0.2500 mg | ORAL_TABLET | Freq: Every evening | ORAL | 0 refills | Status: DC

## 2019-07-06 MED ORDER — AMOXICILLIN-POT CLAVULANATE 875-125 MG PO TABS: 1.0000 | ORAL_TABLET | Freq: Two times a day (BID) | ORAL | 0 refills | Status: DC

## 2019-07-06 MED ORDER — POTASSIUM CHLORIDE 10 MEQ/100ML IV SOLN
10.0000 meq | INTRAVENOUS | Status: AC
  Administered 2019-07-06 (×5): 10 meq via INTRAVENOUS
  Filled 2019-07-06 (×5): qty 100

## 2019-07-06 MED ORDER — LORAZEPAM 2 MG/ML IJ SOLN
0.5000 mg | Freq: Once | INTRAMUSCULAR | Status: AC
  Administered 2019-07-06: 0.5 mg via INTRAVENOUS
  Filled 2019-07-06: qty 1

## 2019-07-06 MED ORDER — MAGNESIUM SULFATE 2 GM/50ML IV SOLN
2.0000 g | Freq: Once | INTRAVENOUS | Status: AC
  Administered 2019-07-06: 2 g via INTRAVENOUS
  Filled 2019-07-06: qty 50

## 2019-07-06 MED ORDER — MAGNESIUM OXIDE 400 (241.3 MG) MG PO TABS: 400.0000 mg | ORAL_TABLET | Freq: Every day | ORAL | 0 refills | Status: AC

## 2019-07-06 NOTE — Care Management Important Message (Signed)
Important Message  Patient Details  Name: Billy [name] MRN: [id number] Date of Birth: Jan 13, 1927   Medicare Important Message Given:  Yes     Neila Teem Montine Circle 07/06/2019, 2:53 PM

## 2019-07-06 NOTE — Progress Notes (Addendum)
Patient resting in bed, but respirations still 30s-40s. K+ rechecked and resulted at 3.3. Lennox Grumbles, NP notified via text page. Will continue to monitor.

## 2019-07-06 NOTE — Progress Notes (Signed)
NCM received call from [facility] admission  informing NCM that they would like to observe pt one more night 2/2 pt with noted mitts on 11/22 and agitation. Will f/u with NCM in am. Whitman Hero RN,BSN,CM [phone]

## 2019-07-06 NOTE — TOC Progression Note (Addendum)
Transition of Care [facility]) - Progression Note    Patient Details  Name: Billy [name] MRN: [id number] Date of Birth: 1927/02/18  [facility] CM/SW Contact  Sharin Mons, RN Phone Number: [phone] 07/06/2019, 10:47 AM  Clinical Narrative:    Bed offer extended by [facility]. NCM made son [name] aware. [name] accept. Updated COVID pending. Please contact NCM if results presents by 5 pm.  Expected Discharge Plan and Services     Expected Discharge Date: 07/06/19            Social Determinants of Health (SDOH) Interventions    Readmission Risk Interventions No flowsheet data found.

## 2019-07-06 NOTE — Discharge Summary (Signed)
Physician Discharge Summary  Billy [name] [id number] DOB: 01-21-1927 DOA: 06/30/2019  PCP: Reymundo Poll, MD  Admit date: 06/30/2019 Discharge date: 07/06/2019  Time spent: 33 minutes  Recommendations for Outpatient Follow-up:  1. Recommend Augmentin until 11/27 completing 10 days treatment for probable colitis 2. Recommend Chem-12, CBC in about 1 week 3. Note dosage change some medications 4. Will need skilled care  Discharge Diagnoses:  Principal Problem:   Upper GI bleed Active Problems:   Hypercholesterolemia   Dementia (HCC)   TIA (transient ischemic attack)   Constipation   AKI (acute kidney injury) ([place])   Hypotension   Discharge Condition: Improved but overall guarded  Diet recommendation: Soft  Filed Weights   06/30/19 2006  Weight: 86.2 kg    History of present illness:  85 [facility] year[facility] white male HTN, overactive bladder, TIA 12/2017, HLD,severe dementia, bladder cancer prostate cancer and obstructive uropathy status post surgery  Admit 06/30/2019 [possible hematemesis BRB-initially hypotensive 80s over 50s after 3 L fluid bolus WBC 18-->22 hemoglobin 12 and stable BUN/creatinine 29/1.2 >baseline 0.8 CT abdomen pelvis? Bezoar vs clot clot and large stool burden Started on Protonix GTT Developed during hospital stay further dark stool and flexiseal was placed in addition  Hospital Course:  Hypovolemic shock-cause unclear resus with 3 l in ED Hemoglobin dropped form 11--> 8.6 but has stabilized in the 8 range Ct on admit showed Bezoar vs clot--- repeat scan 1122 inflammatory changes Continue Protonix drip-- Unasyn changed to po Augmentin completing on 11/27 ~ 10 days total duration on d/c cdiff is negative appreciate GI input -patient is not a good candidate for scope  AKI with metabolic acidosis Continue IVF--rpt labs showed improvement bun creat 13/0.64 Hypokalemia Change IVF 11/21 to d5 + K with 40 of K labs in am -rpt mag in  am-replacing mag and potassium on discharge with K. Dur 40 twice daily and Mag-Ox 400 daily needs labs in about 1 week none Leukocytosis?  Pneumonia vs diarrheal illness Possibility of aspiration   covering with Unasyn monotherapy-repeat CXRs 11/21 was improved Prior TIA For now holding aspirin until clear no actual bleed--would hold ongoing aspirin in the outpatient setting in addition HTN shock on admission, transition from IV to home po metoprolol 12.5 daily HLD Holding statin at this time Bladder/prostate cancer status post TURP  Procedures:  None   Consultations:  GI  Discharge Exam: Vitals:   07/06/19 0550 07/06/19 0818  BP:  119/80  Pulse:  80  Resp: (!) 26 18  Temp: (!) 97.5 F (36.4 C) (!) 97.5 F (36.4 C)  SpO2: 96% 96%    General: Awake alert coherent a little fatigued but no distress EOMI NCAT Cardiovascular: S1-S2 no murmur rub or gallop Respiratory: Clinically clear no rales no rhonchi slight wheeze No lower extremity edema No abdominal discomfort no tenderness ROM intact  Discharge Instructions   Discharge Instructions    Diet - low sodium heart healthy   Complete by: As directed    Increase activity slowly   Complete by: As directed      Allergies as of 07/06/2019   No Known Allergies     Medication List    STOP taking these medications   phenazopyridine 200 MG tablet Commonly known as: Pyridium     TAKE these medications   acetaminophen 650 MG CR tablet Commonly known as: TYLENOL Take 650 mg by mouth daily.   amoxicillin-clavulanate 875-125 MG tablet Commonly known as: AUGMENTIN Take 1 tablet by mouth every  12 (twelve) hours.   aspirin 81 MG EC tablet Take 1 tablet (81 mg total) by mouth daily.   CENTRUM SILVER 50+MEN PO Take 1 tablet by mouth daily.   PRESERVISION AREDS 2+MULTI VIT PO Take 1 capsule by mouth 2 (two) times daily.   guaiFENesin 600 MG 12 hr tablet Commonly known as: MUCINEX Take 600 mg by mouth 2 (two)  times daily as needed for cough (and congestion).   memantine 10 MG tablet Commonly known as: NAMENDA Take 10 mg by mouth at bedtime.   metoprolol tartrate 25 MG tablet Commonly known as: LOPRESSOR Take 12.5 mg by mouth daily.   pantoprazole 40 MG tablet Commonly known as: PROTONIX Take 1 tablet (40 mg total) by mouth 2 (two) times daily.   risperiDONE 0.25 MG tablet Commonly known as: RISPERDAL Take 1 tablet (0.25 mg total) by mouth every evening. Take 0.25 mg by mouth daily at 4:30 PM and an additional 0.125 mg once daily as needed for anxiety, hallucinations, or agitation   tamsulosin 0.4 MG Caps capsule Commonly known as: FLOMAX Take 0.4 mg by mouth daily after supper.   traZODone 50 MG tablet Commonly known as: DESYREL Take 0.5 tablets (25 mg total) by mouth at bedtime.      No Known Allergies    The results of significant diagnostics from this hospitalization (including imaging, microbiology, ancillary and laboratory) are listed below for reference.    Significant Diagnostic Studies: Ct Abdomen Wo Contrast  Result Date: 07/03/2019 CLINICAL DATA:  85 year old male with GI bleeding and gastric outlet obstruction EXAM: CT ABDOMEN WITHOUT CONTRAST TECHNIQUE: Multidetector CT imaging of the abdomen was performed following the standard protocol without IV contrast. COMPARISON:  Prior CT scan of the abdomen and pelvis 06/30/2019 FINDINGS: Lower chest: The heart is normal in size. No pericardial effusion. Extensive calcifications in the native coronary arteries. Calcification of the mitral valve annulus. Small bilateral pleural effusions and associated bibasilar atelectasis. Hepatobiliary: High attenuation material layers within the gallbladder lumen. Normal hepatic contour and morphology. No evidence of biliary ductal dilatation or hepatic lesion. Pancreas: Unremarkable. No pancreatic ductal dilatation or surrounding inflammatory changes. Spleen: Normal in size without focal  abnormality. Adrenals/Urinary Tract: Adrenal glands are normal. No evidence of hydronephrosis or nephrolithiasis. The visualized ureters are unremarkable. Stomach/Bowel: Incompletely imaged focal bowel wall thickening involving the sigmoid colon in the low mid abdomen at the level of the umbilicus. There is associated inflammatory stranding in the adjacent mesentery. Vascular/Lymphatic: Limited evaluation in the absence of intravenous contrast. Extensive atherosclerotic vascular calcifications. Other: No evidence of ascites.  No abdominal wall hernia. Musculoskeletal: No acute fracture or aggressive appearing lytic or blastic osseous lesion. IMPRESSION: 1. Partially imaged inflammatory changes involving the sigmoid colon in the low midline abdomen at the level of the umbilicus. The entire colon was not included as the pelvis was not imaged on this CT scan of the abdomen only. Differential considerations include diverticulitis as well as infectious, inflammatory and ischemic colitis. 2. The stomach is decompressed. No imaging findings to suggest gastric outlet obstruction at this time. 3. Additional ancillary findings as above without significant interval change compared to recent prior imaging. Electronically Signed   By: Jacqulynn Cadet M.D.   On: 07/03/2019 12:02   Dg Chest 2 View  Result Date: 07/02/2019 CLINICAL DATA:  Shortness of breath, cough. EXAM: CHEST - 2 VIEW COMPARISON:  March 18 2019. FINDINGS: Stable cardiomediastinal silhouette. Atherosclerosis of thoracic aorta is noted no pneumothorax is noted. Mild bibasilar subsegmental  atelectasis is noted with small bilateral pleural effusions. Bony thorax is unremarkable. IMPRESSION: Mild bibasilar subsegmental atelectasis is noted with probable small pleural effusions. Aortic Atherosclerosis (ICD10-I70.0). Electronically Signed   By: Marijo Conception M.D.   On: 07/02/2019 11:24   Dg Abd 1 View  Result Date: 07/02/2019 CLINICAL DATA:  Partial  gastric outlet obstruction. EXAM: ABDOMEN - 1 VIEW COMPARISON:  June 30, 2019. FINDINGS: The bowel gas pattern is normal. No radio-opaque calculi or other significant radiographic abnormality are seen. IMPRESSION: No evidence of bowel obstruction or ileus. Electronically Signed   By: Marijo Conception M.D.   On: 07/02/2019 11:25   Ct Abdomen Pelvis W Contrast  Result Date: 06/30/2019 CLINICAL DATA:  Hematemesis. Generalized weakness and diarrhea. EXAM: CT ABDOMEN AND PELVIS WITH CONTRAST TECHNIQUE: Multidetector CT imaging of the abdomen and pelvis was performed using the standard protocol following bolus administration of intravenous contrast. CONTRAST:  13mL OMNIPAQUE IOHEXOL 300 MG/ML  SOLN COMPARISON:  Abdominal radiographs dated 06/30/2019 FINDINGS: Lower chest: Elevation of the left hemidiaphragm. Bronchiectasis at the lung bases with slight accentuation of the interstitial markings and slight tree in bud densities at the right lung base. Slight atelectasis at the left lung base with pleural calcification at the left lung base posteriorly. No discrete effusion. Hepatobiliary: Liver parenchyma is normal. Dependent subtle densities in the gallbladder probably represent tiny stones. The gallbladder is not distended. No gallbladder wall thickening. Biliary tree is normal. Pancreas: Unremarkable. No pancreatic ductal dilatation or surrounding inflammatory changes. Spleen: Normal in size without focal abnormality. Adrenals/Urinary Tract: Adrenal glands are normal. No significant abnormality of the kidneys. No hydronephrosis. Bladder is normal. Stomach/Bowel: The stomach is distended with fluid. There is a slightly higher density material in the stomach which may represent ingested food or a bezoar or clot. The duodenum and small bowel are normal. Appendix is normal. Large amount of stool in the rectum. Moderate stool in the transverse and descending portions of the colon. Vascular/Lymphatic: Aortic  atherosclerosis. No enlarged abdominal or pelvic lymph nodes. Reproductive: There is a focal area of enhancement at the base of the bladder to the left of midline measuring 12 x 20 x 7 mm. The patient has a history of resection of a bladder tumor as well as resection of the prostate gland. Other: No abdominal wall hernia or abnormality. No abdominopelvic ascites. Musculoskeletal: No acute or significant osseous findings. IMPRESSION: 1. Distended stomach filled with fluid and high density material which could represent a bezoar or clot. 2. Focal area of enhancement at the base of the bladder to the left of midline consistent with the patient's history of bladder tumor. This could represent scarring or tumor. 3. Large amount of stool in the rectum. 4. Probable tiny gallstones. 5. Elevation of the left hemidiaphragm. 6. Bronchiectasis at the lung bases with slight accentuation of the interstitial markings and tree in bud densities at the right lung base. 7. Aortic Atherosclerosis (ICD10-I70.0).a Electronically Signed   By: Lorriane Shire M.D.   On: 06/30/2019 21:52   Dg Abdomen Acute W/chest  Result Date: 06/30/2019 CLINICAL DATA:  GI bleed.  Concern for perforation. EXAM: DG ABDOMEN ACUTE W/ 1V CHEST COMPARISON:  03/18/2019 chest radiograph. FINDINGS: Stable cardiomediastinal silhouette with top-normal heart size. No pneumothorax. No pleural effusion. No pulmonary edema. Low lung volumes. Mild bibasilar scarring versus atelectasis. Questionable lucency under the left hemidiaphragm on the erect portable chest radiograph. No evidence of pneumatosis or pneumoperitoneum on the left lateral decubitus views. Relatively gasless  abdomen with no dilated small bowel loops. Fluid levels scattered in the large bowel. No radiopaque nephrolithiasis. Degenerative changes in the lumbar spine. IMPRESSION: 1. Questionable lucency under the left hemidiaphragm on the upright portable chest radiograph, although with no evidence of  pneumatosis or pneumoperitoneum on the left lateral decubitus views. If there is continued clinical concern for free air, dedicated CT of the abdomen and pelvis with oral and IV contrast would be recommended for further evaluation. 2. Low lung volumes with mild bibasilar scarring versus atelectasis. 3. Relatively gasless abdomen with no specific findings of bowel obstruction. Scattered fluid levels in the large bowel. These results were called by telephone at the time of interpretation on 06/30/2019 at 8:59 pm to provider PA [facility] , who verbally acknowledged these results. Electronically Signed   By: Ilona Sorrel M.D.   On: 06/30/2019 20:53    Microbiology: Recent Results (from the past 240 hour(s))  Blood culture (routine x 2)     Status: None   Collection Time: 06/30/19  8:50 PM   Specimen: BLOOD  Result Value Ref Range Status   Specimen Description BLOOD LEFT ANTECUBITAL  Final   Special Requests   Final    BOTTLES DRAWN AEROBIC AND ANAEROBIC Blood Culture results may not be optimal due to an inadequate volume of blood received in culture bottles   Culture   Final    NO GROWTH 5 DAYS Performed at [facility], [place] [address] [place], [place] [address]    Report Status 07/05/2019 FINAL  Final  SARS CORONAVIRUS 2 (TAT 6-24 HRS) Nasopharyngeal Nasopharyngeal Swab     Status: None   Collection Time: 06/30/19 11:17 PM   Specimen: Nasopharyngeal Swab  Result Value Ref Range Status   SARS Coronavirus 2 NEGATIVE NEGATIVE Final    Comment: (NOTE) SARS-CoV-2 target nucleic acids are NOT DETECTED. The SARS-CoV-2 RNA is generally detectable in upper and lower respiratory specimens during the acute phase of infection. Negative results do not preclude SARS-CoV-2 infection, do not rule out co-infections with other pathogens, and should not be used as the sole basis for treatment or other patient management decisions. Negative results must be combined with clinical  observations, patient history, and epidemiological information. The expected result is Negative. Fact Sheet for Patients: SugarRoll.be Fact Sheet for Healthcare Providers: https://www.woods-mathews.com/ This test is not yet approved or cleared by the [place] FDA and  has been authorized for detection and/or diagnosis of SARS-CoV-2 by FDA under an Emergency Use Authorization (EUA). This EUA will remain  in effect (meaning this test can be used) for the duration of the COVID-19 declaration under Section 56 4(b)(1) of the Act, 21 U.S.C. section 360bbb-3(b)(1), unless the authorization is terminated or revoked sooner. Performed at [facility], [place] [address], [place], [place] [address]   Blood culture (routine x 2)     Status: None (Preliminary result)   Collection Time: 07/01/19  3:28 AM   Specimen: BLOOD LEFT ARM  Result Value Ref Range Status   Specimen Description BLOOD LEFT ARM  Final   Special Requests   Final    BOTTLES DRAWN AEROBIC AND ANAEROBIC Blood Culture adequate volume   Culture   Final    NO GROWTH 4 DAYS Performed at [facility] [address] [place], [place] [address]    Report Status PENDING  Incomplete  MRSA PCR Screening     Status: None   Collection Time: 07/01/19 12:56 PM   Specimen:  Nasopharyngeal  Result Value Ref Range Status   MRSA by PCR NEGATIVE NEGATIVE Final    Comment:        The GeneXpert MRSA Assay (FDA approved for NASAL specimens only), is one component of a comprehensive MRSA colonization surveillance program. It is not intended to diagnose MRSA infection nor to guide or monitor treatment for MRSA infections. Performed at [facility], [place] [address], [place], [place] [address]   C difficile quick scan w PCR reflex     Status: None   Collection Time: 07/02/19  5:40 PM   Specimen: STOOL  Result Value Ref Range Status   C Diff antigen NEGATIVE NEGATIVE Final   C Diff  toxin NEGATIVE NEGATIVE Final   C Diff interpretation No C. difficile detected.  Final    Comment: Performed at [facility], [place] [address] [place], [place] [address]     Labs: Basic Metabolic Panel: Recent Labs  Lab 07/01/19 0721 07/02/19 0200 07/03/19 0215 07/04/19 1110 07/06/19 0221  NA 140 143 146* 146* 141  K 4.0 3.6 2.8* 2.6* 3.3*  CL 109 116* 119* 119* 115*  CO2 17* 16* 20* 21* 19*  GLUCOSE 150* 105* 94 116* 121*  BUN 37* 51* 38* 20 13  CREATININE 1.48* 1.05 0.79 0.61 0.64  CALCIUM 8.4* 8.0* 8.4* 8.1* 7.8*  MG  --   --  1.9  --  1.5*  PHOS  --   --  1.7* 2.6 3.3   Liver Function Tests: Recent Labs  Lab 06/30/19 1935 07/02/19 0200 07/03/19 0215 07/04/19 1110  AST 43* 26  --   --   ALT 24 19  --   --   ALKPHOS 135* 92  --   --   BILITOT 0.3 0.3  --   --   PROT 6.1* 4.8*  --   --   ALBUMIN 2.7* 2.0* 2.0* 2.1*   No results for input(s): LIPASE, AMYLASE in the last 168 hours. No results for input(s): AMMONIA in the last 168 hours. CBC: Recent Labs  Lab 06/30/19 1935  07/03/19 0215 07/03/19 1759 07/04/19 0206 07/05/19 1210 07/06/19 0221  WBC 18.6*   < > 12.6* 11.3* 10.3 7.1 8.9  NEUTROABS 14.2*  --  10.2*  --   --  4.5 5.8  HGB 12.0*   < > 8.6* 8.6* 8.5* 8.7* 8.4*  HCT 38.5*   < > 25.1* 26.3* 25.8* 25.9* 24.6*  MCV 96.3   < > 88.7 91.6 89.9 89.0 87.9  PLT 297   < > 220 233 227 213 216   < > = values in this interval not displayed.   Cardiac Enzymes: No results for input(s): CKTOTAL, CKMB, CKMBINDEX, TROPONINI in the last 168 hours. BNP: BNP (last 3 results) No results for input(s): BNP in the last 8760 hours.  ProBNP (last 3 results) No results for input(s): PROBNP in the last 8760 hours.  CBG: Recent Labs  Lab 07/05/19 1200  GLUCAP 138*       Signed:  Nita Sells MD   [facility] 07/06/2019, 8:56 AM

## 2019-07-06 NOTE — Progress Notes (Addendum)
   07/06/19 0106  MEWS Score  Resp (!) 40  ECG Heart Rate 99  BP (!) 117/57  Temp (!) 97.5 F (36.4 C)  SpO2 98 %  O2 Device Room Air  MEWS Score  MEWS RR 3  MEWS Pulse 0  MEWS Systolic 0  MEWS LOC 0  MEWS Temp 0  MEWS Score 3  MEWS Score Color Yellow  MEWS Assessment  Is this an acute change? Yes  MEWS guidelines implemented *See [place]  Provider Notification  Provider Name/Title Lennox Grumbles, MD  Date Provider Notified 07/06/19  Time Provider Notified 0125  Notification Type Face-to-face  Notification Reason Change in status  Response See new orders  Date of Provider Response 07/06/19  Time of Provider Response 0130   Patient in bed, fidgeting, rapid respirations. Patient denies pain and denies needs. Ativan given IV per PRN orders. VS as above. Lennox Grumbles, NP notified as above. Will continue to monitor.

## 2019-07-07 ENCOUNTER — Non-Acute Institutional Stay (SKILLED_NURSING_FACILITY): Payer: Medicare Other | Admitting: Adult Health

## 2019-07-07 ENCOUNTER — Encounter: Payer: Self-pay | Admitting: Adult Health

## 2019-07-07 DIAGNOSIS — F5101 Primary insomnia: Secondary | ICD-10-CM

## 2019-07-07 DIAGNOSIS — F28 Other psychotic disorder not due to a substance or known physiological condition: Secondary | ICD-10-CM

## 2019-07-07 DIAGNOSIS — F29 Unspecified psychosis not due to a substance or known physiological condition: Secondary | ICD-10-CM | POA: Insufficient documentation

## 2019-07-07 DIAGNOSIS — I10 Essential (primary) hypertension: Secondary | ICD-10-CM | POA: Diagnosis not present

## 2019-07-07 DIAGNOSIS — G459 Transient cerebral ischemic attack, unspecified: Secondary | ICD-10-CM | POA: Diagnosis not present

## 2019-07-07 DIAGNOSIS — N139 Obstructive and reflux uropathy, unspecified: Secondary | ICD-10-CM | POA: Diagnosis not present

## 2019-07-07 DIAGNOSIS — K922 Gastrointestinal hemorrhage, unspecified: Secondary | ICD-10-CM | POA: Diagnosis not present

## 2019-07-07 DIAGNOSIS — E876 Hypokalemia: Secondary | ICD-10-CM

## 2019-07-07 DIAGNOSIS — F0150 Vascular dementia without behavioral disturbance: Secondary | ICD-10-CM | POA: Insufficient documentation

## 2019-07-07 DIAGNOSIS — J690 Pneumonitis due to inhalation of food and vomit: Secondary | ICD-10-CM

## 2019-07-07 DIAGNOSIS — G4700 Insomnia, unspecified: Secondary | ICD-10-CM | POA: Insufficient documentation

## 2019-07-07 DIAGNOSIS — F0151 Vascular dementia with behavioral disturbance: Secondary | ICD-10-CM

## 2019-07-07 DIAGNOSIS — F01518 Vascular dementia, unspecified severity, with other behavioral disturbance: Secondary | ICD-10-CM

## 2019-07-07 MED ORDER — METOPROLOL TARTRATE 25 MG PO TABS: 12.5000 mg | ORAL_TABLET | Freq: Every day | ORAL | 0 refills | Status: AC

## 2019-07-07 MED ORDER — AMOXICILLIN-POT CLAVULANATE 875-125 MG PO TABS: 1.0000 | ORAL_TABLET | Freq: Two times a day (BID) | ORAL | 0 refills | Status: AC

## 2019-07-07 MED ORDER — POTASSIUM CHLORIDE CRYS ER 20 MEQ PO TBCR: 20.0000 meq | EXTENDED_RELEASE_TABLET | Freq: Every day | ORAL | 0 refills | Status: AC

## 2019-07-07 MED ORDER — RISPERIDONE 0.25 MG PO TABS: 0.2500 mg | ORAL_TABLET | Freq: Every evening | ORAL | 0 refills | Status: AC

## 2019-07-07 NOTE — Progress Notes (Signed)
RN gave report to Catalina Lunger RN at [facility].

## 2019-07-07 NOTE — Plan of Care (Signed)
Plan of care needs met. Pt adequate for discharge.  

## 2019-07-07 NOTE — Progress Notes (Signed)
Location:  [facility] Room Number: [place] of Service:  SNF (31) Provider:  Durenda Age, DNP, FNP-BC  Patient Care Team: Billy Poll, MD as PCP - General (Family Medicine)  Extended Emergency Contact Information Primary Emergency Contact: Billy, [name] Work Phone: [phone] Mobile Phone: [phone] Relation: Son Secondary Emergency Contact: Billy, [name] Mobile Phone: [phone] Relation: Other  Code Status:  DNR  Goals of care: Advanced Directive information Advanced Directives 07/05/2019  Does Patient Have a Medical Advance Directive? Yes  Type of Advance Directive Out of facility DNR (pink MOST or yellow form)  Does patient want to make changes to medical advance directive? No - Patient declined  Copy of [place] in Chart? -     Chief Complaint  Patient presents with   Acute Visit    Follow-up hospitalization    HPI:  Pt is a 85 y.o. [name] who was admitted to [facility] on 07/07/19 from post hospitalization 06/30/19 to 07/06/19 for upper GI bleed. He has PMH of dementia, TIA, history of bladder and prostate cancer S/P transurethral resection of the bladder, hypertension and hyperlipidemia. Hew was sent to the hospital for concerns of diarrhea and weakness for an unknown period of time. Upon EMS arrival,he was noted to have hematemesis with bright red blood. CT of abdomen without contrast done on 07/03/19 showed inflammatory changes involving the sigmoid colon in the low midline abdomen at the level of the umbilicus. FOBT was positive. GI was consulted and suspect ulcer from chronic aspirin. Due to comorbidities, patient is at high risk for sedation and potential endoscopy related complications. Patient and son prefers conservative management. ASA was discontinued. He was put on 72 hour IV Protonix. Leukocytosis (wbc 18.8 on 06/30/19) was suspected to be due to aspiration pneumonia. Chest  x-ray on 11/20 showed probable small pleural effusions and mild bibasilar atelectasis. He was started on Unasyn  Shifted to oral Augmentin.  He was seen in his room today. He believes that he s in [place]. Informed him that he is in [place] but he continued verbalizing that he is in [place].   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[place])    Stroke [facility])    TIA   Past Surgical History:  Procedure Laterality Date   CYSTOSCOPY W/ RETROGRADES Bilateral 07/23/2018   Procedure: CYSTOSCOPY WITH RETROGRADE PYELOGRAM;  Surgeon: Ardis Hughs, MD;  Location: [facility];  Service: Urology;  Laterality: Bilateral;   EYE SURGERY     bilateral cataract surgery with lens implants   TRANSURETHRAL RESECTION OF BLADDER TUMOR WITH MITOMYCIN-C N/A 07/23/2018   Procedure: TRANSURETHRAL RESECTION OF BLADDER TUMOR WITH GEMCITABINE;  Surgeon: Ardis Hughs, MD;  Location: [facility];  Service: Urology;  Laterality: N/A;   TRANSURETHRAL RESECTION OF PROSTATE     x 2    No Known Allergies  Outpatient Encounter Medications as of 07/07/2019  Medication Sig   acetaminophen (TYLENOL) 650 MG CR tablet Take 650 mg by mouth daily.   amoxicillin-clavulanate (AUGMENTIN) 875-125 MG tablet Take 1 tablet by mouth every 12 (twelve) hours for 3 days.   guaiFENesin (MUCINEX) 600 MG 12 hr tablet Take 600 mg by mouth 2 (two) times daily as needed for cough (and congestion).   magnesium oxide (MAG-OX) 400 (241.3 Mg) MG tablet  Take 1 tablet (400 mg total) by mouth daily.   memantine (NAMENDA) 10 MG tablet Take 10 mg by mouth at bedtime.   metoprolol tartrate (LOPRESSOR) 25 MG tablet Take 0.5 tablets (12.5 mg total) by mouth daily. Hold for SBP <110 and HR <60   Multiple Vitamins-Minerals (CENTRUM SILVER 50+MEN PO) Take 1 tablet by mouth daily.   Multiple Vitamins-Minerals  (PRESERVISION AREDS 2+MULTI VIT PO) Take 1 capsule by mouth 2 (two) times daily.   pantoprazole (PROTONIX) 40 MG tablet Take 1 tablet (40 mg total) by mouth 2 (two) times daily.   potassium chloride SA (KLOR-CON) 20 MEQ tablet Take 1 tablet (20 mEq total) by mouth daily.   risperiDONE (RISPERDAL) 0.25 MG tablet Take 1 tablet (0.25 mg total) by mouth every evening.   tamsulosin (FLOMAX) 0.4 MG CAPS capsule Take 0.4 mg by mouth daily after supper.   traZODone (DESYREL) 50 MG tablet Take 0.5 tablets (25 mg total) by mouth at bedtime.   [DISCONTINUED] amoxicillin-clavulanate (AUGMENTIN) 875-125 MG tablet Take 1 tablet by mouth every 12 (twelve) hours.   [DISCONTINUED] metoprolol tartrate (LOPRESSOR) 25 MG tablet Take 12.5 mg by mouth daily.   [DISCONTINUED] potassium chloride SA (KLOR-CON) 20 MEQ tablet Take 2 tablets (40 mEq total) by mouth 2 (two) times daily.   [DISCONTINUED] risperiDONE (RISPERDAL) 0.25 MG tablet Take 1 tablet (0.25 mg total) by mouth every evening. Take 0.25 mg by mouth daily at 4:30 PM and an additional 0.125 mg once daily as needed for anxiety, hallucinations, or agitation   Facility-Administered Encounter Medications as of 07/07/2019  Medication   amoxicillin-clavulanate (AUGMENTIN) 875-125 MG per tablet 1 tablet   feeding supplement (BOOST / RESOURCE BREEZE) liquid 1 Container   LORazepam (ATIVAN) injection 0.5 mg   MEDLINE mouth rinse   memantine (NAMENDA) tablet 10 mg   metoprolol tartrate (LOPRESSOR) tablet 12.5 mg   multivitamin with minerals tablet 1 tablet   ondansetron (ZOFRAN) injection 4 mg   pantoprazole (PROTONIX) EC tablet 40 mg   risperiDONE (RISPERDAL) tablet 0.25 mg   tamsulosin (FLOMAX) capsule 0.4 mg   traZODone (DESYREL) tablet 25 mg    Review of Systems  Unable to obtain due to dementia   Immunization History  Administered Date(s) Administered   Fluad Quad(high Dose 65+) 07/06/2019   Pertinent  Health Maintenance Due    Topic Date Due   PNA vac Low Risk Adult (1 of 2 - PCV13) 02/20/1994   INFLUENZA VACCINE  Completed   Fall Risk  05/25/2018 01/21/2018  Falls in the past year? No No     Vitals:   07/07/19 1621  BP: 94/60  Pulse: 73  Resp: 20  Temp: 97.9 F (36.6 C)  Weight: 190 lb 0.6 oz (86.2 kg)  Height: 6' 0.01" (1.829 m)   Body mass index is 25.77 kg/m.  Physical Exam  GENERAL APPEARANCE: Well nourished. In no acute distress. Normal body habitus SKIN:  Skin is warm and dry.  MOUTH and THROAT: Lips are without lesions. Oral mucosa is moist and without lesions. Tongue is normal in shape, size, and color and without lesions RESPIRATORY: Breathing is even & unlabored, BS CTAB CARDIAC: RRR, no murmur,no extra heart sounds, no edema GI: Abdomen soft, normal BS, no masses, no tenderness NEUROLOGICAL: There is no tremor. Speech is clear. Alert to self, disoriented to time and place. PSYCHIATRIC:  Affect and behavior are appropriate  Labs reviewed: Recent Labs    07/03/19 0215 07/04/19 1110 07/06/19 0221  NA 146* 146*  141  K 2.8* 2.6* 3.3*  CL 119* 119* 115*  CO2 20* 21* 19*  GLUCOSE 94 116* 121*  BUN 38* 20 13  CREATININE 0.79 0.61 0.64  CALCIUM 8.4* 8.1* 7.8*  MG 1.9  --  1.5*  PHOS 1.7* 2.6 3.3   Recent Labs    06/30/19 1935 07/02/19 0200 07/03/19 0215 07/04/19 1110  AST 43* 26  --   --   ALT 24 19  --   --   ALKPHOS 135* 92  --   --   BILITOT 0.3 0.3  --   --   PROT 6.1* 4.8*  --   --   ALBUMIN 2.7* 2.0* 2.0* 2.1*   Recent Labs    07/03/19 0215  07/04/19 0206 07/05/19 1210 07/06/19 0221  WBC 12.6*   < > 10.3 7.1 8.9  NEUTROABS 10.2*  --   --  4.5 5.8  HGB 8.6*   < > 8.5* 8.7* 8.4*  HCT 25.1*   < > 25.8* 25.9* 24.6*  MCV 88.7   < > 89.9 89.0 87.9  PLT 220   < > 227 213 216   < > = values in this interval not displayed.   Lab Results  Component Value Date   TSH 2.493 12/22/2017   Lab Results  Component Value Date   HGBA1C 5.6 12/22/2017   Lab  Results  Component Value Date   CHOL 159 12/22/2017   HDL 44 12/22/2017   LDLCALC 58 12/22/2017   TRIG 284 (H) 12/22/2017   CHOLHDL 3.6 12/22/2017    Significant Diagnostic Results in last 30 days:  Ct Abdomen Wo Contrast  Result Date: 07/03/2019 CLINICAL DATA:  85 year old [name] with GI bleeding and gastric outlet obstruction EXAM: CT ABDOMEN WITHOUT CONTRAST TECHNIQUE: Multidetector CT imaging of the abdomen was performed following the standard protocol without IV contrast. COMPARISON:  Prior CT scan of the abdomen and pelvis 06/30/2019 FINDINGS: Lower chest: The heart is normal in size. No pericardial effusion. Extensive calcifications in the native coronary arteries. Calcification of the mitral valve annulus. Small bilateral pleural effusions and associated bibasilar atelectasis. Hepatobiliary: High attenuation material layers within the gallbladder lumen. Normal hepatic contour and morphology. No evidence of biliary ductal dilatation or hepatic lesion. Pancreas: Unremarkable. No pancreatic ductal dilatation or surrounding inflammatory changes. Spleen: Normal in size without focal abnormality. Adrenals/Urinary Tract: Adrenal glands are normal. No evidence of hydronephrosis or nephrolithiasis. The visualized ureters are unremarkable. Stomach/Bowel: Incompletely imaged focal bowel wall thickening involving the sigmoid colon in the low mid abdomen at the level of the umbilicus. There is associated inflammatory stranding in the adjacent mesentery. Vascular/Lymphatic: Limited evaluation in the absence of intravenous contrast. Extensive atherosclerotic vascular calcifications. Other: No evidence of ascites.  No abdominal wall hernia. Musculoskeletal: No acute fracture or aggressive appearing lytic or blastic osseous lesion. IMPRESSION: 1. Partially imaged inflammatory changes involving the sigmoid colon in the low midline abdomen at the level of the umbilicus. The entire colon was not included as the  pelvis was not imaged on this CT scan of the abdomen only. Differential considerations include diverticulitis as well as infectious, inflammatory and ischemic colitis. 2. The stomach is decompressed. No imaging findings to suggest gastric outlet obstruction at this time. 3. Additional ancillary findings as above without significant interval change compared to recent prior imaging. Electronically Signed   By: Jacqulynn Cadet M.D.   On: 07/03/2019 12:02   Dg Chest 2 View  Result Date: 07/02/2019 CLINICAL DATA:  Shortness  of breath, cough. EXAM: CHEST - 2 VIEW COMPARISON:  March 18 2019. FINDINGS: Stable cardiomediastinal silhouette. Atherosclerosis of thoracic aorta is noted no pneumothorax is noted. Mild bibasilar subsegmental atelectasis is noted with small bilateral pleural effusions. Bony thorax is unremarkable. IMPRESSION: Mild bibasilar subsegmental atelectasis is noted with probable small pleural effusions. Aortic Atherosclerosis (ICD10-I70.0). Electronically Signed   By: Marijo Conception M.D.   On: 07/02/2019 11:24   Dg Abd 1 View  Result Date: 07/02/2019 CLINICAL DATA:  Partial gastric outlet obstruction. EXAM: ABDOMEN - 1 VIEW COMPARISON:  June 30, 2019. FINDINGS: The bowel gas pattern is normal. No radio-opaque calculi or other significant radiographic abnormality are seen. IMPRESSION: No evidence of bowel obstruction or ileus. Electronically Signed   By: Marijo Conception M.D.   On: 07/02/2019 11:25   Ct Abdomen Pelvis W Contrast  Result Date: 06/30/2019 CLINICAL DATA:  Hematemesis. Generalized weakness and diarrhea. EXAM: CT ABDOMEN AND PELVIS WITH CONTRAST TECHNIQUE: Multidetector CT imaging of the abdomen and pelvis was performed using the standard protocol following bolus administration of intravenous contrast. CONTRAST:  114mL OMNIPAQUE IOHEXOL 300 MG/ML  SOLN COMPARISON:  Abdominal radiographs dated 06/30/2019 FINDINGS: Lower chest: Elevation of the left hemidiaphragm.  Bronchiectasis at the lung bases with slight accentuation of the interstitial markings and slight tree in bud densities at the right lung base. Slight atelectasis at the left lung base with pleural calcification at the left lung base posteriorly. No discrete effusion. Hepatobiliary: Liver parenchyma is normal. Dependent subtle densities in the gallbladder probably represent tiny stones. The gallbladder is not distended. No gallbladder wall thickening. Biliary tree is normal. Pancreas: Unremarkable. No pancreatic ductal dilatation or surrounding inflammatory changes. Spleen: Normal in size without focal abnormality. Adrenals/Urinary Tract: Adrenal glands are normal. No significant abnormality of the kidneys. No hydronephrosis. Bladder is normal. Stomach/Bowel: The stomach is distended with fluid. There is a slightly higher density material in the stomach which may represent ingested food or a bezoar or clot. The duodenum and small bowel are normal. Appendix is normal. Large amount of stool in the rectum. Moderate stool in the transverse and descending portions of the colon. Vascular/Lymphatic: Aortic atherosclerosis. No enlarged abdominal or pelvic lymph nodes. Reproductive: There is a focal area of enhancement at the base of the bladder to the left of midline measuring 12 x 20 x 7 mm. The patient has a history of resection of a bladder tumor as well as resection of the prostate gland. Other: No abdominal wall hernia or abnormality. No abdominopelvic ascites. Musculoskeletal: No acute or significant osseous findings. IMPRESSION: 1. Distended stomach filled with fluid and high density material which could represent a bezoar or clot. 2. Focal area of enhancement at the base of the bladder to the left of midline consistent with the patient's history of bladder tumor. This could represent scarring or tumor. 3. Large amount of stool in the rectum. 4. Probable tiny gallstones. 5. Elevation of the left hemidiaphragm. 6.  Bronchiectasis at the lung bases with slight accentuation of the interstitial markings and tree in bud densities at the right lung base. 7. Aortic Atherosclerosis (ICD10-I70.0).a Electronically Signed   By: Lorriane Shire M.D.   On: 06/30/2019 21:52   Dg Abdomen Acute W/chest  Result Date: 06/30/2019 CLINICAL DATA:  GI bleed.  Concern for perforation. EXAM: DG ABDOMEN ACUTE W/ 1V CHEST COMPARISON:  03/18/2019 chest radiograph. FINDINGS: Stable cardiomediastinal silhouette with top-normal heart size. No pneumothorax. No pleural effusion. No pulmonary edema. Low lung volumes. Mild  bibasilar scarring versus atelectasis. Questionable lucency under the left hemidiaphragm on the erect portable chest radiograph. No evidence of pneumatosis or pneumoperitoneum on the left lateral decubitus views. Relatively gasless abdomen with no dilated small bowel loops. Fluid levels scattered in the large bowel. No radiopaque nephrolithiasis. Degenerative changes in the lumbar spine. IMPRESSION: 1. Questionable lucency under the left hemidiaphragm on the upright portable chest radiograph, although with no evidence of pneumatosis or pneumoperitoneum on the left lateral decubitus views. If there is continued clinical concern for free air, dedicated CT of the abdomen and pelvis with oral and IV contrast would be recommended for further evaluation. 2. Low lung volumes with mild bibasilar scarring versus atelectasis. 3. Relatively gasless abdomen with no specific findings of bowel obstruction. Scattered fluid levels in the large bowel. These results were called by telephone at the time of interpretation on 06/30/2019 at 8:59 pm to provider PA [facility] [place] , who verbally acknowledged these results. Electronically Signed   By: Ilona Sorrel M.D.   On: 06/30/2019 20:53    Assessment/Plan  1. Upper GI bleed - Inflammatory changes in sigmoid colon seen on CT abdomen 07/03/19 - GI was consulted and suspect ulcer from chronic aspirin,  discontinued aspirin, not a candidate for potential endoscopy due to comorbidities, high risk for sedation and potential endoscopy related complications,  - S/P [id number] IV Protonix and shifted to oral Protonix  2. Essential hypertension - BP 94r/60, on metoprolol 25 mg 1/2 tab = 12.5 mg daily, hold for SBP 11/ and HR < 60  3. TIA (transient ischemic attack) - ASA on hold due to GI bleed  4. Obstructive uropathy -Continue Flomax 0.4 mg 1 capsule daily  5. Aspiration pneumonia due to vomit, unspecified laterality, unspecified part of lung ([place]) -Was a started on Unasyn then shifted to Augmentin  6. Primary insomnia -Continue trazodone 50 mg 1/2 tab = 25 mg at bedtime  7. Hypokalemia Lab Results  Component Value Date   K 3.3 (L) 07/06/2019   -will start Klor-Con 20 meq daily  8. Hypomagnesemia  - continue Mag-Ox 400 mg 1 tab daily continue Mag-Ox 400 mg 1 tab daily  9. 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 Vascular dementia with behavior disturbance (HCC) -Continue Namenda 10 mg 1 tab at bedtime, supportive care, fall precautions    Family/ staff Communication:  Discussed plan of care with resident and charge nurse.  Labs/tests ordered:  CBC, CMP and Mg on 11/30  Goals of care:  Short-term care   Durenda Age, DNP, FNP-BC [facility] and Adult Medicine [phone] (Monday-Friday 8:00 a.m. - 5:00 p.m.) [phone] (after hours)

## 2019-07-07 NOTE — TOC Transition Note (Signed)
Transition of Care [facility]) - CM/SW Discharge Note   Patient Details  Name: Billy [name] MRN: [id number] Date of Birth: October 14, 1926  [facility]) CM/SW Contact:  Sharin Mons, RN Phone Number: 07/07/2019, 11:59 AM   Clinical Narrative:    Patient will DC to: [facility] SNF Anticipated DC date: 07/06/2019 Family notified: [name] (son) Transport by: Corey Harold Per MD patient ready for DC to [facility] SN[facility]  . RN, patient, patient's family, and facility notified of DC. Discharge Summary and FL2 sent to facility. RN to call report prior to discharge [phone]). Rm 304. DC packet on chart. Ambulance transport requested for patient.   RNCM will sign off for now as intervention is no longer needed. Please consult [place] again if new needs arise.    Final next level of care: Skilled Nursing Facility([facility]) Barriers to Discharge: No Barriers Identified   Patient Goals and CMS Choice        Discharge Placement                       Discharge Plan and Services                                     Social Determinants of Health (SDOH) Interventions     Readmission Risk Interventions No flowsheet data found.

## 2019-07-07 NOTE — Progress Notes (Signed)
Patient was stable at discharge. I removed their IV. RN reviewed the discharge education with Catalina Lunger RN at [facility]. She verbalized understanding and had no further questions. Patient left with belongings in hand via [place] transport.

## 2019-07-07 NOTE — Progress Notes (Addendum)
NCM received call from [facility] admission stating they will not extend bed offer for SNF placement. However ,[facility] SNF selected as a back up form son and is able to offer SNF bed today. NCM has tried to call son to made him aware, however, call unsuccessful. Voice message left ...awaiting call back. Whitman Hero RN,BSN,CM

## 2019-07-07 NOTE — Progress Notes (Signed)
Ready for d/c  No changes to d/c summary from 11/25  Verneita Griffes, MD [facility] 1:33 PM

## 2019-07-10 ENCOUNTER — Encounter (HOSPITAL_COMMUNITY): Payer: Self-pay | Admitting: *Deleted

## 2019-07-10 ENCOUNTER — Emergency Department (HOSPITAL_COMMUNITY): Payer: Medicare Other

## 2019-07-10 ENCOUNTER — Inpatient Hospital Stay (HOSPITAL_COMMUNITY)
Admission: EM | Admit: 2019-07-10 | Discharge: 2019-07-13 | DRG: 871 | Disposition: E | Payer: Medicare Other | Attending: Internal Medicine | Admitting: Internal Medicine

## 2019-07-10 ENCOUNTER — Other Ambulatory Visit: Payer: Self-pay

## 2019-07-10 DIAGNOSIS — R778 Other specified abnormalities of plasma proteins: Secondary | ICD-10-CM

## 2019-07-10 DIAGNOSIS — J9811 Atelectasis: Secondary | ICD-10-CM | POA: Diagnosis present

## 2019-07-10 DIAGNOSIS — Z8249 Family history of ischemic heart disease and other diseases of the circulatory system: Secondary | ICD-10-CM

## 2019-07-10 DIAGNOSIS — Z66 Do not resuscitate: Secondary | ICD-10-CM | POA: Diagnosis present

## 2019-07-10 DIAGNOSIS — E7800 Pure hypercholesterolemia, unspecified: Secondary | ICD-10-CM | POA: Diagnosis present

## 2019-07-10 DIAGNOSIS — F0390 Unspecified dementia without behavioral disturbance: Secondary | ICD-10-CM | POA: Diagnosis present

## 2019-07-10 DIAGNOSIS — N3281 Overactive bladder: Secondary | ICD-10-CM | POA: Diagnosis present

## 2019-07-10 DIAGNOSIS — D649 Anemia, unspecified: Secondary | ICD-10-CM | POA: Diagnosis present

## 2019-07-10 DIAGNOSIS — E872 Acidosis, unspecified: Secondary | ICD-10-CM | POA: Diagnosis present

## 2019-07-10 DIAGNOSIS — E871 Hypo-osmolality and hyponatremia: Secondary | ICD-10-CM | POA: Diagnosis present

## 2019-07-10 DIAGNOSIS — Z87891 Personal history of nicotine dependence: Secondary | ICD-10-CM

## 2019-07-10 DIAGNOSIS — R6521 Severe sepsis with septic shock: Secondary | ICD-10-CM | POA: Diagnosis present

## 2019-07-10 DIAGNOSIS — Z20828 Contact with and (suspected) exposure to other viral communicable diseases: Secondary | ICD-10-CM | POA: Diagnosis present

## 2019-07-10 DIAGNOSIS — H9190 Unspecified hearing loss, unspecified ear: Secondary | ICD-10-CM | POA: Diagnosis present

## 2019-07-10 DIAGNOSIS — Z7189 Other specified counseling: Secondary | ICD-10-CM | POA: Diagnosis not present

## 2019-07-10 DIAGNOSIS — J690 Pneumonitis due to inhalation of food and vomit: Secondary | ICD-10-CM | POA: Diagnosis not present

## 2019-07-10 DIAGNOSIS — E785 Hyperlipidemia, unspecified: Secondary | ICD-10-CM | POA: Diagnosis present

## 2019-07-10 DIAGNOSIS — J90 Pleural effusion, not elsewhere classified: Secondary | ICD-10-CM | POA: Diagnosis present

## 2019-07-10 DIAGNOSIS — G4700 Insomnia, unspecified: Secondary | ICD-10-CM | POA: Diagnosis present

## 2019-07-10 DIAGNOSIS — Z8546 Personal history of malignant neoplasm of prostate: Secondary | ICD-10-CM

## 2019-07-10 DIAGNOSIS — A419 Sepsis, unspecified organism: Secondary | ICD-10-CM | POA: Diagnosis not present

## 2019-07-10 DIAGNOSIS — Z9079 Acquired absence of other genital organ(s): Secondary | ICD-10-CM

## 2019-07-10 DIAGNOSIS — R0603 Acute respiratory distress: Secondary | ICD-10-CM | POA: Diagnosis not present

## 2019-07-10 DIAGNOSIS — Z79899 Other long term (current) drug therapy: Secondary | ICD-10-CM

## 2019-07-10 DIAGNOSIS — Z8673 Personal history of transient ischemic attack (TIA), and cerebral infarction without residual deficits: Secondary | ICD-10-CM

## 2019-07-10 DIAGNOSIS — R6520 Severe sepsis without septic shock: Secondary | ICD-10-CM

## 2019-07-10 DIAGNOSIS — I959 Hypotension, unspecified: Secondary | ICD-10-CM

## 2019-07-10 DIAGNOSIS — Z515 Encounter for palliative care: Secondary | ICD-10-CM | POA: Diagnosis not present

## 2019-07-10 DIAGNOSIS — I10 Essential (primary) hypertension: Secondary | ICD-10-CM | POA: Diagnosis present

## 2019-07-10 DIAGNOSIS — J9601 Acute respiratory failure with hypoxia: Secondary | ICD-10-CM | POA: Diagnosis present

## 2019-07-10 DIAGNOSIS — G253 Myoclonus: Secondary | ICD-10-CM

## 2019-07-10 LAB — CBC WITH DIFFERENTIAL/PLATELET
Abs Immature Granulocytes: 0.27 10*3/uL — ABNORMAL HIGH (ref 0.00–0.07)
Basophils Absolute: 0 10*3/uL (ref 0.0–0.1)
Basophils Relative: 0 %
Eosinophils Absolute: 0.2 10*3/uL (ref 0.0–0.5)
Eosinophils Relative: 1 %
HCT: 26.8 % — ABNORMAL LOW (ref 39.0–52.0)
Hemoglobin: 8.6 g/dL — ABNORMAL LOW (ref 13.0–17.0)
Immature Granulocytes: 2 %
Lymphocytes Relative: 30 %
Lymphs Abs: 4.4 10*3/uL — ABNORMAL HIGH (ref 0.7–4.0)
MCH: 29.8 pg (ref 26.0–34.0)
MCHC: 32.1 g/dL (ref 30.0–36.0)
MCV: 92.7 fL (ref 80.0–100.0)
Monocytes Absolute: 0.9 10*3/uL (ref 0.1–1.0)
Monocytes Relative: 6 %
Neutro Abs: 8.8 10*3/uL — ABNORMAL HIGH (ref 1.7–7.7)
Neutrophils Relative %: 61 %
Platelets: 365 10*3/uL (ref 150–400)
RBC: 2.89 MIL/uL — ABNORMAL LOW (ref 4.22–5.81)
RDW: 16.1 % — ABNORMAL HIGH (ref 11.5–15.5)
WBC: 14.7 10*3/uL — ABNORMAL HIGH (ref 4.0–10.5)
nRBC: 0 % (ref 0.0–0.2)

## 2019-07-10 LAB — COMPREHENSIVE METABOLIC PANEL
ALT: 18 U/L (ref 0–44)
AST: 28 U/L (ref 15–41)
Albumin: 2.1 g/dL — ABNORMAL LOW (ref 3.5–5.0)
Alkaline Phosphatase: 101 U/L (ref 38–126)
Anion gap: 15 (ref 5–15)
BUN: 18 mg/dL (ref 8–23)
CO2: 16 mmol/L — ABNORMAL LOW (ref 22–32)
Calcium: 8.2 mg/dL — ABNORMAL LOW (ref 8.9–10.3)
Chloride: 103 mmol/L (ref 98–111)
Creatinine, Ser: 1.16 mg/dL (ref 0.61–1.24)
GFR calc Af Amer: >60 mL/min (ref >60)
GFR calc non Af Amer: 55 mL/min — ABNORMAL LOW (ref >60)
Glucose, Bld: 195 mg/dL — ABNORMAL HIGH (ref 70–99)
Potassium: 4.6 mmol/L (ref 3.5–5.1)
Sodium: 134 mmol/L — ABNORMAL LOW (ref 135–145)
Total Bilirubin: 0.7 mg/dL (ref 0.3–1.2)
Total Protein: 5.3 g/dL — ABNORMAL LOW (ref 6.5–8.1)

## 2019-07-10 LAB — POCT I-STAT EG7
Acid-base deficit: 7 mmol/L — ABNORMAL HIGH (ref 0.0–2.0)
Bicarbonate: 16 mmol/L — ABNORMAL LOW (ref 20.0–28.0)
Calcium, Ion: 0.99 mmol/L — ABNORMAL LOW (ref 1.15–1.40)
HCT: 24 % — ABNORMAL LOW (ref 39.0–52.0)
Hemoglobin: 8.2 g/dL — ABNORMAL LOW (ref 13.0–17.0)
O2 Saturation: 99 %
Potassium: 4.6 mmol/L (ref 3.5–5.1)
Sodium: 132 mmol/L — ABNORMAL LOW (ref 135–145)
TCO2: 17 mmol/L — ABNORMAL LOW (ref 22–32)
pCO2, Ven: 23.1 mmHg — ABNORMAL LOW (ref 44.0–60.0)
pH, Ven: 7.449 — ABNORMAL HIGH (ref 7.250–7.430)
pO2, Ven: 145 mmHg — ABNORMAL HIGH (ref 32.0–45.0)

## 2019-07-10 LAB — TROPONIN I (HIGH SENSITIVITY): Troponin I (High Sensitivity): 61 ng/L — ABNORMAL HIGH (ref <18)

## 2019-07-10 LAB — LACTIC ACID, PLASMA: Lactic Acid, Venous: 4.9 mmol/L (ref 0.5–1.9)

## 2019-07-10 LAB — POC SARS CORONAVIRUS 2 AG -  ED: SARS Coronavirus 2 Ag: NEGATIVE

## 2019-07-10 MED ORDER — ONDANSETRON HCL 4 MG/2ML IJ SOLN: 4.0000 mg | Freq: Four times a day (QID) | INTRAMUSCULAR | Status: DC | PRN

## 2019-07-10 MED ORDER — POLYVINYL ALCOHOL 1.4 % OP SOLN
1.0000 [drp] | Freq: Four times a day (QID) | OPHTHALMIC | Status: DC | PRN
  Filled 2019-07-10: qty 15

## 2019-07-10 MED ORDER — SENNA 8.6 MG PO TABS: 1.0000 | ORAL_TABLET | Freq: Every evening | ORAL | Status: DC | PRN

## 2019-07-10 MED ORDER — SODIUM CHLORIDE 0.9 % IV BOLUS
1000.0000 mL | Freq: Once | INTRAVENOUS | Status: AC
  Administered 2019-07-10: 1000 mL via INTRAVENOUS

## 2019-07-10 MED ORDER — FLEET ENEMA 7-19 GM/118ML RE ENEM: 1.0000 | ENEMA | Freq: Every day | RECTAL | Status: DC | PRN

## 2019-07-10 MED ORDER — ONDANSETRON 4 MG PO TBDP: 4.0000 mg | ORAL_TABLET | Freq: Four times a day (QID) | ORAL | Status: DC | PRN

## 2019-07-10 MED ORDER — LORAZEPAM 1 MG PO TABS: 1.0000 mg | ORAL_TABLET | ORAL | Status: DC | PRN

## 2019-07-10 MED ORDER — GLYCOPYRROLATE 1 MG PO TABS
1.0000 mg | ORAL_TABLET | ORAL | Status: DC | PRN
  Filled 2019-07-10: qty 1

## 2019-07-10 MED ORDER — MORPHINE SULFATE (CONCENTRATE) 10 MG/0.5ML PO SOLN: 5.0000 mg | ORAL | Status: DC | PRN

## 2019-07-10 MED ORDER — HALOPERIDOL 0.5 MG PO TABS
0.5000 mg | ORAL_TABLET | ORAL | Status: DC | PRN
  Filled 2019-07-10: qty 1

## 2019-07-10 MED ORDER — BIOTENE DRY MOUTH MT LIQD: 15.0000 mL | OROMUCOSAL | Status: DC | PRN

## 2019-07-10 MED ORDER — PIPERACILLIN-TAZOBACTAM 3.375 G IVPB 30 MIN: 3.3750 g | Freq: Once | INTRAVENOUS | Status: DC

## 2019-07-10 MED ORDER — HALOPERIDOL LACTATE 5 MG/ML IJ SOLN
0.5000 mg | INTRAMUSCULAR | Status: DC | PRN
  Administered 2019-07-11 (×2): 0.5 mg via INTRAVENOUS
  Filled 2019-07-10 (×2): qty 1

## 2019-07-10 MED ORDER — BISACODYL 10 MG RE SUPP: 10.0000 mg | Freq: Every day | RECTAL | Status: DC | PRN

## 2019-07-10 MED ORDER — HALOPERIDOL LACTATE 2 MG/ML PO CONC
0.5000 mg | ORAL | Status: DC | PRN
  Filled 2019-07-10: qty 0.3

## 2019-07-10 MED ORDER — GLYCOPYRROLATE 0.2 MG/ML IJ SOLN: 0.2000 mg | INTRAMUSCULAR | Status: DC | PRN

## 2019-07-10 MED ORDER — MORPHINE 100MG IN NS 100ML (1MG/ML) PREMIX INFUSION
1.0000 mg/h | INTRAVENOUS | Status: DC
  Administered 2019-07-10 (×2): 2 mg/h via INTRAVENOUS
  Administered 2019-07-10: 1 mg/h via INTRAVENOUS
  Administered 2019-07-10: 2 mg/h via INTRAVENOUS
  Filled 2019-07-10: qty 100

## 2019-07-10 MED ORDER — ACETAMINOPHEN 650 MG RE SUPP: 650.0000 mg | Freq: Four times a day (QID) | RECTAL | Status: DC | PRN

## 2019-07-10 MED ORDER — VANCOMYCIN HCL 10 G IV SOLR: 1750.0000 mg | Freq: Once | INTRAVENOUS | Status: DC

## 2019-07-10 MED ORDER — ACETAMINOPHEN 325 MG PO TABS: 650.0000 mg | ORAL_TABLET | Freq: Four times a day (QID) | ORAL | Status: DC | PRN

## 2019-07-10 MED ORDER — LORAZEPAM 2 MG/ML IJ SOLN: 1.0000 mg | INTRAMUSCULAR | Status: DC | PRN

## 2019-07-10 MED ORDER — LORAZEPAM 2 MG/ML PO CONC: 1.0000 mg | ORAL | Status: DC | PRN

## 2019-07-10 NOTE — ED Triage Notes (Signed)
Pt arrived via [facility]. PT on 100% rebreather mask at 15 lit. Pt has grunting resp. Pt unable to answer questions.Pt eyes open but not talking. Son called to get info on DNR . Son Bobbye Sapia.

## 2019-07-10 NOTE — Care Management (Signed)
ED CM spoke with EDP concerning goals of care planning for end of life. Palliative consult suggested. TOC team will continue to follow for assistance for next level of care.

## 2019-07-10 NOTE — H&P (Signed)
History and Physical    Duke Mullineaux [id number] DOB: May 30, 1927 DOA: 06/20/2019  Referring MD/NP/PA: Deno Etienne, MD PCP: Reymundo Poll, MD  Patient coming from: [facility] rehab via  EMS  Chief Complaint: Shortness of breath  I have personally briefly reviewed patient's old medical records in [place]   HPI: Billy [name] is a 85 y.o. male with medical history significant of HTN, HLD, dementia, TIA, and bladder and prostate cancer s/p transurethral resection of the bladder.  Patient presents after being found to be in acute respiratory distress at the nursing facility.  Much of history is obtained from son who is present at bedside and review of records as he has history of dementia and currently nonverbal.  He had just been hospitalized from 11/18-11/24, for upper GI bleed.  CT imaging had showed signs of a bezoar versus large stool burden.  GI was consulted and hemoglobin trended down from 11 down to 8.4, but appeared stabilized.  No endoscopic evaluation was performed.  Patient was discharged to [facility] for rehab.  Son notes that during his last hospitalization he was advised to patient being near end-of-life.  The patient also had previously been admitted in ICU once before with suspected aspiration pneumonia.  At this time he would like to keep the patient comfortable.  ED Course: In the emergency department patient was noted to be hypotensive with blood pressure 63/25, respirations 34, pulse 104, and O2 saturations maintained on 15 L nonrebreather mask.  Labs significant for WBC 14.7, hemoglobin 8.2, sodium 134, high-sensitivity troponin 61, and lactic acid 4.9.  Covid -19 testing was negative.  Patient had initially been given 1 L bolus of normal saline IV fluids without improvement of blood pressures.  The patient's son confirms that the patient has a DO NOT RESUSCITATE order and would like him to be made comfort care at this time.   Review of Systems  Unable to perform ROS:  Acuity of condition    Past Medical History:  Diagnosis Date  . Hearing loss    hearing aids to both ears  . Hypercholesterolemia   . Hypertension   . Overactive bladder   . 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 Stroke [facility])    TIA    Past Surgical History:  Procedure Laterality Date  . CYSTOSCOPY W/ RETROGRADES Bilateral 07/23/2018   Procedure: CYSTOSCOPY WITH RETROGRADE PYELOGRAM;  Surgeon: Ardis Hughs, MD;  Location: [facility] ORS;  Service: Urology;  Laterality: Bilateral;  . EYE SURGERY     bilateral cataract surgery with lens implants  . TRANSURETHRAL RESECTION OF BLADDER TUMOR WITH MITOMYCIN-C N/A 07/23/2018   Procedure: TRANSURETHRAL RESECTION OF BLADDER TUMOR WITH GEMCITABINE;  Surgeon: Ardis Hughs, MD;  Location: [facility] ORS;  Service: Urology;  Laterality: N/A;  . TRANSURETHRAL RESECTION OF PROSTATE     x 2     reports that he has quit smoking. His smoking use included pipe. He has never used smokeless tobacco. He reports previous alcohol use. He reports previous drug use.  No Known Allergies  Family History  Problem Relation Age of Onset  . Hypertension Mother   . Hypertension Father     Prior to Admission medications   Medication Sig Start Date End Date Taking? Authorizing Provider  acetaminophen (TYLENOL) 650 MG CR tablet Take 650 mg by mouth daily.    [provider]  amoxicillin-clavulanate (AUGMENTIN) 875-125 MG tablet Take 1 tablet by mouth every 12 (twelve) hours for 3 days. 07/07/19 06/14/2019  Medina-Vargas, Monina  C, NP  guaiFENesin (MUCINEX) 600 MG 12 hr tablet Take 600 mg by mouth 2 (two) times daily as needed for cough (and congestion).    [provider]  magnesium oxide (MAG-OX) 400 (241.3 Mg) MG tablet Take 1 tablet (400 mg total) by mouth daily. 07/06/19   Nita Sells, MD  memantine (NAMENDA) 10 MG tablet Take 10 mg by mouth at bedtime.    [provider]  metoprolol tartrate (LOPRESSOR) 25 MG tablet Take 0.5 tablets  (12.5 mg total) by mouth daily. Hold for SBP <110 and HR <60 07/07/19   Medina-Vargas, Monina C, NP  Multiple Vitamins-Minerals (CENTRUM SILVER 50+MEN PO) Take 1 tablet by mouth daily.    [provider]  Multiple Vitamins-Minerals (PRESERVISION AREDS 2+MULTI VIT PO) Take 1 capsule by mouth 2 (two) times daily.    [provider]  pantoprazole (PROTONIX) 40 MG tablet Take 1 tablet (40 mg total) by mouth 2 (two) times daily. 07/06/19   Nita Sells, MD  potassium chloride SA (KLOR-CON) 20 MEQ tablet Take 1 tablet (20 mEq total) by mouth daily. 07/07/19   Medina-Vargas, Monina C, NP  risperiDONE (RISPERDAL) 0.25 MG tablet Take 1 tablet (0.25 mg total) by mouth every evening. 07/07/19   Medina-Vargas, Monina C, NP  tamsulosin (FLOMAX) 0.4 MG CAPS capsule Take 0.4 mg by mouth daily after supper.    [provider]  traZODone (DESYREL) 50 MG tablet Take 0.5 tablets (25 mg total) by mouth at bedtime. 07/06/19   Nita Sells, MD    Physical Exam:  Constitutional: Elderly male who is toxic appearing Vitals:   06/30/2019 0918 07/09/2019 0930  BP:  (!) 63/25  Pulse:  (!) 104  Resp:  (!) 34  SpO2: 97% 100%   Eyes: PERRL, lids and conjunctivae normal ENMT: Mucous membranes are dry. Posterior pharynx clear of any exudate or lesions.  Neck: normal, supple, no masses, no thyromegaly Respiratory: Tachypneic with coarse breath sounds all lung fields.  Patient currently on nonrebreather mask. Cardiovascular: Tachycardic, no murmurs / rubs / gallops. No extremity edema. 2+ pedal pulses. No carotid bruits.  Abdomen: no tenderness, no masses palpated. No hepatosplenomegaly. Bowel sounds positive.  Musculoskeletal: no clubbing / cyanosis. No joint deformity upper and lower extremities. Good ROM, no contractures. Normal muscle tone.  Skin: Pallor present.  No rashes, lesions, ulcers. No induration Neurologic: CN 2-12 grossly intact. Sensation intact, DTR normal. Strength  5/5 in all 4.  Psychiatric: Patient alert, but only making grunting sounds   Labs on Admission: I have personally reviewed following labs and imaging studies  CBC: Recent Labs  Lab 07/03/19 1759 07/04/19 0206 07/05/19 1210 07/06/19 0221 06/26/2019 0928 07/12/2019 0944  WBC 11.3* 10.3 7.1 8.9 14.7*  --   NEUTROABS  --   --  4.5 5.8 8.8*  --   HGB 8.6* 8.5* 8.7* 8.4* 8.6* 8.2*  HCT 26.3* 25.8* 25.9* 24.6* 26.8* 24.0*  MCV 91.6 89.9 89.0 87.9 92.7  --   PLT 233 227 213 216 365  --    Basic Metabolic Panel: Recent Labs  Lab 07/04/19 1110 07/06/19 0221 06/22/2019 0928 07/05/2019 0944  NA 146* 141 134* 132*  K 2.6* 3.3* 4.6 4.6  CL 119* 115* 103  --   CO2 21* 19* 16*  --   GLUCOSE 116* 121* 195*  --   BUN 20 13 18   --   CREATININE 0.61 0.64 1.16  --   CALCIUM 8.1* 7.8* 8.2*  --   MG  --  1.5*  --   --   PHOS 2.6 3.3  --   --    GFR: Estimated Creatinine Clearance: 46.5 mL/min (by C-G formula based on SCr of 1.16 mg/dL). Liver Function Tests: Recent Labs  Lab 07/04/19 1110 06/16/2019 0928  AST  --  28  ALT  --  18  ALKPHOS  --  101  BILITOT  --  0.7  PROT  --  5.3*  ALBUMIN 2.1* 2.1*   No results for input(s): LIPASE, AMYLASE in the last 168 hours. No results for input(s): AMMONIA in the last 168 hours. Coagulation Profile: No results for input(s): INR, PROTIME in the last 168 hours. Cardiac Enzymes: No results for input(s): CKTOTAL, CKMB, CKMBINDEX, TROPONINI in the last 168 hours. BNP (last 3 results) No results for input(s): PROBNP in the last 8760 hours. HbA1C: No results for input(s): HGBA1C in the last 72 hours. CBG: Recent Labs  Lab 07/05/19 1200  GLUCAP 138*   Lipid Profile: No results for input(s): CHOL, HDL, LDLCALC, TRIG, CHOLHDL, LDLDIRECT in the last 72 hours. Thyroid Function Tests: No results for input(s): TSH, T4TOTAL, FREET4, T3FREE, THYROIDAB in the last 72 hours. Anemia Panel: No results for input(s): VITAMINB12, FOLATE, FERRITIN, TIBC,  IRON, RETICCTPCT in the last 72 hours. Urine analysis:    Component Value Date/Time   COLORURINE AMBER (A) 07/01/2019 0104   APPEARANCEUR HAZY (A) 07/01/2019 0104   LABSPEC 1.031 (H) 07/01/2019 0104   PHURINE 5.0 07/01/2019 0104   GLUCOSEU NEGATIVE 07/01/2019 0104   HGBUR NEGATIVE 07/01/2019 0104   BILIRUBINUR NEGATIVE 07/01/2019 0104   KETONESUR NEGATIVE 07/01/2019 0104   PROTEINUR NEGATIVE 07/01/2019 0104   NITRITE NEGATIVE 07/01/2019 0104   LEUKOCYTESUR NEGATIVE 07/01/2019 0104   Sepsis Labs: Recent Results (from the past 240 hour(s))  Blood culture (routine x 2)     Status: None   Collection Time: 06/30/19  8:50 PM   Specimen: BLOOD  Result Value Ref Range Status   Specimen Description BLOOD LEFT ANTECUBITAL  Final   Special Requests   Final    BOTTLES DRAWN AEROBIC AND ANAEROBIC Blood Culture results may not be optimal due to an inadequate volume of blood received in culture bottles   Culture   Final    NO GROWTH 5 DAYS Performed at [facility], [place] [address], [place], [place] [address]    Report Status 07/05/2019 FINAL  Final  SARS CORONAVIRUS 2 (TAT 6-24 HRS) Nasopharyngeal Nasopharyngeal Swab     Status: None   Collection Time: 06/30/19 11:17 PM   Specimen: Nasopharyngeal Swab  Result Value Ref Range Status   SARS Coronavirus 2 NEGATIVE NEGATIVE Final    Comment: (NOTE) SARS-CoV-2 target nucleic acids are NOT DETECTED. The SARS-CoV-2 RNA is generally detectable in upper and lower respiratory specimens during the acute phase of infection. Negative results do not preclude SARS-CoV-2 infection, do not rule out co-infections with other pathogens, and should not be used as the sole basis for treatment or other patient management decisions. Negative results must be combined with clinical observations, patient history, and epidemiological information. The expected result is Negative. Fact Sheet for Patients: SugarRoll.be Fact Sheet  for Healthcare Providers: https://www.woods-mathews.com/ This test is not yet approved or cleared by the [place] FDA and  has been authorized for detection and/or diagnosis of SARS-CoV-2 by FDA under an Emergency Use Authorization (EUA). This EUA will remain  in effect (meaning this test can be used) for the duration of the COVID-19 declaration under  Section 56 4(b)(1) of the Act, 21 U.S.C. section 360bbb-3(b)(1), unless the authorization is terminated or revoked sooner. Performed at [facility], [place] [address] [place], [place] [address]   Blood culture (routine x 2)     Status: None   Collection Time: 07/01/19  3:28 AM   Specimen: BLOOD LEFT ARM  Result Value Ref Range Status   Specimen Description BLOOD LEFT ARM  Final   Special Requests   Final    BOTTLES DRAWN AEROBIC AND ANAEROBIC Blood Culture adequate volume   Culture   Final    NO GROWTH 5 DAYS Performed at [facility], [address] [place], [place] [address]    Report Status 07/06/2019 FINAL  Final  MRSA PCR Screening     Status: None   Collection Time: 07/01/19 12:56 PM   Specimen: Nasopharyngeal  Result Value Ref Range Status   MRSA by PCR NEGATIVE NEGATIVE Final    Comment:        The GeneXpert MRSA Assay (FDA approved for NASAL specimens only), is one component of a comprehensive MRSA colonization surveillance program. It is not intended to diagnose MRSA infection nor to guide or monitor treatment for MRSA infections. Performed at [facility], [place] [address], [place], [place] [address]   C difficile quick scan w PCR reflex     Status: None   Collection Time: 07/02/19  5:40 PM   Specimen: STOOL  Result Value Ref Range Status   C Diff antigen NEGATIVE NEGATIVE Final   C Diff toxin NEGATIVE NEGATIVE Final   C Diff interpretation No C. difficile detected.  Final    Comment: Performed at [facility], [place] [address] [place], [place] [address]  SARS CORONAVIRUS  2 (TAT 6-24 HRS) Nasopharyngeal Nasopharyngeal Swab     Status: None   Collection Time: 07/06/19  7:38 AM   Specimen: Nasopharyngeal Swab  Result Value Ref Range Status   SARS Coronavirus 2 NEGATIVE NEGATIVE Final    Comment: (NOTE) SARS-CoV-2 target nucleic acids are NOT DETECTED. The SARS-CoV-2 RNA is generally detectable in upper and lower respiratory specimens during the acute phase of infection. Negative results do not preclude SARS-CoV-2 infection, do not rule out co-infections with other pathogens, and should not be used as the sole basis for treatment or other patient management decisions. Negative results must be combined with clinical observations, patient history, and epidemiological information. The expected result is Negative. Fact Sheet for Patients: SugarRoll.be Fact Sheet for Healthcare Providers: https://www.woods-mathews.com/ This test is not yet approved or cleared by the [place] FDA and  has been authorized for detection and/or diagnosis of SARS-CoV-2 by FDA under an Emergency Use Authorization (EUA). This EUA will remain  in effect (meaning this test can be used) for the duration of the COVID-19 declaration under Section 56 4(b)(1) of the Act, 21 U.S.C. section 360bbb-3(b)(1), unless the authorization is terminated or revoked sooner. Performed at [facility], [place] [address] [place], [place] [address]      Radiological Exams on Admission: Dg Chest Port 1 View  Result Date: 06/22/2019 CLINICAL DATA:  Shortness of breath EXAM: PORTABLE CHEST 1 VIEW COMPARISON:  July 02, 2019 FINDINGS: There is mild atelectatic change in the left base with small left pleural effusion. The lungs elsewhere are clear. There is an azygos lobe on the right, an anatomic variant. Heart is upper normal in size with pulmonary vascularity normal. No adenopathy. There is aortic atherosclerosis. Bones  are osteoporotic. IMPRESSION:  Small left pleural effusion with mild left base atelectasis. No edema or consolidation. Heart upper normal in size. Aortic Atherosclerosis (ICD10-I70.0). Electronically Signed   By: Lowella Grip III M.D.   On: 07/09/2019 10:23    EKG: Independently reviewed.  Sinus rhythm at 95 bpm   Assessment/Plan Severe sepsis secondary to aspiration pneumonia  Acute respiratory failure secondary to hypoxia Hypotension Normocytic anemia Lactic acidosis  Elevated high-sensitivity troponin Essential hypertension Hyperlipidemia History of bladder/prostate cancer status post TURP Dementia DO NOT RESUSCITATE on admission End-of-life care Comfort care measures only    Patient presented in acute respiratory distress after suspected to have a possible aspiration event.  Chest x-ray showing small left-sided pleural effusion with left base atelectasis, but no clear consolidation was noted.  Son was present at bedside and confirmed DO NOT RESUSCITATE wishes.  Son reports that he was advised that the patient appeared to be in the process of dying during his last admission.  Given patient's poor overall prognosis at this time with severe sepsis and hypotension suspected secondary to an aspiration event the patient's son would like to make the patient comfortable at this time.  Palliative care was consulted and discussed this as well.  Plan on admitting for comfort measures only.  Discontinuing all labs and aggressive measures.  Orders placed for morphine drip with additional comfort care orders.  Patient expected to pass during this hospitalization.   DVT prophylaxis: None Code Status: DNR Family Communication: Discussed plan of care with the patient and family present at bedside. Disposition Plan:  likely to expire Consults called: Palliative care Admission status: observation  Norval Morton MD [facility] Pager [phone]   If 7PM-7AM, please contact night-coverage [email or web address] Password  [facility]  06/22/2019, 11:35 AM

## 2019-07-10 NOTE — ED Provider Notes (Signed)
[facility] EMERGENCY DEPARTMENT Provider Note   CSN: [id number] Arrival date & time: 06/28/2019  0909     History   Chief Complaint Chief Complaint  Patient presents with  . Shortness of Breath    HPI Billy [name] is a 85 y.o. male.     85 yo M with a chief complaints of shortness of breath.  This was noticed this morning.  Patient has dementia and is limited in his ability to give history.  Level 5 caveat dementia.  Per EMS he is in a facility for rehab.  Was reportedly normal yesterday and then had significant shortness of breath when they evaluated him this morning.  The history is provided by the patient and the EMS personnel.  Shortness of Breath Associated symptoms: no abdominal pain, no chest pain, no fever, no headaches, no rash and no vomiting   Illness Severity:  Severe Onset quality:  Sudden Duration:  6 hours Timing:  Constant Progression:  Unchanged Chronicity:  New Associated symptoms: shortness of breath   Associated symptoms: no abdominal pain, no chest pain, no congestion, no diarrhea, no fever, no headaches, no myalgias, no rash and no vomiting     Past Medical History:  Diagnosis Date  . Hearing loss    hearing aids to both ears  . Hypercholesterolemia   . Hypertension   . Overactive bladder   . 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 Stroke [facility])    TIA    Patient Active Problem List   Diagnosis Date Noted  . Comfort measures only status 06/22/2019  . Goals of care, counseling/discussion   . Palliative care by specialist   . End of life care   . Obstructive uropathy 07/07/2019  . Aspiration pneumonia ([place]) 07/07/2019  . Insomnia 07/07/2019  . Hypokalemia 07/07/2019  . Hypomagnesemia 07/07/2019  . Psychosis ([place]) 07/07/2019  . Vascular dementia ([place]) 07/07/2019  . Upper GI bleed 06/30/2019  . Constipation 06/30/2019  . AKI (acute kidney injury) ([place]) 06/30/2019  . Hypotension 06/30/2019  . TIA (transient ischemic attack)  12/23/2017  . Stroke-like symptoms 12/22/2017  . Overactive bladder   . Hypertension   . Hypercholesterolemia   . Dementia ([place])   . Pituitary mass [facility])     Past Surgical History:  Procedure Laterality Date  . CYSTOSCOPY W/ RETROGRADES Bilateral 07/23/2018   Procedure: CYSTOSCOPY WITH RETROGRADE PYELOGRAM;  Surgeon: Ardis Hughs, MD;  Location: [facility] ORS;  Service: Urology;  Laterality: Bilateral;  . EYE SURGERY     bilateral cataract surgery with lens implants  . TRANSURETHRAL RESECTION OF BLADDER TUMOR WITH MITOMYCIN-C N/A 07/23/2018   Procedure: TRANSURETHRAL RESECTION OF BLADDER TUMOR WITH GEMCITABINE;  Surgeon: Ardis Hughs, MD;  Location: [facility]S;  Service: Urology;  Laterality: N/A;  . TRANSURETHRAL RESECTION OF PROSTATE     x 2        Home Medications    Prior to Admission medications   Medication Sig Start Date End Date Taking? Authorizing Provider  acetaminophen (TYLENOL) 650 MG CR tablet Take 650 mg by mouth daily.   Yes [provider]  amoxicillin-clavulanate (AUGMENTIN) 875-125 MG tablet Take 1 tablet by mouth every 12 (twelve) hours for 3 days. Patient taking differently: Take 1 tablet by mouth every 12 (twelve) hours.  07/07/19 07/08/2019 Yes Medina-Vargas, Monina C, NP  bisacodyl (DULCOLAX) 10 MG suppository Place 10 mg rectally daily as needed (for constipation not relieved by Milk of Magnesia).   Yes [provider]  guaiFENesin (MUCINEX) 600 MG 12 hr tablet Take 600 mg by mouth 2 (two) times daily as needed for cough (and congestion).   Yes [provider]  magnesium hydroxide (MILK OF MAGNESIA) 400 MG/5ML suspension Take 30 mLs by mouth daily as needed for mild constipation.   Yes [provider]  magnesium oxide (MAG-OX) 400 (241.3 Mg) MG tablet Take 1 tablet (400 mg total) by mouth daily. 07/06/19  Yes Nita Sells, MD  memantine (NAMENDA) 10 MG tablet Take 10 mg by mouth at bedtime.   Yes [provider]  metoprolol tartrate (LOPRESSOR) 25 MG tablet Take 0.5 tablets (12.5 mg total) by mouth daily. Hold for SBP <110 and HR <60 07/07/19  Yes Medina-Vargas, Monina C, NP  Multiple Vitamins-Minerals (CENTRUM SILVER 50+MEN PO) Take 1 tablet by mouth daily.   Yes [provider]  Multiple Vitamins-Minerals (PRESERVISION AREDS 2+MULTI VIT PO) Take 1 capsule by mouth 2 (two) times daily.   Yes [provider]  pantoprazole (PROTONIX) 40 MG tablet Take 1 tablet (40 mg total) by mouth 2 (two) times daily. Patient taking differently: Take 40 mg by mouth 2 (two) times daily.  07/06/19  Yes Nita Sells, MD  potassium chloride SA (KLOR-CON) 20 MEQ tablet Take 1 tablet (20 mEq total) by mouth daily. 07/07/19  Yes Medina-Vargas, Monina C, NP  risperiDONE (RISPERDAL) 0.25 MG tablet Take 1 tablet (0.25 mg total) by mouth every evening. Patient taking differently: Take 0.25 mg by mouth See admin instructions. Take 0.25 mg by mouth daily at 4:30 PM 07/07/19  Yes Medina-Vargas, Monina C, NP  Sodium Phosphates (FLEET ENEMA RE) Place 1 enema rectally daily as needed (for constipation not relieved by a Dulcolax suppository and call MD if no relief from the enema).   Yes [provider]  tamsulosin (FLOMAX) 0.4 MG CAPS capsule Take 0.4 mg by mouth daily with supper.    Yes [provider]  traZODone (DESYREL) 50 MG tablet Take 0.5 tablets (25 mg total) by mouth at bedtime. 07/06/19  Yes Nita Sells, MD  guaiFENesin (ROBITUSSIN) 100 MG/5ML liquid Take 200 mg by mouth 2 (two) times daily as needed for cough or congestion.    [provider]    Family History Family History  Problem Relation Age of Onset  . Hypertension Mother   . Hypertension Father     Social History Social History   Tobacco Use  . Smoking status: Former Smoker    Types: Pipe  . Smokeless tobacco: Never Used  Substance Use Topics  . Alcohol use: Not Currently  . Drug  use: Not Currently     Allergies   Patient has no known allergies.   Review of Systems Review of Systems  Unable to perform ROS: Dementia  Constitutional: Negative for chills and fever.  HENT: Negative for congestion and facial swelling.   Eyes: Negative for discharge and visual disturbance.  Respiratory: Positive for shortness of breath.   Cardiovascular: Negative for chest pain and palpitations.  Gastrointestinal: Negative for abdominal pain, diarrhea and vomiting.  Musculoskeletal: Negative for arthralgias and myalgias.  Skin: Negative for color change and rash.  Neurological: Negative for tremors, syncope and headaches.  Psychiatric/Behavioral: Negative for confusion and dysphoric mood.    Physical Exam Updated Vital Signs BP (!) 75/42   Pulse 81   Resp 16   SpO2 95%   Physical Exam Vitals signs and nursing note reviewed.  Constitutional:      Appearance: He is well-developed.  Comments: Pallor  HENT:     Head: Normocephalic and atraumatic.  Eyes:     Pupils: Pupils are equal, round, and reactive to light.  Neck:     Musculoskeletal: Normal range of motion and neck supple.     Vascular: No JVD.  Cardiovascular:     Rate and Rhythm: Normal rate and regular rhythm.     Heart sounds: No murmur. No friction rub. No gallop.   Pulmonary:     Effort: No respiratory distress.     Breath sounds: No wheezing.     Comments: Coarse Breath Sounds in [place] tachypnea Abdominal:     General: There is no distension.     Tenderness: There is no guarding or rebound.  Musculoskeletal: Normal range of motion.  Skin:    Coloration: Skin is not pale.     Findings: No rash.  Neurological:     Mental Status: He is alert and oriented to person, place, and time.  Psychiatric:        Behavior: Behavior normal.      ED Treatments / Results  Labs (all labs ordered are listed, but only abnormal results are displayed) Labs Reviewed  CBC WITH DIFFERENTIAL/PLATELET -  Abnormal; Notable for the following components:      Result Value   WBC 14.7 (*)    RBC 2.89 (*)    Hemoglobin 8.6 (*)    HCT 26.8 (*)    RDW 16.1 (*)    Neutro Abs 8.8 (*)    Lymphs Abs 4.4 (*)    Abs Immature Granulocytes 0.27 (*)    All other components within normal limits  COMPREHENSIVE METABOLIC PANEL - Abnormal; Notable for the following components:   Sodium 134 (*)    CO2 16 (*)    Glucose, Bld 195 (*)    Calcium 8.2 (*)    Total Protein 5.3 (*)    Albumin 2.1 (*)    GFR calc non Af Amer 55 (*)    All other components within normal limits  LACTIC ACID, PLASMA - Abnormal; Notable for the following components:   Lactic Acid, Venous 4.9 (*)    All other components within normal limits  POCT I-STAT EG7 - Abnormal; Notable for the following components:   pH, Ven 7.449 (*)    pCO2, Ven 23.1 (*)    pO2, Ven 145.0 (*)    Bicarbonate 16.0 (*)    TCO2 17 (*)    Acid-base deficit 7.0 (*)    Sodium 132 (*)    Calcium, Ion 0.99 (*)    HCT 24.0 (*)    Hemoglobin 8.2 (*)    All other components within normal limits  TROPONIN I (HIGH SENSITIVITY) - Abnormal; Notable for the following components:   Troponin I (High Sensitivity) 61 (*)    All other components within normal limits  POC SARS CORONAVIRUS 2 AG -  ED    EKG EKG Interpretation  Date/Time:  Saturday July 10 2019 09:13:08 EST Ventricular Rate:  95 PR Interval:    QRS Duration: 123 QT Interval:  371 QTC Calculation: 467 R Axis:   -59 Text Interpretation: Sinus rhythm Prolonged PR interval Probable left atrial enlargement RBBB and LAFB Minimal ST elevation, lateral leads Baseline wander in lead(s) V5 No significant change since last tracing Confirmed by Deno Etienne [phone]) on 06/27/2019 9:23:06 AM   Radiology Dg Chest Port 1 View  Result Date: 06/16/2019 CLINICAL DATA:  Shortness of breath EXAM: PORTABLE CHEST 1 VIEW  COMPARISON:  July 02, 2019 FINDINGS: There is mild atelectatic change in the left base  with small left pleural effusion. The lungs elsewhere are clear. There is an azygos lobe on the right, an anatomic variant. Heart is upper normal in size with pulmonary vascularity normal. No adenopathy. There is aortic atherosclerosis. Bones are osteoporotic. IMPRESSION: Small left pleural effusion with mild left base atelectasis. No edema or consolidation. Heart upper normal in size. Aortic Atherosclerosis (ICD10-I70.0). Electronically Signed   By: Lowella Grip III M.D.   On: 06/20/2019 10:23    Procedures Procedures (including critical care time) Procedure note: Ultrasound Guided Peripheral IV Ultrasound guided peripheral 1.88 inch angiocath IV placement performed by me. Indications: Nursing unable to place IV. Details: The antecubital fossa and upper arm were evaluated with a multifrequency linear probe. Patent brachial veins were noted. 1 attempt was made to cannulate a vein under realtime US guidance with successful cannulation of the vein and catheter placement. There is return of non-pulsatile dark red blood. The patient tolerated the procedure well without complications. Images archived electronically.  CPT codes: [phone] and [phone] Procedure note: Ultrasound Guided Peripheral IV Ultrasound guided peripheral 1.88 inch angiocath IV placement performed by me. Indications: Nursing unable to place IV. Details: The antecubital fossa and upper arm were evaluated with a multifrequency linear probe. Patent brachial veins were noted. 1 attempt was made to cannulate a vein under realtime US guidance with successful cannulation of the vein and catheter placement. There is return of non-pulsatile dark red blood. The patient tolerated the procedure well without complications. Images archived electronically.  CPT codes: [phone] and [phone]  Medications Ordered in ED Medications  ondansetron (ZOFRAN-ODT) disintegrating tablet 4 mg (has no administration in time range)    Or  ondansetron (ZOFRAN) injection  4 mg (has no administration in time range)  senna (SENOKOT) tablet 8.6 mg (has no administration in time range)  bisacodyl (DULCOLAX) suppository 10 mg (has no administration in time range)  sodium phosphate (FLEET) 7-19 GM/118ML enema 1 enema (has no administration in time range)  LORazepam (ATIVAN) tablet 1 mg (has no administration in time range)    Or  LORazepam (ATIVAN) 2 MG/ML concentrated solution 1 mg (has no administration in time range)    Or  LORazepam (ATIVAN) injection 1 mg (has no administration in time range)  haloperidol (HALDOL) tablet 0.5 mg (has no administration in time range)    Or  haloperidol (HALDOL) 2 MG/ML solution 0.5 mg (has no administration in time range)    Or  haloperidol lactate (HALDOL) injection 0.5 mg (has no administration in time range)  acetaminophen (TYLENOL) tablet 650 mg (has no administration in time range)    Or  acetaminophen (TYLENOL) suppository 650 mg (has no administration in time range)  morphine CONCENTRATE 10 MG/0.5ML oral solution 5 mg (has no administration in time range)    Or  morphine CONCENTRATE 10 MG/0.5ML oral solution 5 mg (has no administration in time range)  glycopyrrolate (ROBINUL) tablet 1 mg (has no administration in time range)    Or  glycopyrrolate (ROBINUL) injection 0.2 mg (has no administration in time range)    Or  glycopyrrolate (ROBINUL) injection 0.2 mg (has no administration in time range)  morphine 100mg  in NS 131mL (1mg /mL) infusion - premix (2 mg/hr Intravenous Rate/Dose Change 07/12/2019 1447)  antiseptic oral rinse (BIOTENE) solution 15 mL (has no administration in time range)  polyvinyl alcohol (LIQUIFILM TEARS) 1.4 % ophthalmic solution 1 drop (has no administration in time  range)  sodium chloride 0.9 % bolus 1,000 mL (0 mLs Intravenous Stopped 06/22/2019 1300)     Initial Impression / Assessment and Plan / ED Course  I have reviewed the triage vital signs and the nursing notes.  Pertinent labs &  imaging results that were available during my care of the patient were reviewed by me and considered in my medical decision making (see chart for details).        85 yo M with a chief complaint of shortness of breath.  This apparently started this morning.  Patient is being some gurgling from the back of the airway seems most likely the patient aspirated this morning.  Currently on nonrebreather.  He is a DNR.  Confirmed with family.  Will obtain a laboratory evaluation chest x-ray.  Patient is also notably hypotensive with a blood pressure in the 60s on arrival.  We will give a bolus of IV fluids and reassess.  Nursing having difficulty with IV access.  Bilateral 18 ultrasound-guided IVs were placed.  Code sepsis was not initiated due to the family wishes.  Patient has had some improvement with IV fluids.  Still hypotensive.  Still minimally responsive.  The family has arrived and they would prefer to make the patient comfortable overall things.  They would not like to give antibiotics at this time.  Would hold off on IV fluids.  They are not sure that they want hospice to bring the patient home.  They are okay with the patient coming into the hospital.  Will discuss with the hospitalist.  CRITICAL CARE Performed by: Cecilio Asper   Total critical care time: 80 minutes  Critical care time was exclusive of separately billable procedures and treating other patients.  Critical care was necessary to treat or prevent imminent or life-threatening deterioration.  Critical care was time spent personally by me on the following activities: development of treatment plan with patient and/or surrogate as well as nursing, discussions with consultants, evaluation of patient's response to treatment, examination of patient, obtaining history from patient or surrogate, ordering and performing treatments and interventions, ordering and review of laboratory studies, ordering and review of radiographic  studies, pulse oximetry and re-evaluation of patient's condition.  The patients results and plan were reviewed and discussed.   Any x-rays performed were independently reviewed by myself.   Differential diagnosis were considered with the presenting HPI.  Medications  ondansetron (ZOFRAN-ODT) disintegrating tablet 4 mg (has no administration in time range)    Or  ondansetron (ZOFRAN) injection 4 mg (has no administration in time range)  senna (SENOKOT) tablet 8.6 mg (has no administration in time range)  bisacodyl (DULCOLAX) suppository 10 mg (has no administration in time range)  sodium phosphate (FLEET) 7-19 GM/118ML enema 1 enema (has no administration in time range)  LORazepam (ATIVAN) tablet 1 mg (has no administration in time range)    Or  LORazepam (ATIVAN) 2 MG/ML concentrated solution 1 mg (has no administration in time range)    Or  LORazepam (ATIVAN) injection 1 mg (has no administration in time range)  haloperidol (HALDOL) tablet 0.5 mg (has no administration in time range)    Or  haloperidol (HALDOL) 2 MG/ML solution 0.5 mg (has no administration in time range)    Or  haloperidol lactate (HALDOL) injection 0.5 mg (has no administration in time range)  acetaminophen (TYLENOL) tablet 650 mg (has no administration in time range)    Or  acetaminophen (TYLENOL) suppository 650 mg (has no administration in  time range)  morphine CONCENTRATE 10 MG/0.5ML oral solution 5 mg (has no administration in time range)    Or  morphine CONCENTRATE 10 MG/0.5ML oral solution 5 mg (has no administration in time range)  glycopyrrolate (ROBINUL) tablet 1 mg (has no administration in time range)    Or  glycopyrrolate (ROBINUL) injection 0.2 mg (has no administration in time range)    Or  glycopyrrolate (ROBINUL) injection 0.2 mg (has no administration in time range)  morphine 100mg  in NS 155mL (1mg /mL) infusion - premix (2 mg/hr Intravenous Rate/Dose Change 07/09/2019 1447)  antiseptic oral  rinse (BIOTENE) solution 15 mL (has no administration in time range)  polyvinyl alcohol (LIQUIFILM TEARS) 1.4 % ophthalmic solution 1 drop (has no administration in time range)  sodium chloride 0.9 % bolus 1,000 mL (0 mLs Intravenous Stopped 07/02/2019 1300)    Vitals:   07/04/2019 1439 06/16/2019 1445 06/21/2019 1446 06/28/2019 1500  BP:    (!) 75/42  Pulse: 81 81 81   Resp: 15 13 13 16   SpO2: 94% 96% 95%     Final diagnoses:  Aspiration pneumonia of left lower lobe due to gastric secretions [facility])    Admission/ observation were discussed with the admitting physician, patient and/or family and they are comfortable with the plan.   Final Clinical Impressions(s) / ED Diagnoses   Final diagnoses:  Aspiration pneumonia of left lower lobe due to gastric secretions [facility])    ED Discharge Orders    None       Deno Etienne, DO 07/09/2019 1526

## 2019-07-10 NOTE — Consult Note (Signed)
Consultation Note Date: 06/16/2019   Patient Name: Billy [name]  DOB: 24-Oct-1926  MRN: [id number]  Age / Sex: 85 y.o., male  PCP: Reymundo Poll, MD Referring Physician: Deno Etienne, DO  Reason for Consultation: Establishing goals of care, Terminal Care and Withdrawal of life-sustaining treatment  HPI/Patient Profile: 85 y.o. male  with past medical history of *HTN, HLD, dementia, TIA, and bladder and prostate cancer s/p transurethral resection, resident of [facility] SNF admitted on 06/21/2019 with severe sepsis secondary to aspiration pneumonia.   Clinical Assessment and Goals of Care: Conference with the ED physician and bedside nursing staff.  Meeting with son, Dewin Coones, at bedside.  Sherre Lain shares a story of decline over the last 6 months.  He shares that his father was hospitalized last week and family were told that he was nearing end-of-life.  [name] states that his father has very poor quality of life and family would like to focus on comfort only, let nature take its course.   We talked about unburdening Mr. Beaupre from medications and treatments, including oxygen, that are not changing what is happening.  I share that modern medicine can prolong life, but probably long the dying process and thereby prolong suffering.  [name] states understanding and acceptance.   In hospital death would not be surprising.  Son [name] states understanding.  Detail conference with attending and bedside nursing staff related to patient condition, needs, comfort measures.  HCPOA   NEXT OF KIN - Bert Chizek.  [name] is also in contact with his 2 sisters, Bethena Roys and Sharee Pimple.    SUMMARY OF RECOMMENDATIONS   FULL COMFORT CARE LET NATURE TAKE IT'S COURSE   Code Status/Advance Care Planning:  DNR  Symptom Management:   Morphine cont infusion for comfort/breathlessness  Palliative Prophylaxis:   Frequent Pain Assessment and  Turn Reposition  Additional Recommendations (Limitations, Scope, Preferences):  Full Comfort Care  Psycho-social/Spiritual:   Desire for further Chaplaincy support:no  Additional Recommendations: Caregiving  Support/Resources and Education on Hospice  Prognosis:   Hours - Days  Discharge Planning: Anticipated Hospital Death      Primary Diagnoses: Present on Admission: **None**   I have reviewed the medical record, interviewed the patient and family, and examined the patient. The following aspects are pertinent.  Past Medical History:  Diagnosis Date  . Hearing loss    hearing aids to both ears  . Hypercholesterolemia   . Hypertension   . Overactive bladder   . 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 Stroke [facility])    TIA   Social History   Socioeconomic History  . Marital status: Widowed    Spouse name: Not on file  . Number of children: Not on file  . Years of education: Not on file  . Highest education level: Not on file  Occupational History  . Not on file  Social Needs  . Financial resource strain: Not on file  . Food insecurity    Worry: Not on file    Inability: Not on file  . Transportation  needs    Medical: Not on file    Non-medical: Not on file  Tobacco Use  . Smoking status: Former Smoker    Types: Pipe  . Smokeless tobacco: Never Used  Substance and Sexual Activity  . Alcohol use: Not Currently  . Drug use: Not Currently  . Sexual activity: Not on file  Lifestyle  . Physical activity    Days per week: Not on file    Minutes per session: Not on file  . Stress: Not on file  Relationships  . Social Herbalist on phone: Not on file    Gets together: Not on file    Attends religious service: Not on file    Active member of club or organization: Not on file    Attends meetings of clubs or organizations: Not on file    Relationship status: Not on file  Other Topics Concern  . Not on file  Social History Narrative  . Not on file   Family  History  Problem Relation Age of Onset  . Hypertension Mother   . Hypertension Father    Scheduled Meds: Continuous Infusions: . morphine    . sodium chloride     PRN Meds:.acetaminophen **OR** acetaminophen, bisacodyl, glycopyrrolate **OR** glycopyrrolate **OR** glycopyrrolate, haloperidol **OR** haloperidol **OR** haloperidol lactate, LORazepam **OR** LORazepam **OR** LORazepam, morphine CONCENTRATE **OR** morphine CONCENTRATE, ondansetron **OR** ondansetron (ZOFRAN) IV, senna, sodium phosphate Medications Prior to Admission:  Prior to Admission medications   Medication Sig Start Date End Date Taking? Authorizing Provider  acetaminophen (TYLENOL) 650 MG CR tablet Take 650 mg by mouth daily.   Yes [provider]  amoxicillin-clavulanate (AUGMENTIN) 875-125 MG tablet Take 1 tablet by mouth every 12 (twelve) hours for 3 days. Patient taking differently: Take 1 tablet by mouth every 12 (twelve) hours.  07/07/19 07/07/2019 Yes Medina-Vargas, Monina C, NP  bisacodyl (DULCOLAX) 10 MG suppository Place 10 mg rectally daily as needed (for constipation not relieved by Milk of Magnesia).   Yes [provider]  guaiFENesin (MUCINEX) 600 MG 12 hr tablet Take 600 mg by mouth 2 (two) times daily as needed for cough (and congestion).   Yes [provider]  magnesium hydroxide (MILK OF MAGNESIA) 400 MG/5ML suspension Take 30 mLs by mouth daily as needed for mild constipation.   Yes [provider]  magnesium oxide (MAG-OX) 400 (241.3 Mg) MG tablet Take 1 tablet (400 mg total) by mouth daily. 07/06/19  Yes Nita Sells, MD  memantine (NAMENDA) 10 MG tablet Take 10 mg by mouth at bedtime.   Yes [provider]  metoprolol tartrate (LOPRESSOR) 25 MG tablet Take 0.5 tablets (12.5 mg total) by mouth daily. Hold for SBP <110 and HR <60 07/07/19  Yes Medina-Vargas, Monina C, NP  Multiple Vitamins-Minerals (CENTRUM SILVER 50+MEN PO) Take 1 tablet by mouth daily.    Yes [provider]  Multiple Vitamins-Minerals (PRESERVISION AREDS 2+MULTI VIT PO) Take 1 capsule by mouth 2 (two) times daily.   Yes [provider]  pantoprazole (PROTONIX) 40 MG tablet Take 1 tablet (40 mg total) by mouth 2 (two) times daily. Patient taking differently: Take 40 mg by mouth 2 (two) times daily.  07/06/19  Yes Nita Sells, MD  potassium chloride SA (KLOR-CON) 20 MEQ tablet Take 1 tablet (20 mEq total) by mouth daily. 07/07/19  Yes Medina-Vargas, Monina C, NP  risperiDONE (RISPERDAL) 0.25 MG tablet Take 1 tablet (0.25 mg total) by mouth every evening. Patient taking differently: Take  0.25 mg by mouth See admin instructions. Take 0.25 mg by mouth daily at 4:30 PM 07/07/19  Yes Medina-Vargas, Monina C, NP  Sodium Phosphates (FLEET ENEMA RE) Place 1 enema rectally daily as needed (for constipation not relieved by a Dulcolax suppository and call MD if no relief from the enema).   Yes [provider]  tamsulosin (FLOMAX) 0.4 MG CAPS capsule Take 0.4 mg by mouth daily with supper.    Yes [provider]  traZODone (DESYREL) 50 MG tablet Take 0.5 tablets (25 mg total) by mouth at bedtime. 07/06/19  Yes Nita Sells, MD  guaiFENesin (ROBITUSSIN) 100 MG/5ML liquid Take 200 mg by mouth 2 (two) times daily as needed for cough or congestion.    [provider]   No Known Allergies Review of Systems  Unable to perform ROS: Dementia    Physical Exam Vitals signs and nursing note reviewed.  Constitutional:      General: He is not in acute distress.    Appearance: He is ill-appearing.     Comments: Appears acutely/chronically ill and frail.  HENT:     Head:     Comments: Some temporal wasting Cardiovascular:     Rate and Rhythm: Normal rate.  Pulmonary:     Effort: Pulmonary effort is normal. Tachypnea present.  Abdominal:     Palpations: Abdomen is soft.     Tenderness: There is no guarding.  Musculoskeletal:     Right  lower leg: No edema.     Left lower leg: No edema.  Skin:    General: Skin is warm and dry.  Neurological:     Comments: Known dementia  Psychiatric:     Comments: Calm      Vital Signs: BP (!) 63/25   Pulse (!) 104   Resp (!) 34   SpO2 100%  Pain Scale: FLACC       SpO2: SpO2: 100 % O2 Device:SpO2: 100 % O2 Flow Rate: .   IO: Intake/output summary: No intake or output data in the 24 hours ending 07/05/2019 1347  LBM:   Baseline Weight:   Most recent weight:       Palliative Assessment/Data:   Flowsheet Rows     Most Recent Value  Intake Tab  Referral Department  Hospitalist  Unit at Time of Referral  ER  Palliative Care Primary Diagnosis  Sepsis/Infectious Disease  Date Notified  06/21/2019  Palliative Care Type  New Palliative care  Date of Admission  07/08/2019  Date first seen by Palliative Care  06/25/2019  # of days Palliative referral response time  0 Day(s)  # of days IP prior to Palliative referral  0  Clinical Assessment  Palliative Performance Scale Score  20%  Pain Max last 24 hours  Not able to report  Pain Min Last 24 hours  Not able to report  Dyspnea Max Last 24 Hours  Not able to report  Dyspnea Min Last 24 hours  Not able to report  Psychosocial & Spiritual Assessment  Palliative Care Outcomes      Time In: 1030 Time Out: 1140 Time Total: 70 minutes  Greater than 50%  of this time was spent counseling and coordinating care related to the above assessment and plan.  Signed by: Drue Novel, NP   Please contact Palliative Medicine Team phone at [phone] for questions and concerns.  For individual provider: See Shea Evans

## 2019-07-11 DIAGNOSIS — Z79899 Other long term (current) drug therapy: Secondary | ICD-10-CM | POA: Diagnosis not present

## 2019-07-11 DIAGNOSIS — Z8546 Personal history of malignant neoplasm of prostate: Secondary | ICD-10-CM | POA: Diagnosis not present

## 2019-07-11 DIAGNOSIS — N3281 Overactive bladder: Secondary | ICD-10-CM | POA: Diagnosis present

## 2019-07-11 DIAGNOSIS — J690 Pneumonitis due to inhalation of food and vomit: Secondary | ICD-10-CM | POA: Diagnosis present

## 2019-07-11 DIAGNOSIS — Z87891 Personal history of nicotine dependence: Secondary | ICD-10-CM | POA: Diagnosis not present

## 2019-07-11 DIAGNOSIS — E7800 Pure hypercholesterolemia, unspecified: Secondary | ICD-10-CM | POA: Diagnosis present

## 2019-07-11 DIAGNOSIS — J9811 Atelectasis: Secondary | ICD-10-CM | POA: Diagnosis present

## 2019-07-11 DIAGNOSIS — F0390 Unspecified dementia without behavioral disturbance: Secondary | ICD-10-CM | POA: Diagnosis present

## 2019-07-11 DIAGNOSIS — Z8249 Family history of ischemic heart disease and other diseases of the circulatory system: Secondary | ICD-10-CM | POA: Diagnosis not present

## 2019-07-11 DIAGNOSIS — R0603 Acute respiratory distress: Secondary | ICD-10-CM | POA: Diagnosis present

## 2019-07-11 DIAGNOSIS — Z20828 Contact with and (suspected) exposure to other viral communicable diseases: Secondary | ICD-10-CM | POA: Diagnosis present

## 2019-07-11 DIAGNOSIS — G253 Myoclonus: Secondary | ICD-10-CM

## 2019-07-11 DIAGNOSIS — E871 Hypo-osmolality and hyponatremia: Secondary | ICD-10-CM | POA: Diagnosis present

## 2019-07-11 DIAGNOSIS — E872 Acidosis: Secondary | ICD-10-CM | POA: Diagnosis present

## 2019-07-11 DIAGNOSIS — H9190 Unspecified hearing loss, unspecified ear: Secondary | ICD-10-CM | POA: Diagnosis present

## 2019-07-11 DIAGNOSIS — E785 Hyperlipidemia, unspecified: Secondary | ICD-10-CM | POA: Diagnosis present

## 2019-07-11 DIAGNOSIS — Z8673 Personal history of transient ischemic attack (TIA), and cerebral infarction without residual deficits: Secondary | ICD-10-CM | POA: Diagnosis not present

## 2019-07-11 DIAGNOSIS — G4700 Insomnia, unspecified: Secondary | ICD-10-CM | POA: Diagnosis present

## 2019-07-11 DIAGNOSIS — R6521 Severe sepsis with septic shock: Secondary | ICD-10-CM | POA: Diagnosis present

## 2019-07-11 DIAGNOSIS — Z66 Do not resuscitate: Secondary | ICD-10-CM | POA: Diagnosis present

## 2019-07-11 DIAGNOSIS — J9601 Acute respiratory failure with hypoxia: Secondary | ICD-10-CM | POA: Diagnosis present

## 2019-07-11 DIAGNOSIS — I10 Essential (primary) hypertension: Secondary | ICD-10-CM | POA: Diagnosis present

## 2019-07-11 DIAGNOSIS — Z515 Encounter for palliative care: Secondary | ICD-10-CM | POA: Diagnosis present

## 2019-07-11 DIAGNOSIS — A419 Sepsis, unspecified organism: Secondary | ICD-10-CM | POA: Diagnosis present

## 2019-07-11 DIAGNOSIS — D649 Anemia, unspecified: Secondary | ICD-10-CM | POA: Diagnosis present

## 2019-07-11 DIAGNOSIS — J90 Pleural effusion, not elsewhere classified: Secondary | ICD-10-CM | POA: Diagnosis present

## 2019-07-11 MED ORDER — SODIUM CHLORIDE 0.9 % IV SOLN
0.5000 mg/h | INTRAVENOUS | Status: DC
  Administered 2019-07-11: 11:00:00 0.5 mg/h via INTRAVENOUS
  Filled 2019-07-11 (×2): qty 2.5

## 2019-07-11 MED ORDER — LORAZEPAM 1 MG PO TABS: 1.0000 mg | ORAL_TABLET | ORAL | Status: DC | PRN

## 2019-07-11 MED ORDER — HYDROMORPHONE BOLUS VIA INFUSION
0.5000 mg | INTRAVENOUS | Status: DC | PRN
  Filled 2019-07-11: qty 1

## 2019-07-11 MED ORDER — LORAZEPAM 2 MG/ML IJ SOLN
1.0000 mg | INTRAMUSCULAR | Status: DC | PRN
  Administered 2019-07-11: 1 mg via INTRAVENOUS
  Filled 2019-07-11: qty 1

## 2019-07-11 MED ORDER — HYDROMORPHONE HCL 1 MG/ML IJ SOLN
0.5000 mg | Freq: Once | INTRAMUSCULAR | Status: AC
  Administered 2019-07-11: 0.5 mg via INTRAVENOUS
  Filled 2019-07-11: qty 1

## 2019-07-11 MED ORDER — LORAZEPAM 2 MG/ML PO CONC: 1.0000 mg | ORAL | Status: DC | PRN

## 2019-07-13 NOTE — Progress Notes (Signed)
Pt was unresponsive, no breathing, pulseless, family at the bedside, verifed by another nurse, charge nurse aware.

## 2019-07-13 NOTE — Progress Notes (Signed)
PROGRESS NOTE    Billy [name]  [id number] DOB: 10-08-1926 DOA: 07/04/2019 PCP: Billy Poll, MD   Brief Narrative:  HPI on 06/27/2019 by Dr. Fuller [name] Billy [name] is a 85 y.o. male with medical history significant of HTN, HLD, dementia, TIA, and bladder and prostate cancer s/p transurethral resection of the bladder.  Patient presents after being found to be in acute respiratory distress at the nursing facility.  Much of history is obtained from son who is present at bedside and review of records as he has history of dementia and currently nonverbal.  He had just been hospitalized from 11/18-11/24, for upper GI bleed.  CT imaging had showed signs of a bezoar versus large stool burden.  GI was consulted and hemoglobin trended down from 11 down to 8.4, but appeared stabilized.  No endoscopic evaluation was performed.  Patient was discharged to [facility] for rehab.  Son notes that during his last hospitalization he was advised to patient being near end-of-life.  The patient also had previously been admitted in ICU once before with suspected aspiration pneumonia.  At this time he would like to keep the patient comfortable.  Interim history Patient admitted for comfort care measures.  Palliative care consulted and appreciated, expecting hospital death. Assessment & [name]   Acute respiratory distress secondary to possible aspiration event -Chest x-ray showed small left-sided pleural effusion with left base atelectasis, no clear consolidation was noted -Son was at bedside during admission and confirmed patient is DNR.  Reported at last admission, patient appeared to be in the active process of dying. -Patient with poor overall prognosis  Sepsis/Shock secondary to possible aspiration event -As above patient presented with acute respiratory distress, was noted to have leukocytosis, tachycardia, tachypnea with hypotension, lactic acidosis -No treatment given for sepsis and shock as it was  decided the patient would be transition to comfort care  Goals of care/comfort care -Given poor prognosis and respiratory distress, it was decided patient would be admitted for comfort measures only -Palliative care consulted and appreciated and expects patient to have hospital death -Continue morphine drip, Robinul, Haldol, Ativan as needed for comfort  Chronic normocytic anemia -Hemoglobin currently 8 and appears to be stable  DVT Prophylaxis  None  Code Status: DNR  Family Communication: None at bedside  Disposition [name]: Admitted, expect hospital death  Consultants Palliative care   Procedures  None  Antibiotics   Anti-infectives (From admission, onward)   Start     Dose/Rate Route Frequency Ordered Stop   07/09/2019 1045  vancomycin (VANCOCIN) 1,750 mg in sodium chloride 0.9 % 500 mL IVPB  Status:  Discontinued     1,750 mg 250 mL/hr over 120 Minutes Intravenous  Once 06/18/2019 1043 07/08/2019 1102   06/29/2019 1045  piperacillin-tazobactam (ZOSYN) IVPB 3.375 g  Status:  Discontinued     3.375 g 100 mL/hr over 30 Minutes Intravenous  Once 06/19/2019 1043 06/25/2019 1102      Subjective:   Billy [name] seen and examined today.     Objective:   Vitals:   06/15/2019 1446 06/15/2019 1500 07/10/19 1557 07/28/2019 0530  BP:  (!) 75/42 (!) 83/46 (!) 91/45  Pulse: 81   98  Resp: 13 16 16    Temp:    97.9 F (36.6 C)  TempSrc:    Oral  SpO2: 95%   93%    Intake/Output Summary (Last 24 hours) at 07/28/2019 0932 Last data filed at 2019/07/28 0700 Gross per 24 hour  Intake 1035.99 ml  Output -  Net 1035.99 ml   There were no vitals filed for this visit.  Exam  General: Well developed,chronically ill appearing  HEENT: NCAT,  mucous membranes dry  Cardiovascular: S1 S2 auscultated, RRR  Respiratory: Diminished, coarse breath sounds  Abdomen: Soft, nontender, nondistended, + bowel sounds  Extremities: warm dry without cyanosis clubbing or edema  Data Reviewed: I have  personally reviewed following labs and imaging studies  CBC: Recent Labs  Lab 07/05/19 1210 07/06/19 0221 06/15/2019 0928 06/16/2019 0944  WBC 7.1 8.9 14.7*  --   NEUTROABS 4.5 5.8 8.8*  --   HGB 8.7* 8.4* 8.6* 8.2*  HCT 25.9* 24.6* 26.8* 24.0*  MCV 89.0 87.9 92.7  --   PLT 213 216 365  --    Basic Metabolic Panel: Recent Labs  Lab 07/04/19 1110 07/06/19 0221 06/22/2019 0928 06/27/2019 0944  NA 146* 141 134* 132*  K 2.6* 3.3* 4.6 4.6  CL 119* 115* 103  --   CO2 21* 19* 16*  --   GLUCOSE 116* 121* 195*  --   BUN 20 13 18   --   CREATININE 0.61 0.64 1.16  --   CALCIUM 8.1* 7.8* 8.2*  --   MG  --  1.5*  --   --   PHOS 2.6 3.3  --   --    GFR: Estimated Creatinine Clearance: 46.5 mL/min (by C-G formula based on SCr of 1.16 mg/dL). Liver Function Tests: Recent Labs  Lab 07/04/19 1110 07/05/2019 0928  AST  --  28  ALT  --  18  ALKPHOS  --  101  BILITOT  --  0.7  PROT  --  5.3*  ALBUMIN 2.1* 2.1*   No results for input(s): LIPASE, AMYLASE in the last 168 hours. No results for input(s): AMMONIA in the last 168 hours. Coagulation Profile: No results for input(s): INR, PROTIME in the last 168 hours. Cardiac Enzymes: No results for input(s): CKTOTAL, CKMB, CKMBINDEX, TROPONINI in the last 168 hours. BNP (last 3 results) No results for input(s): PROBNP in the last 8760 hours. HbA1C: No results for input(s): HGBA1C in the last 72 hours. CBG: Recent Labs  Lab 07/05/19 1200  GLUCAP 138*   Lipid Profile: No results for input(s): CHOL, HDL, LDLCALC, TRIG, CHOLHDL, LDLDIRECT in the last 72 hours. Thyroid Function Tests: No results for input(s): TSH, T4TOTAL, FREET4, T3FREE, THYROIDAB in the last 72 hours. Anemia Panel: No results for input(s): VITAMINB12, FOLATE, FERRITIN, TIBC, IRON, RETICCTPCT in the last 72 hours. Urine analysis:    Component Value Date/Time   COLORURINE AMBER (A) 07/01/2019 0104   APPEARANCEUR HAZY (A) 07/01/2019 0104   LABSPEC 1.031 (H) 07/01/2019  0104   PHURINE 5.0 07/01/2019 0104   GLUCOSEU NEGATIVE 07/01/2019 0104   HGBUR NEGATIVE 07/01/2019 0104   BILIRUBINUR NEGATIVE 07/01/2019 0104   KETONESUR NEGATIVE 07/01/2019 0104   PROTEINUR NEGATIVE 07/01/2019 0104   NITRITE NEGATIVE 07/01/2019 0104   LEUKOCYTESUR NEGATIVE 07/01/2019 0104   Sepsis Labs: @LABRCNTIP (procalcitonin:4,lacticidven:4)  ) Recent Results (from the past 240 hour(s))  MRSA PCR Screening     Status: None   Collection Time: 07/01/19 12:56 PM   Specimen: Nasopharyngeal  Result Value Ref Range Status   MRSA by PCR NEGATIVE NEGATIVE Final    Comment:        The GeneXpert MRSA Assay (FDA approved for NASAL specimens only), is one component of a comprehensive MRSA colonization surveillance program. It is not intended to diagnose MRSA infection nor to guide or monitor treatment  for MRSA infections. Performed at [facility], [place] [address], [place], [place] [address]   C difficile quick scan w PCR reflex     Status: None   Collection Time: 07/02/19  5:40 PM   Specimen: STOOL  Result Value Ref Range Status   C Diff antigen NEGATIVE NEGATIVE Final   C Diff toxin NEGATIVE NEGATIVE Final   C Diff interpretation No C. difficile detected.  Final    Comment: Performed at [facility], [place] [address] [place], [place] [address]  SARS CORONAVIRUS 2 (TAT 6-24 HRS) Nasopharyngeal Nasopharyngeal Swab     Status: None   Collection Time: 07/06/19  7:38 AM   Specimen: Nasopharyngeal Swab  Result Value Ref Range Status   SARS Coronavirus 2 NEGATIVE NEGATIVE Final    Comment: (NOTE) SARS-CoV-2 target nucleic acids are NOT DETECTED. The SARS-CoV-2 RNA is generally detectable in upper and lower respiratory specimens during the acute phase of infection. Negative results do not preclude SARS-CoV-2 infection, do not rule out co-infections with other pathogens, and should not be used as the sole basis for treatment or other patient management decisions.  Negative results must be combined with clinical observations, patient history, and epidemiological information. The expected result is Negative. Fact Sheet for Patients: SugarRoll.be Fact Sheet for Healthcare Providers: https://www.woods-mathews.com/ This test is not yet approved or cleared by the [place] FDA and  has been authorized for detection and/or diagnosis of SARS-CoV-2 by FDA under an Emergency Use Authorization (EUA). This EUA will remain  in effect (meaning this test can be used) for the duration of the COVID-19 declaration under Section 56 4(b)(1) of the Act, 21 U.S.C. section 360bbb-3(b)(1), unless the authorization is terminated or revoked sooner. Performed at [facility], [place] [address], [place], [place] [address]       Radiology Studies: Dg Chest Port 1 View  Result Date: 07/10/2019 CLINICAL DATA:  Shortness of breath EXAM: PORTABLE CHEST 1 VIEW COMPARISON:  July 02, 2019 FINDINGS: There is mild atelectatic change in the left base with small left pleural effusion. The lungs elsewhere are clear. There is an azygos lobe on the right, an anatomic variant. Heart is upper normal in size with pulmonary vascularity normal. No adenopathy. There is aortic atherosclerosis. Bones are osteoporotic. IMPRESSION: Small left pleural effusion with mild left base atelectasis. No edema or consolidation. Heart upper normal in size. Aortic Atherosclerosis (ICD10-I70.0). Electronically Signed   By: Lowella Grip III M.D.   On: 07/10/2019 10:23     Scheduled Meds: Continuous Infusions: . morphine 2 mg/hr (07/12/2019 0700)     LOS: 0 days   Time Spent in minutes   45 minutes  Ahjanae Cassel D.O. on 2019-07-12 at 9:32 AM  Between 7am to 7pm - Please see pager noted on [email or web address]  After 7pm go to [email or web address]  And look for the night coverage person covering for me after hours  [facility] Office   [phone]

## 2019-07-13 NOTE — Progress Notes (Signed)
Pt removed all the lines and wasted dilaudid drip with another nurse.

## 2019-07-13 NOTE — Progress Notes (Signed)
Pt died [place] donor informed with referral no 06/16/2019-065 I spoke to Katy Apo will transfer the body to morgue.

## 2019-07-13 NOTE — Progress Notes (Signed)
Pt waiting for death certificate before we transfer to morgue.

## 2019-07-13 NOTE — Death Summary Note (Signed)
DEATH SUMMARY   Patient Details  Name: Billy [name] MRN: [id number] DOB: 1926-12-21  Admission/Discharge Information   Admit Date:  2019/07/13  Date of Death: Date of Death: 07-14-2019  Time of Death: Time of Death: Nov 07, 1555  Length of Stay: 1  Referring Physician: Reymundo Poll, MD   Reason(s) for Hospitalization  Acute hypoxic respiratory failure  Sepsis and shock Comfort care  Diagnoses  Preliminary cause of death: Acute hypoxic respiratory failure secondary to aspiration Secondary Diagnoses (including complications and co-morbidities):  Principal Problem:   Comfort measures only status Active Problems:   Hypertension   Dementia ([place])   Hypotension   Aspiration pneumonia ([place])   Terminal care   Severe sepsis ([place])   Lactic acidosis   Elevated troponin   Myoclonus   Brief Hospital Course (including significant findings, care, treatment, and services provided and events leading to death)  on 07-13-19 by Dr. Luiz Blare Andiais a 85 y.o.malewith medical history significant ofHTN, HLD, dementia, TIA, and bladder and prostate cancers/ptransurethral resection of the bladder. Patient presents after being found to be in acute respiratory distress at the nursing facility. Much of history is obtained from son who is present at bedside and review of records as he has history of dementia and currently nonverbal. He had just been hospitalized from 11/18-11/24, for upper GI bleed. CT imaging had showed signs of a bezoar versus large stool burden. GI was consulted and hemoglobin trended down from 11 down to 8.4, but appeared stabilized. No endoscopic evaluation was performed. Patient was discharged to [facility] for rehab.Son notes that during his last hospitalization he was advised to patient being near end-of-life. The patient also had previously been admitted in ICU once before with suspected aspiration pneumonia. At this time he would like to keep the patient  comfortable.  Patient was found to have acute respiratory failure secondary to possible aspiration as as septic shock. It was decided as above to admit him for comfort care. Palliative care was consulted and hospital death was expected.  Assessment and Plan Acute respiratory distress secondary to possible aspiration event -Chest x-ray showed small left-sided pleural effusion with left base atelectasis, no clear consolidation was noted -Son was at bedside during admission and confirmed patient is DNR.  Reported at last admission, patient appeared to be in the active process of dying. -Patient with poor overall prognosis  Sepsis/Shock secondary to possible aspiration event -As above patient presented with acute respiratory distress, was noted to have leukocytosis, tachycardia, tachypnea with hypotension, lactic acidosis -No treatment given for sepsis and shock as it was decided the patient would be transition to comfort care  Goals of care/comfort care -Given poor prognosis and respiratory distress, it was decided patient would be admitted for comfort measures only -Palliative care consulted and appreciated and expects patient to have hospital death -Patient was placed on a morphine drip however developed myoclonus and was then was transitioned to a dilaudid drip -Was also placedon Robinul, Haldol, Ativan as needed for comfort  Chronic normocytic anemia -Hemoglobin was stable  Pertinent Labs and Studies  Significant Diagnostic Studies Ct Abdomen Wo Contrast  Result Date: 07/03/2019 CLINICAL DATA:  85 year old male with GI bleeding and gastric outlet obstruction EXAM: CT ABDOMEN WITHOUT CONTRAST TECHNIQUE: Multidetector CT imaging of the abdomen was performed following the standard protocol without IV contrast. COMPARISON:  Prior CT scan of the abdomen and pelvis 06/30/2019 FINDINGS: Lower chest: The heart is normal in size. No pericardial effusion. Extensive calcifications in the native  coronary  arteries. Calcification of the mitral valve annulus. Small bilateral pleural effusions and associated bibasilar atelectasis. Hepatobiliary: High attenuation material layers within the gallbladder lumen. Normal hepatic contour and morphology. No evidence of biliary ductal dilatation or hepatic lesion. Pancreas: Unremarkable. No pancreatic ductal dilatation or surrounding inflammatory changes. Spleen: Normal in size without focal abnormality. Adrenals/Urinary Tract: Adrenal glands are normal. No evidence of hydronephrosis or nephrolithiasis. The visualized ureters are unremarkable. Stomach/Bowel: Incompletely imaged focal bowel wall thickening involving the sigmoid colon in the low mid abdomen at the level of the umbilicus. There is associated inflammatory stranding in the adjacent mesentery. Vascular/Lymphatic: Limited evaluation in the absence of intravenous contrast. Extensive atherosclerotic vascular calcifications. Other: No evidence of ascites.  No abdominal wall hernia. Musculoskeletal: No acute fracture or aggressive appearing lytic or blastic osseous lesion. IMPRESSION: 1. Partially imaged inflammatory changes involving the sigmoid colon in the low midline abdomen at the level of the umbilicus. The entire colon was not included as the pelvis was not imaged on this CT scan of the abdomen only. Differential considerations include diverticulitis as well as infectious, inflammatory and ischemic colitis. 2. The stomach is decompressed. No imaging findings to suggest gastric outlet obstruction at this time. 3. Additional ancillary findings as above without significant interval change compared to recent prior imaging. Electronically Signed   By: Jacqulynn Cadet M.D.   On: 07/03/2019 12:02   Dg Chest 2 View  Result Date: 07/02/2019 CLINICAL DATA:  Shortness of breath, cough. EXAM: CHEST - 2 VIEW COMPARISON:  March 18 2019. FINDINGS: Stable cardiomediastinal silhouette. Atherosclerosis of thoracic  aorta is noted no pneumothorax is noted. Mild bibasilar subsegmental atelectasis is noted with small bilateral pleural effusions. Bony thorax is unremarkable. IMPRESSION: Mild bibasilar subsegmental atelectasis is noted with probable small pleural effusions. Aortic Atherosclerosis (ICD10-I70.0). Electronically Signed   By: Marijo Conception M.D.   On: 07/02/2019 11:24   Dg Abd 1 View  Result Date: 07/02/2019 CLINICAL DATA:  Partial gastric outlet obstruction. EXAM: ABDOMEN - 1 VIEW COMPARISON:  June 30, 2019. FINDINGS: The bowel gas pattern is normal. No radio-opaque calculi or other significant radiographic abnormality are seen. IMPRESSION: No evidence of bowel obstruction or ileus. Electronically Signed   By: Marijo Conception M.D.   On: 07/02/2019 11:25   Ct Abdomen Pelvis W Contrast  Result Date: 06/30/2019 CLINICAL DATA:  Hematemesis. Generalized weakness and diarrhea. EXAM: CT ABDOMEN AND PELVIS WITH CONTRAST TECHNIQUE: Multidetector CT imaging of the abdomen and pelvis was performed using the standard protocol following bolus administration of intravenous contrast. CONTRAST:  123mL OMNIPAQUE IOHEXOL 300 MG/ML  SOLN COMPARISON:  Abdominal radiographs dated 06/30/2019 FINDINGS: Lower chest: Elevation of the left hemidiaphragm. Bronchiectasis at the lung bases with slight accentuation of the interstitial markings and slight tree in bud densities at the right lung base. Slight atelectasis at the left lung base with pleural calcification at the left lung base posteriorly. No discrete effusion. Hepatobiliary: Liver parenchyma is normal. Dependent subtle densities in the gallbladder probably represent tiny stones. The gallbladder is not distended. No gallbladder wall thickening. Biliary tree is normal. Pancreas: Unremarkable. No pancreatic ductal dilatation or surrounding inflammatory changes. Spleen: Normal in size without focal abnormality. Adrenals/Urinary Tract: Adrenal glands are normal. No  significant abnormality of the kidneys. No hydronephrosis. Bladder is normal. Stomach/Bowel: The stomach is distended with fluid. There is a slightly higher density material in the stomach which may represent ingested food or a bezoar or clot. The duodenum and small bowel are normal. Appendix  is normal. Large amount of stool in the rectum. Moderate stool in the transverse and descending portions of the colon. Vascular/Lymphatic: Aortic atherosclerosis. No enlarged abdominal or pelvic lymph nodes. Reproductive: There is a focal area of enhancement at the base of the bladder to the left of midline measuring 12 x 20 x 7 mm. The patient has a history of resection of a bladder tumor as well as resection of the prostate gland. Other: No abdominal wall hernia or abnormality. No abdominopelvic ascites. Musculoskeletal: No acute or significant osseous findings. IMPRESSION: 1. Distended stomach filled with fluid and high density material which could represent a bezoar or clot. 2. Focal area of enhancement at the base of the bladder to the left of midline consistent with the patient's history of bladder tumor. This could represent scarring or tumor. 3. Large amount of stool in the rectum. 4. Probable tiny gallstones. 5. Elevation of the left hemidiaphragm. 6. Bronchiectasis at the lung bases with slight accentuation of the interstitial markings and tree in bud densities at the right lung base. 7. Aortic Atherosclerosis (ICD10-I70.0).a Electronically Signed   By: Lorriane Shire M.D.   On: 06/30/2019 21:52   Dg Chest Port 1 View  Result Date: 07/04/2019 CLINICAL DATA:  Shortness of breath EXAM: PORTABLE CHEST 1 VIEW COMPARISON:  July 02, 2019 FINDINGS: There is mild atelectatic change in the left base with small left pleural effusion. The lungs elsewhere are clear. There is an azygos lobe on the right, an anatomic variant. Heart is upper normal in size with pulmonary vascularity normal. No adenopathy. There is aortic  atherosclerosis. Bones are osteoporotic. IMPRESSION: Small left pleural effusion with mild left base atelectasis. No edema or consolidation. Heart upper normal in size. Aortic Atherosclerosis (ICD10-I70.0). Electronically Signed   By: Lowella Grip III M.D.   On: 07/07/2019 10:23   Dg Abdomen Acute W/chest  Result Date: 06/30/2019 CLINICAL DATA:  GI bleed.  Concern for perforation. EXAM: DG ABDOMEN ACUTE W/ 1V CHEST COMPARISON:  03/18/2019 chest radiograph. FINDINGS: Stable cardiomediastinal silhouette with top-normal heart size. No pneumothorax. No pleural effusion. No pulmonary edema. Low lung volumes. Mild bibasilar scarring versus atelectasis. Questionable lucency under the left hemidiaphragm on the erect portable chest radiograph. No evidence of pneumatosis or pneumoperitoneum on the left lateral decubitus views. Relatively gasless abdomen with no dilated small bowel loops. Fluid levels scattered in the large bowel. No radiopaque nephrolithiasis. Degenerative changes in the lumbar spine. IMPRESSION: 1. Questionable lucency under the left hemidiaphragm on the upright portable chest radiograph, although with no evidence of pneumatosis or pneumoperitoneum on the left lateral decubitus views. If there is continued clinical concern for free air, dedicated CT of the abdomen and pelvis with oral and IV contrast would be recommended for further evaluation. 2. Low lung volumes with mild bibasilar scarring versus atelectasis. 3. Relatively gasless abdomen with no specific findings of bowel obstruction. Scattered fluid levels in the large bowel. These results were called by telephone at the time of interpretation on 06/30/2019 at 8:59 pm to provider PA [facility] , who verbally acknowledged these results. Electronically Signed   By: Ilona Sorrel M.D.   On: 06/30/2019 20:53    Microbiology Recent Results (from the past 240 hour(s))  C difficile quick scan w PCR reflex     Status: None   Collection Time:  07/02/19  5:40 PM   Specimen: STOOL  Result Value Ref Range Status   C Diff antigen NEGATIVE NEGATIVE Final   C Diff toxin NEGATIVE  NEGATIVE Final   C Diff interpretation No C. difficile detected.  Final    Comment: Performed at [facility], [place] [address], [place], [place] [address]  SARS CORONAVIRUS 2 (TAT 6-24 HRS) Nasopharyngeal Nasopharyngeal Swab     Status: None   Collection Time: 07/06/19  7:38 AM   Specimen: Nasopharyngeal Swab  Result Value Ref Range Status   SARS Coronavirus 2 NEGATIVE NEGATIVE Final    Comment: (NOTE) SARS-CoV-2 target nucleic acids are NOT DETECTED. The SARS-CoV-2 RNA is generally detectable in upper and lower respiratory specimens during the acute phase of infection. Negative results do not preclude SARS-CoV-2 infection, do not rule out co-infections with other pathogens, and should not be used as the sole basis for treatment or other patient management decisions. Negative results must be combined with clinical observations, patient history, and epidemiological information. The expected result is Negative. Fact Sheet for Patients: SugarRoll.be Fact Sheet for Healthcare Providers: https://www.woods-mathews.com/ This test is not yet approved or cleared by the [place] FDA and  has been authorized for detection and/or diagnosis of SARS-CoV-2 by FDA under an Emergency Use Authorization (EUA). This EUA will remain  in effect (meaning this test can be used) for the duration of the COVID-19 declaration under Section 56 4(b)(1) of the Act, 21 U.S.C. section 360bbb-3(b)(1), unless the authorization is terminated or revoked sooner. Performed at [facility], [place] [address] [place], [place] [address]     Lab Basic Metabolic Panel: Recent Labs  Lab 07/06/19 0221 06/16/2019 0928 06/13/2019 0944  NA 141 134* 132*  K 3.3* 4.6 4.6  CL 115* 103  --   CO2 19* 16*  --   GLUCOSE 121* 195*  --   BUN 13  18  --   CREATININE 0.64 1.16  --   CALCIUM 7.8* 8.2*  --   MG 1.5*  --   --   PHOS 3.3  --   --    Liver Function Tests: Recent Labs  Lab 06/23/2019 0928  AST 28  ALT 18  ALKPHOS 101  BILITOT 0.7  PROT 5.3*  ALBUMIN 2.1*   No results for input(s): LIPASE, AMYLASE in the last 168 hours. No results for input(s): AMMONIA in the last 168 hours. CBC: Recent Labs  Lab 07/05/19 1210 07/06/19 0221 06/25/2019 0928 06/25/2019 0944  WBC 7.1 8.9 14.7*  --   NEUTROABS 4.5 5.8 8.8*  --   HGB 8.7* 8.4* 8.6* 8.2*  HCT 25.9* 24.6* 26.8* 24.0*  MCV 89.0 87.9 92.7  --   PLT 213 216 365  --    Cardiac Enzymes: No results for input(s): CKTOTAL, CKMB, CKMBINDEX, TROPONINI in the last 168 hours. Sepsis Labs: Recent Labs  Lab 07/05/19 1210 07/06/19 0221 07/03/2019 0928 06/18/2019 1034  WBC 7.1 8.9 14.7*  --   LATICACIDVEN  --   --   --  4.9*    Procedures/Operations  None   Sharrell Krawiec 08-01-2019, 9:15 PM

## 2019-07-13 NOTE — Progress Notes (Addendum)
Daily Progress Note   Patient Name: Billy [name]       Date: 15-Jul-2019 DOB: 1926-12-10  Age: 85 y.o. MRN#: [id number] Attending Physician: Billy Ford, DO Primary Care Physician: Billy [name] Admit Date: 06/18/2019  Reason for Consultation/Follow-up: Establishing goals of care  Subjective: Patient opens eyes to voice and can tell me his name. He does not appear to be in distress or discomfort, but appears to have myoclonus with constant jerking of all extremities. Vitals are stable. He is 93% on room air.  GOC:  Spoke with son, Billy [name] via telephone. He will be at the hospital soon. Billy [name] reports that baseline, his father does not have tremors/jerking. Explained that this is likely coming from morphine with elevated creatinine. Recommended switching medications, which son agrees with. Reviewed symptom management medications and confirmed focus on comfort measures only. Discussed good oral care and comfort sips/bits if patient is awake and requesting, explaining risk for aspiration and aspiration precautions. Billy [name] confirms understanding. Billy [name] shares that his sister is coming from [place] today. Billy [name] has PMT contact information.  1050-1110: F/u with children, Billy [name] and Billy [name] at bedside. Discussed symptom management plan and EOL expectations. Answered all questions. PMT contact information given. Patient appears more comfortable with dilaudid 0.5mg  bolus x2 and initiation of dilaudid infusion.   Length of Stay: 0  Current Medications: Scheduled Meds:  .  HYDROmorphone (DILAUDID) injection  0.5 mg Intravenous Once    Continuous Infusions: . HYDROmorphone      PRN Meds: acetaminophen **OR** acetaminophen, antiseptic oral rinse, bisacodyl, glycopyrrolate **OR** glycopyrrolate **OR**  glycopyrrolate, haloperidol **OR** haloperidol **OR** haloperidol lactate, HYDROmorphone, LORazepam **OR** LORazepam **OR** LORazepam, ondansetron **OR** ondansetron (ZOFRAN) IV, polyvinyl alcohol, senna, sodium phosphate  Physical Exam Vitals signs and nursing note reviewed.  Constitutional:      Appearance: He is ill-appearing.  Cardiovascular:     Heart sounds: Normal heart sounds.  Pulmonary:     Effort: No tachypnea, accessory muscle usage or respiratory distress.     Comments: Comfortable on room air Abdominal:     Tenderness: There is no abdominal tenderness.  Skin:    General: Skin is warm and dry.  Neurological:     Mental Status: He is easily aroused.     Comments: Constant jerking/myoclonus. Opens eyes to voice. Knows his name. Otherwise, disoriented  Vital Signs: BP (!) 91/45 (BP Location: Left Arm)   Pulse 98   Temp 97.9 F (36.6 C) (Oral)   Resp 16   SpO2 93%  SpO2: SpO2: 93 % O2 Device: O2 Device: Room Air O2 Flow Rate:    Intake/output summary:   Intake/Output Summary (Last 24 hours) at 07/15/19 0959 Last data filed at 07-15-2019 0700 Gross per 24 hour  Intake 1035.99 ml  Output -  Net 1035.99 ml   LBM: Last BM Date: 07/07/19 Baseline Weight:   Most recent weight:         Palliative Assessment/Data: PPS 20%   Flowsheet Rows     Most Recent Value  Intake Tab  Referral Department  Hospitalist  Unit at Time of Referral  ER  Palliative Care Primary Diagnosis  Sepsis/Infectious Disease  Date Notified  06/28/2019  Palliative Care Type  New Palliative care  Date of Admission  06/15/2019  Date first seen by Palliative Care  06/21/2019  # of days Palliative referral response time  0 Day(s)  # of days IP prior to Palliative referral  0  Clinical Assessment  Palliative Performance Scale Score  20%  Pain Max last 24 hours  Not able to report  Pain Min Last 24 hours  Not able to report  Dyspnea Max Last 24 Hours  Not able to report  Dyspnea Min  Last 24 hours  Not able to report  Psychosocial & Spiritual Assessment  Palliative Care Outcomes      Patient Active Problem List   Diagnosis Date Noted  . Comfort measures only status 06/29/2019  . Severe sepsis (Billy [name]) 07/08/2019  . Lactic acidosis 06/30/2019  . Elevated troponin 06/16/2019  . Goals of care, counseling/discussion   . Palliative care by specialist   . End of life care   . Obstructive uropathy 07/07/2019  . Aspiration pneumonia (Billy [name]) 07/07/2019  . Insomnia 07/07/2019  . Hypokalemia 07/07/2019  . Hypomagnesemia 07/07/2019  . Psychosis (Billy [name]) 07/07/2019  . Vascular dementia (Billy [name]) 07/07/2019  . Upper GI bleed 06/30/2019  . Constipation 06/30/2019  . AKI (acute kidney injury) ([place]) 06/30/2019  . Hypotension 06/30/2019  . TIA (transient ischemic attack) 12/23/2017  . Stroke-like symptoms 12/22/2017  . Overactive bladder   . Hypertension   . Hypercholesterolemia   . Dementia (Billy [name])   . Pituitary mass Billy [name])     Palliative Care Assessment & Plan   Patient Profile:  85 y.o. male  with past medical history of *HTN, HLD, dementia, TIA, and bladder and prostate cancers/ptransurethral resection, resident of [name] admitted on 06/13/2019 with severe sepsis secondary to aspiration pneumonia.  Assessment: Acute respiratory distress secondary to possible aspiration event Sepsis/shock  Baseline dementia Hypotension  Recommendations/Plan:  Comfort measures only.   Symptom management  Pt appears to have myoclonus on morphine gtt with elevated creat. Will switch to dilaudid gtt. RN to initiate at 0.5mg /hr. RN may bolus dilaudid via infusion 0.5mg  q97min prn breakthrough pain/dyspnea/air hunger. RN to give Dilaudid 0.5mg  IV push now while waiting on pharmacy to send dilaudid gtt.  Continue prn haldol, robinul, ativan  Continue frequent oral care.  Comfort feeds per patient/family request.  Unrestricted visitor access to allow family to visit patient as he  nears EOL.  Code Status: DNR   Code Status Orders  (From admission, onward)         Start     Ordered   07/10/2019 1513  Do not attempt resuscitation (DNR)  Continuous    Question  Answer Comment  In the event of cardiac or respiratory ARREST Do not call a "code blue"   In the event of cardiac or respiratory ARREST Do not perform Intubation, CPR, defibrillation or ACLS   In the event of cardiac or respiratory ARREST Use medication by any route, position, wound care, and other measures to relive pain and suffering. May use oxygen, suction and manual treatment of airway obstruction as needed for comfort.      06/18/2019 1512        Code Status History    Date Active Date Inactive Code Status Order ID Comments User Context   06/27/2019 1059 06/18/2019 1512 DNR [id number]  Deno Etienne, DO ED   06/30/2019 2320 07/07/2019 1732 DNR [id number]  Orene Desanctis, DO ED   12/22/2017 1749 12/23/2017 2202 DNR [id number]  Radene Gunning, NP ED   12/22/2017 1711 12/22/2017 1749 Full Code [id number]  Black, Lezlie Octave, NP ED   Advance Care Planning Activity    Advance Directive Documentation     Most Recent Value  Type of Advance Directive  Living will  Pre-existing out of facility DNR order (yellow form or pink MOST form)  -  "MOST" Form in Place?  -       Prognosis:   Hours - Days  Discharge Planning:  To Be Determined: Possible he could pass inpatient.  Care plan was discussed with son Billy [name]), daughter Sharee Pimple), RN, Dr. Ree Kida  Thank you for allowing the Palliative Medicine Team to assist in the care of this patient.   Time In: [id number] Time Out: 1005 1110 Total Time 50 Prolonged Time Billed no      Greater than 50%  of this time was spent counseling and coordinating care related to the above assessment and plan.  Ihor Dow, DNP, FNP-C Palliative Medicine Team  Phone: [phone] Fax: [phone]  Please contact Palliative Medicine Team phone at [phone] for questions and concerns.

## 2019-07-13 NOTE — Progress Notes (Signed)
Patient deceased. Unused Dilaudid 25mg  in NS 50 mL returned to pharmacy.  Med wasted in hazardous waste bin by Gertie Exon, Rph witnessed by Sheppard Penton on 07/12/19 at 10:30am.

## 2019-07-13 DEATH — deceased

## 2021-02-03 IMAGING — CR DG CHEST 2V
2 series · 2 of 2 positions shown · non-contrast
Comparison: March 18, 2019.

CLINICAL DATA: Shortness of breath, cough.

EXAM:
CHEST - 2 VIEW

[chest lat]
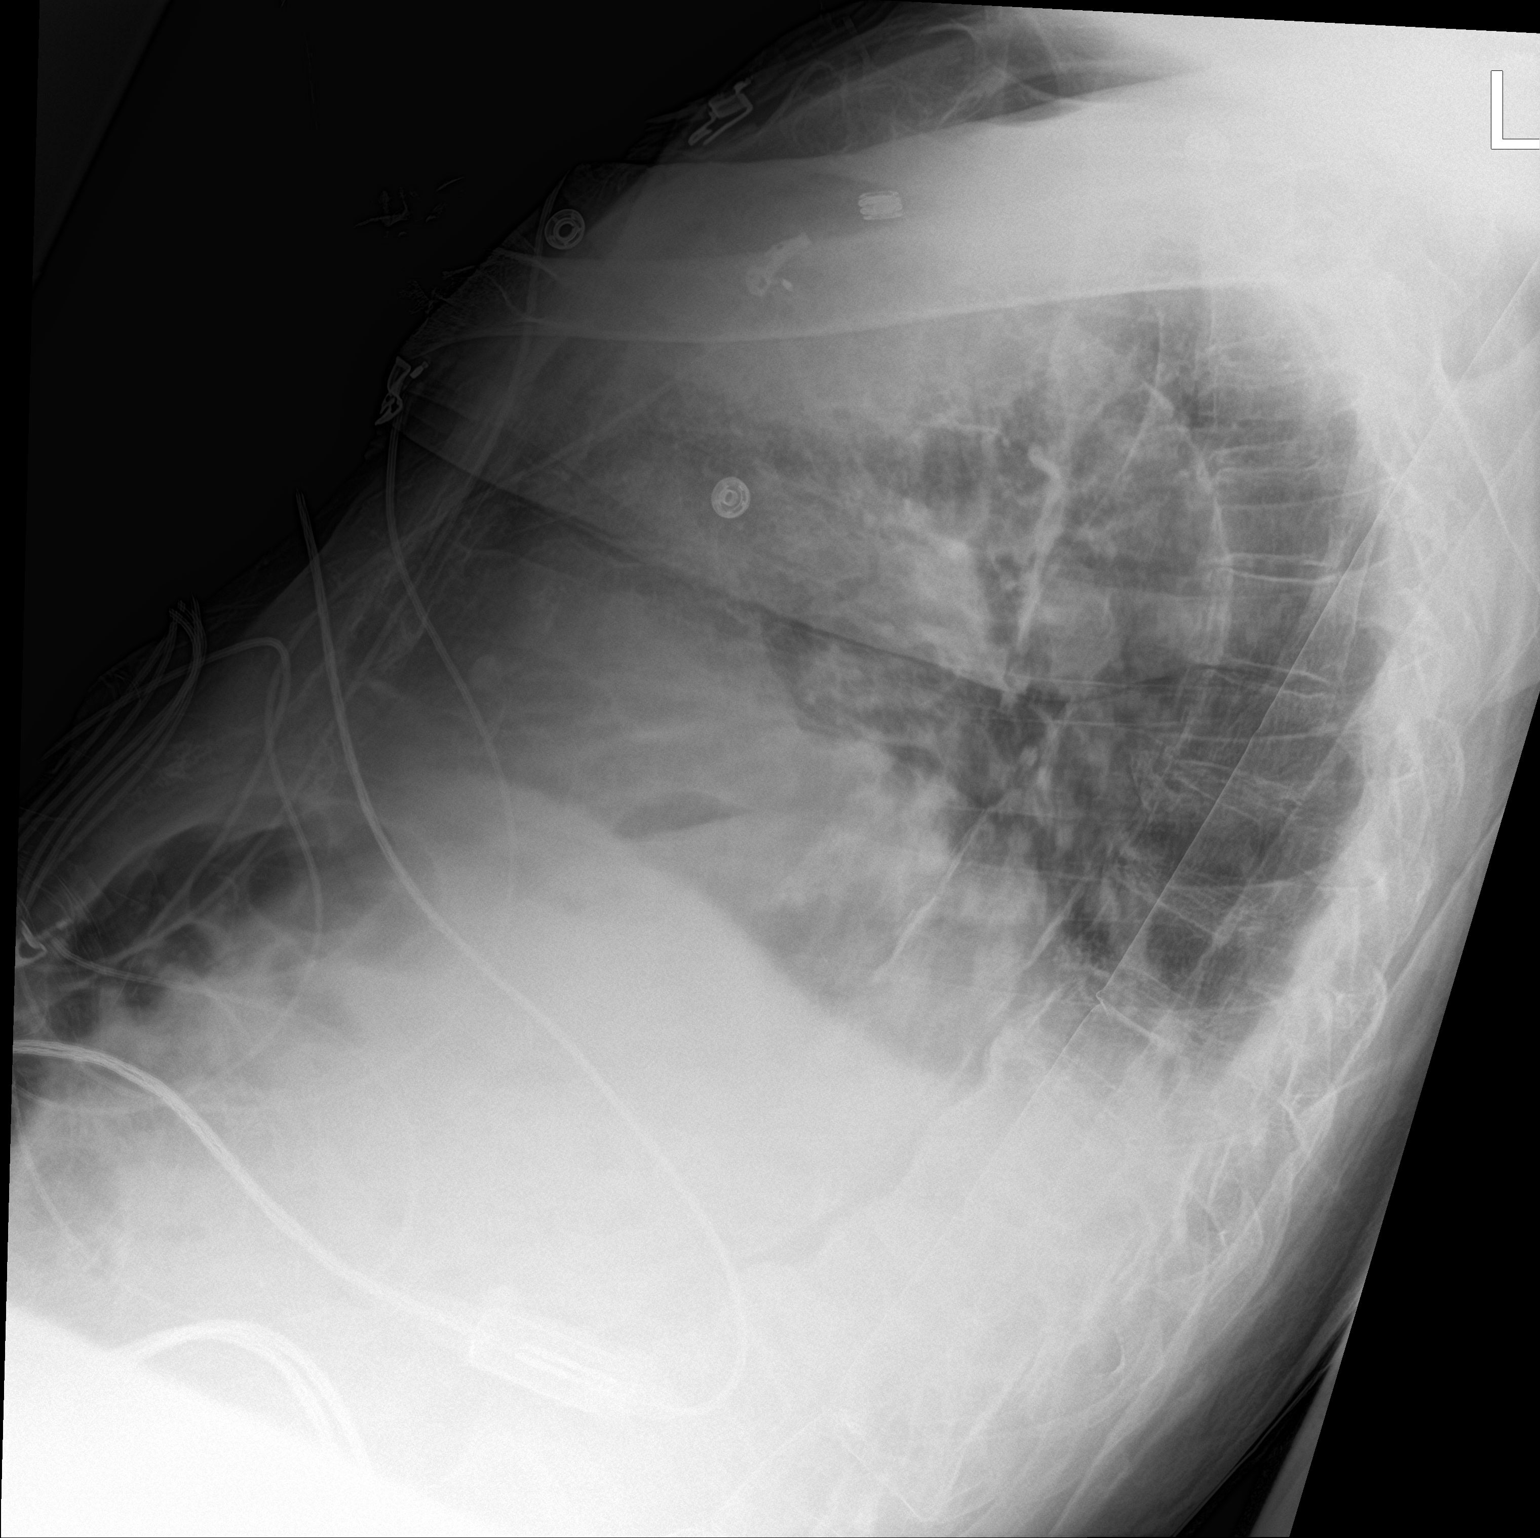

[chest ap]
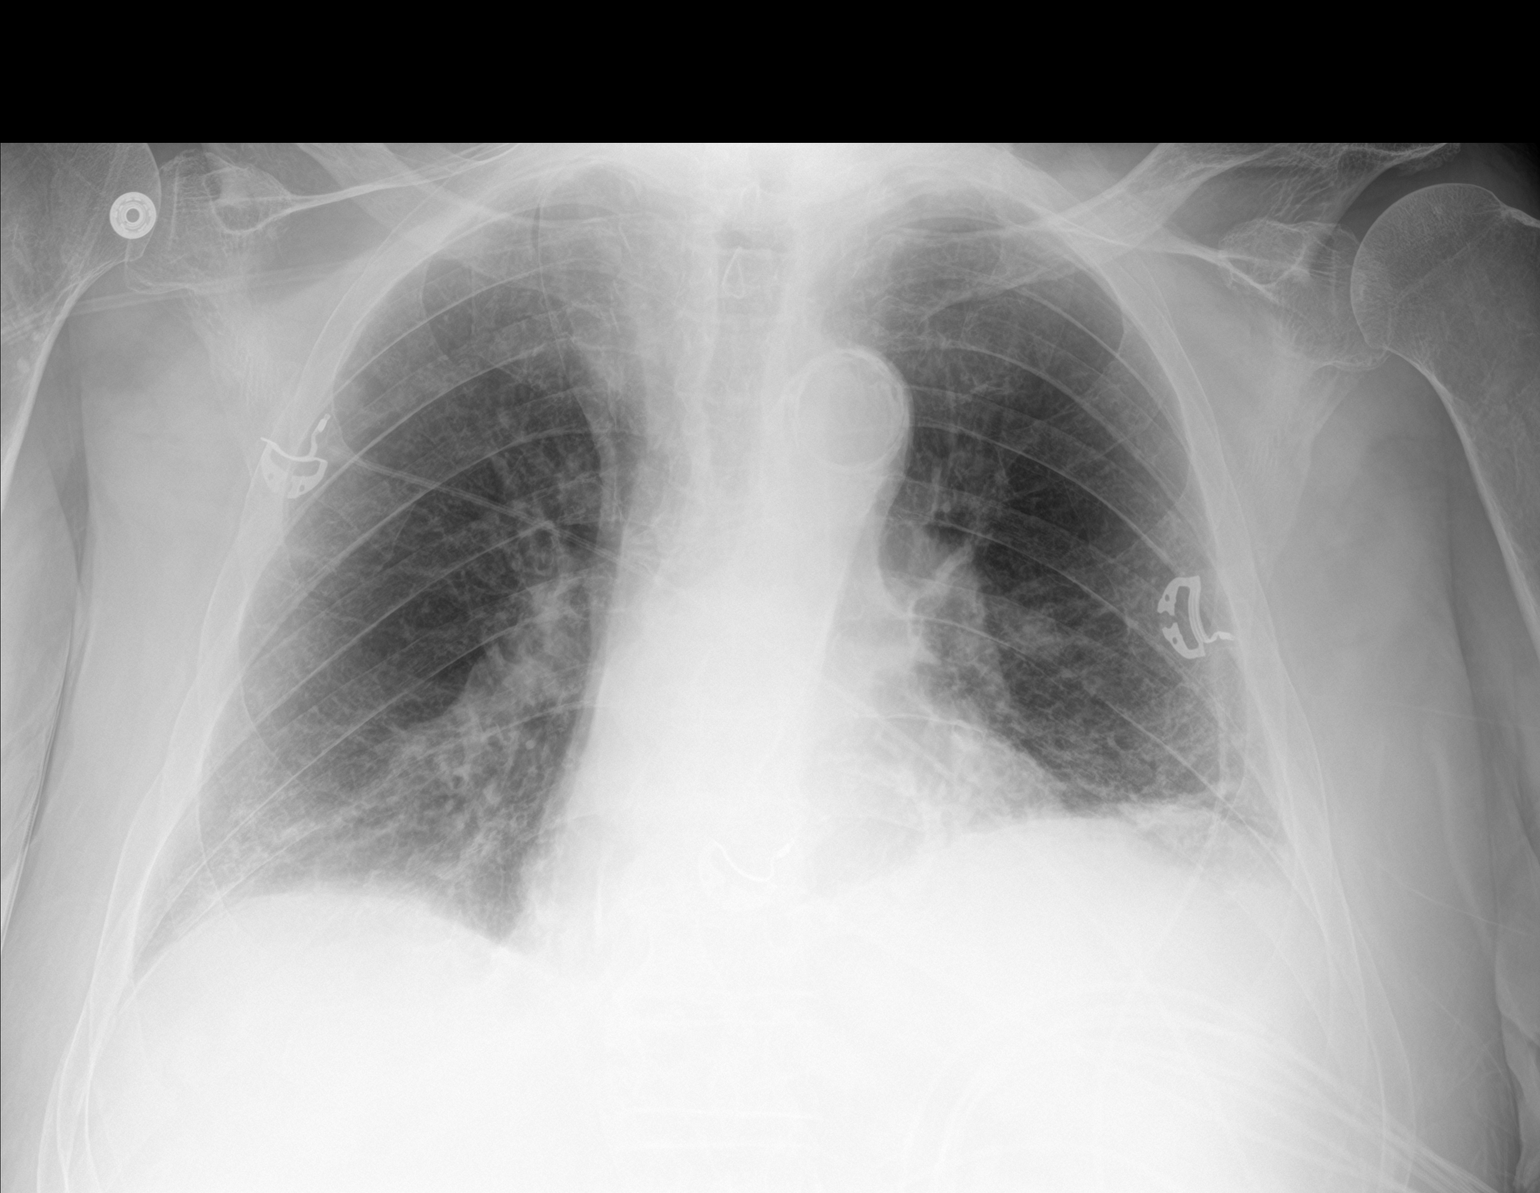

[2 of 2 positions shown; findings below may reference images not displayed]

FINDINGS: Stable cardiomediastinal silhouette. Atherosclerosis of thoracic
aorta is noted no pneumothorax is noted. Mild bibasilar subsegmental
atelectasis is noted with small bilateral pleural effusions. Bony
thorax is unremarkable.
IMPRESSION: Mild bibasilar subsegmental atelectasis is noted with probable small
pleural effusions.

Aortic Atherosclerosis ([id number]).
